# Patient Record
Sex: Female | Born: 1990 | Race: White | Hispanic: No | Marital: Married | Smoking: Never smoker
Health system: Southern US, Community
[De-identification: ages and names within clinical notes are randomized; demographics above are authoritative.]

## PROBLEM LIST (undated history)

## (undated) DIAGNOSIS — Z9889 Other specified postprocedural states: Secondary | ICD-10-CM

## (undated) DIAGNOSIS — K209 Esophagitis, unspecified without bleeding: Secondary | ICD-10-CM

## (undated) DIAGNOSIS — M1990 Unspecified osteoarthritis, unspecified site: Secondary | ICD-10-CM

## (undated) DIAGNOSIS — F32A Depression, unspecified: Secondary | ICD-10-CM

## (undated) DIAGNOSIS — T40605A Adverse effect of unspecified narcotics, initial encounter: Secondary | ICD-10-CM

## (undated) DIAGNOSIS — I499 Cardiac arrhythmia, unspecified: Secondary | ICD-10-CM

## (undated) DIAGNOSIS — E282 Polycystic ovarian syndrome: Secondary | ICD-10-CM

## (undated) DIAGNOSIS — J45909 Unspecified asthma, uncomplicated: Secondary | ICD-10-CM

## (undated) DIAGNOSIS — S60519A Abrasion of unspecified hand, initial encounter: Secondary | ICD-10-CM

## (undated) DIAGNOSIS — I7300 Raynaud's syndrome without gangrene: Secondary | ICD-10-CM

## (undated) DIAGNOSIS — Z889 Allergy status to unspecified drugs, medicaments and biological substances status: Secondary | ICD-10-CM

## (undated) DIAGNOSIS — T4145XA Adverse effect of unspecified anesthetic, initial encounter: Secondary | ICD-10-CM

## (undated) DIAGNOSIS — R740 Nonspecific elevation of levels of transaminase and lactic acid dehydrogenase [LDH]: Secondary | ICD-10-CM

## (undated) DIAGNOSIS — G5620 Lesion of ulnar nerve, unspecified upper limb: Secondary | ICD-10-CM

## (undated) DIAGNOSIS — F419 Anxiety disorder, unspecified: Secondary | ICD-10-CM

## (undated) DIAGNOSIS — E782 Mixed hyperlipidemia: Secondary | ICD-10-CM

## (undated) DIAGNOSIS — I471 Supraventricular tachycardia, unspecified: Secondary | ICD-10-CM

## (undated) DIAGNOSIS — G2581 Restless legs syndrome: Secondary | ICD-10-CM

## (undated) DIAGNOSIS — R112 Nausea with vomiting, unspecified: Secondary | ICD-10-CM

## (undated) DIAGNOSIS — Z8719 Personal history of other diseases of the digestive system: Secondary | ICD-10-CM

## (undated) DIAGNOSIS — E669 Obesity, unspecified: Secondary | ICD-10-CM

## (undated) DIAGNOSIS — E7800 Pure hypercholesterolemia, unspecified: Secondary | ICD-10-CM

## (undated) DIAGNOSIS — Z9109 Other allergy status, other than to drugs and biological substances: Secondary | ICD-10-CM

## (undated) DIAGNOSIS — F329 Major depressive disorder, single episode, unspecified: Secondary | ICD-10-CM

## (undated) DIAGNOSIS — K219 Gastro-esophageal reflux disease without esophagitis: Secondary | ICD-10-CM

## (undated) DIAGNOSIS — M25559 Pain in unspecified hip: Secondary | ICD-10-CM

## (undated) DIAGNOSIS — T8859XA Other complications of anesthesia, initial encounter: Secondary | ICD-10-CM

## (undated) DIAGNOSIS — O24419 Gestational diabetes mellitus in pregnancy, unspecified control: Secondary | ICD-10-CM

## (undated) HISTORY — DX: Gestational diabetes mellitus in pregnancy, unspecified control: O24.419

## (undated) HISTORY — DX: Mixed hyperlipidemia: E78.2

## (undated) HISTORY — DX: Esophagitis, unspecified without bleeding: K20.90

## (undated) HISTORY — PX: IUD REMOVAL: SHX5392

## (undated) HISTORY — PX: A-FLUTTER ABLATION: EP1230

## (undated) HISTORY — PX: ABLATION: SHX5711

## (undated) HISTORY — DX: Nonspecific elevation of levels of transaminase and lactic acid dehydrogenase (ldh): R74.0

## (undated) HISTORY — DX: Obesity, unspecified: E66.9

## (undated) HISTORY — DX: Polycystic ovarian syndrome: E28.2

## (undated) HISTORY — PX: INTRAUTERINE DEVICE (IUD) INSERTION: SHX5877

---

## 2011-07-07 HISTORY — PX: HIP SURGERY: SHX245

## 2013-07-26 ENCOUNTER — Other Ambulatory Visit: Payer: Self-pay | Admitting: Orthopedic Surgery

## 2013-08-31 DIAGNOSIS — M25559 Pain in unspecified hip: Secondary | ICD-10-CM

## 2013-08-31 DIAGNOSIS — G5620 Lesion of ulnar nerve, unspecified upper limb: Secondary | ICD-10-CM

## 2013-08-31 HISTORY — DX: Pain in unspecified hip: M25.559

## 2013-08-31 HISTORY — DX: Lesion of ulnar nerve, unspecified upper limb: G56.20

## 2013-09-09 ENCOUNTER — Encounter (HOSPITAL_BASED_OUTPATIENT_CLINIC_OR_DEPARTMENT_OTHER): Payer: Self-pay | Admitting: *Deleted

## 2013-09-09 DIAGNOSIS — S60519A Abrasion of unspecified hand, initial encounter: Secondary | ICD-10-CM

## 2013-09-09 HISTORY — DX: Abrasion of unspecified hand, initial encounter: S60.519A

## 2013-09-09 NOTE — Pre-Procedure Instructions (Addendum)
Newly diagnosed SVT discussed with Dr. Sampson GoonFitzgerald; pt. needs to finish with cardiac workup prior to having elbow surgery.  [facility] at [facility] notified.  Pt. notified of same.

## 2013-09-11 NOTE — Pre-Procedure Instructions (Signed)
Echo received and discussed with Dr. Sampson GoonFitzgerald; would like to know if pt. is still having episodes of SVT on the beta blocker; pt. contacted, and states she has not had any episodes since she was seen in the ER and started on Metoprolol 08/15/2013.  Pt. OK to come for surgery.

## 2013-09-12 ENCOUNTER — Encounter (HOSPITAL_BASED_OUTPATIENT_CLINIC_OR_DEPARTMENT_OTHER)
Admission: RE | Admit: 2013-09-12 | Discharge: 2013-09-12 | Disposition: A | Discharge status: Home / Self Care | Payer: 59 | Source: Ambulatory Visit | Attending: Orthopedic Surgery | Admitting: Orthopedic Surgery

## 2013-09-12 DIAGNOSIS — F3289 Other specified depressive episodes: Secondary | ICD-10-CM | POA: Diagnosis not present

## 2013-09-12 DIAGNOSIS — F411 Generalized anxiety disorder: Secondary | ICD-10-CM | POA: Diagnosis not present

## 2013-09-12 DIAGNOSIS — Z79899 Other long term (current) drug therapy: Secondary | ICD-10-CM | POA: Diagnosis not present

## 2013-09-12 DIAGNOSIS — K219 Gastro-esophageal reflux disease without esophagitis: Secondary | ICD-10-CM | POA: Diagnosis not present

## 2013-09-12 DIAGNOSIS — G5620 Lesion of ulnar nerve, unspecified upper limb: Secondary | ICD-10-CM | POA: Diagnosis present

## 2013-09-12 DIAGNOSIS — F329 Major depressive disorder, single episode, unspecified: Secondary | ICD-10-CM | POA: Diagnosis not present

## 2013-09-12 DIAGNOSIS — K449 Diaphragmatic hernia without obstruction or gangrene: Secondary | ICD-10-CM | POA: Diagnosis not present

## 2013-09-12 LAB — BASIC METABOLIC PANEL
ANION GAP: 11 (ref 5–15)
BUN: 13 mg/dL (ref 6–23)
CALCIUM: 9.5 mg/dL (ref 8.4–10.5)
CHLORIDE: 102 meq/L (ref 96–112)
CO2: 27 mEq/L (ref 19–32)
CREATININE: 0.71 mg/dL (ref 0.50–1.10)
GFR calc Af Amer: >90 mL/min (ref >90)
GFR calc non Af Amer: >90 mL/min (ref >90)
GLUCOSE: 108 mg/dL — AB (ref 70–99)
Potassium: 4.9 mEq/L (ref 3.7–5.3)
Sodium: 140 mEq/L (ref 137–147)

## 2013-09-12 NOTE — Progress Notes (Signed)
Dr. Krista BlueSinger talked with patient. Dr. Sampson GoonFitzgerald reviewed ER report from [facility] [place] for [place]

## 2013-09-13 ENCOUNTER — Ambulatory Visit (HOSPITAL_BASED_OUTPATIENT_CLINIC_OR_DEPARTMENT_OTHER)
Admission: RE | Admit: 2013-09-13 | Discharge: 2013-09-13 | Disposition: A | Discharge status: Home / Self Care | Payer: 59 | Source: Ambulatory Visit | Attending: Orthopedic Surgery | Admitting: Orthopedic Surgery

## 2013-09-13 ENCOUNTER — Encounter (HOSPITAL_BASED_OUTPATIENT_CLINIC_OR_DEPARTMENT_OTHER): Payer: Self-pay | Admitting: *Deleted

## 2013-09-13 ENCOUNTER — Encounter (HOSPITAL_BASED_OUTPATIENT_CLINIC_OR_DEPARTMENT_OTHER): Payer: 59 | Admitting: Anesthesiology

## 2013-09-13 ENCOUNTER — Encounter (HOSPITAL_BASED_OUTPATIENT_CLINIC_OR_DEPARTMENT_OTHER)
Admission: RE | Disposition: A | Discharge status: Home / Self Care | Payer: Self-pay | Source: Ambulatory Visit | Attending: Orthopedic Surgery

## 2013-09-13 ENCOUNTER — Ambulatory Visit (HOSPITAL_BASED_OUTPATIENT_CLINIC_OR_DEPARTMENT_OTHER): Payer: 59 | Admitting: Anesthesiology

## 2013-09-13 DIAGNOSIS — K449 Diaphragmatic hernia without obstruction or gangrene: Secondary | ICD-10-CM | POA: Insufficient documentation

## 2013-09-13 DIAGNOSIS — Z79899 Other long term (current) drug therapy: Secondary | ICD-10-CM | POA: Insufficient documentation

## 2013-09-13 DIAGNOSIS — F3289 Other specified depressive episodes: Secondary | ICD-10-CM | POA: Insufficient documentation

## 2013-09-13 DIAGNOSIS — F329 Major depressive disorder, single episode, unspecified: Secondary | ICD-10-CM | POA: Insufficient documentation

## 2013-09-13 DIAGNOSIS — F411 Generalized anxiety disorder: Secondary | ICD-10-CM | POA: Insufficient documentation

## 2013-09-13 DIAGNOSIS — K219 Gastro-esophageal reflux disease without esophagitis: Secondary | ICD-10-CM | POA: Insufficient documentation

## 2013-09-13 DIAGNOSIS — G5620 Lesion of ulnar nerve, unspecified upper limb: Secondary | ICD-10-CM | POA: Diagnosis not present

## 2013-09-13 HISTORY — PX: ULNAR NERVE TRANSPOSITION: SHX2595

## 2013-09-13 HISTORY — DX: Other complications of anesthesia, initial encounter: T88.59XA

## 2013-09-13 HISTORY — DX: Personal history of other diseases of the digestive system: Z87.19

## 2013-09-13 HISTORY — DX: Anxiety disorder, unspecified: F41.9

## 2013-09-13 HISTORY — DX: Pain in unspecified hip: M25.559

## 2013-09-13 HISTORY — DX: Lesion of ulnar nerve, unspecified upper limb: G56.20

## 2013-09-13 HISTORY — DX: Major depressive disorder, single episode, unspecified: F32.9

## 2013-09-13 HISTORY — DX: Other specified postprocedural states: Z98.890

## 2013-09-13 HISTORY — DX: Gastro-esophageal reflux disease without esophagitis: K21.9

## 2013-09-13 HISTORY — DX: Adverse effect of unspecified narcotics, initial encounter: T40.605A

## 2013-09-13 HISTORY — DX: Abrasion of unspecified hand, initial encounter: S60.519A

## 2013-09-13 HISTORY — DX: Supraventricular tachycardia, unspecified: I47.10

## 2013-09-13 HISTORY — DX: Nausea with vomiting, unspecified: R11.2

## 2013-09-13 HISTORY — DX: Supraventricular tachycardia: I47.1

## 2013-09-13 HISTORY — DX: Allergy status to unspecified drugs, medicaments and biological substances: Z88.9

## 2013-09-13 HISTORY — DX: Adverse effect of unspecified anesthetic, initial encounter: T41.45XA

## 2013-09-13 HISTORY — DX: Pure hypercholesterolemia, unspecified: E78.00

## 2013-09-13 HISTORY — DX: Other allergy status, other than to drugs and biological substances: Z91.09

## 2013-09-13 HISTORY — DX: Restless legs syndrome: G25.81

## 2013-09-13 HISTORY — DX: Cardiac arrhythmia, unspecified: I49.9

## 2013-09-13 HISTORY — DX: Depression, unspecified: F32.A

## 2013-09-13 LAB — POCT HEMOGLOBIN-HEMACUE: Hemoglobin: 14.1 g/dL (ref 12.0–15.0)

## 2013-09-13 SURGERY — ULNAR NERVE DECOMPRESSION/TRANSPOSITION
Anesthesia: Regional | Site: Elbow | Laterality: Right

## 2013-09-13 MED ORDER — LACTATED RINGERS IV SOLN
INTRAVENOUS | Status: DC
  Administered 2013-09-13 (×2): via INTRAVENOUS

## 2013-09-13 MED ORDER — HYDROMORPHONE HCL PF 1 MG/ML IJ SOLN
INTRAMUSCULAR | Status: AC
  Filled 2013-09-13: qty 1

## 2013-09-13 MED ORDER — LIDOCAINE HCL (CARDIAC) 20 MG/ML IV SOLN
INTRAVENOUS | Status: DC | PRN
  Administered 2013-09-13: 75 mg via INTRAVENOUS

## 2013-09-13 MED ORDER — FENTANYL CITRATE 0.05 MG/ML IJ SOLN
INTRAMUSCULAR | Status: DC | PRN
  Administered 2013-09-13 (×2): 25 ug via INTRAVENOUS
  Administered 2013-09-13 (×2): 50 ug via INTRAVENOUS

## 2013-09-13 MED ORDER — MIDAZOLAM HCL 2 MG/2ML IJ SOLN
1.0000 mg | INTRAMUSCULAR | Status: DC | PRN
  Administered 2013-09-13: 2 mg via INTRAVENOUS

## 2013-09-13 MED ORDER — MIDAZOLAM HCL 5 MG/5ML IJ SOLN
INTRAMUSCULAR | Status: DC | PRN
  Administered 2013-09-13: 2 mg via INTRAVENOUS

## 2013-09-13 MED ORDER — SODIUM CHLORIDE 0.45 % IV SOLN: INTRAVENOUS | Status: DC

## 2013-09-13 MED ORDER — TAPENTADOL HCL 50 MG PO TABS: 100.0000 mg | ORAL_TABLET | ORAL | Status: DC | PRN

## 2013-09-13 MED ORDER — METHOCARBAMOL 500 MG PO TABS
500.0000 mg | ORAL_TABLET | Freq: Once | ORAL | Status: AC
  Administered 2013-09-13: 500 mg via ORAL
  Filled 2013-09-13: qty 1

## 2013-09-13 MED ORDER — PROMETHAZINE HCL 25 MG/ML IJ SOLN
INTRAMUSCULAR | Status: AC
  Filled 2013-09-13: qty 1

## 2013-09-13 MED ORDER — METHOCARBAMOL 500 MG PO TABS: 500.0000 mg | ORAL_TABLET | Freq: Four times a day (QID) | ORAL | Status: DC

## 2013-09-13 MED ORDER — PROPOFOL 10 MG/ML IV BOLUS
INTRAVENOUS | Status: DC | PRN
  Administered 2013-09-13: 200 mg via INTRAVENOUS

## 2013-09-13 MED ORDER — HYDROMORPHONE HCL PF 1 MG/ML IJ SOLN
0.2500 mg | INTRAMUSCULAR | Status: DC | PRN
  Administered 2013-09-13 (×2): 0.25 mg via INTRAVENOUS

## 2013-09-13 MED ORDER — CHLORHEXIDINE GLUCONATE 4 % EX LIQD: 60.0000 mL | Freq: Once | CUTANEOUS | Status: DC

## 2013-09-13 MED ORDER — DEXAMETHASONE SODIUM PHOSPHATE 10 MG/ML IJ SOLN
INTRAMUSCULAR | Status: DC | PRN
  Administered 2013-09-13: 10 mg via INTRAVENOUS

## 2013-09-13 MED ORDER — MIDAZOLAM HCL 2 MG/2ML IJ SOLN
INTRAMUSCULAR | Status: AC
  Filled 2013-09-13: qty 2

## 2013-09-13 MED ORDER — BUPIVACAINE-EPINEPHRINE (PF) 0.5% -1:200000 IJ SOLN
INTRAMUSCULAR | Status: DC | PRN
  Administered 2013-09-13: 30 mL via PERINEURAL

## 2013-09-13 MED ORDER — FENTANYL CITRATE 0.05 MG/ML IJ SOLN
50.0000 ug | INTRAMUSCULAR | Status: DC | PRN
  Administered 2013-09-13: 100 ug via INTRAVENOUS

## 2013-09-13 MED ORDER — SCOPOLAMINE 1 MG/3DAYS TD PT72
MEDICATED_PATCH | TRANSDERMAL | Status: AC
  Filled 2013-09-13: qty 1

## 2013-09-13 MED ORDER — ONDANSETRON HCL 4 MG/2ML IJ SOLN: 4.0000 mg | Freq: Four times a day (QID) | INTRAMUSCULAR | Status: DC | PRN

## 2013-09-13 MED ORDER — ONDANSETRON HCL 4 MG/2ML IJ SOLN
INTRAMUSCULAR | Status: DC | PRN
  Administered 2013-09-13: 4 mg via INTRAVENOUS

## 2013-09-13 MED ORDER — PROMETHAZINE HCL 25 MG/ML IJ SOLN
6.2500 mg | Freq: Once | INTRAMUSCULAR | Status: AC
  Administered 2013-09-13: 6.25 mg via INTRAVENOUS

## 2013-09-13 MED ORDER — SCOPOLAMINE 1 MG/3DAYS TD PT72
1.0000 | MEDICATED_PATCH | Freq: Once | TRANSDERMAL | Status: DC
  Administered 2013-09-13: 1.5 mg via TRANSDERMAL

## 2013-09-13 MED ORDER — CEFAZOLIN SODIUM-DEXTROSE 2-3 GM-% IV SOLR: 2.0000 g | INTRAVENOUS | Status: DC

## 2013-09-13 MED ORDER — FENTANYL CITRATE 0.05 MG/ML IJ SOLN
INTRAMUSCULAR | Status: AC
  Filled 2013-09-13: qty 2

## 2013-09-13 MED ORDER — MIDAZOLAM HCL 2 MG/ML PO SYRP: 12.0000 mg | ORAL_SOLUTION | Freq: Once | ORAL | Status: DC | PRN

## 2013-09-13 MED ORDER — CEFAZOLIN SODIUM-DEXTROSE 2-3 GM-% IV SOLR
INTRAVENOUS | Status: AC
  Filled 2013-09-13: qty 50

## 2013-09-13 MED ORDER — FENTANYL CITRATE 0.05 MG/ML IJ SOLN
INTRAMUSCULAR | Status: AC
  Filled 2013-09-13: qty 4

## 2013-09-13 SURGICAL SUPPLY — 72 items
BANDAGE ELASTIC 3 VELCRO ST LF (GAUZE/BANDAGES/DRESSINGS) ×4 IMPLANT
BANDAGE ELASTIC 4 VELCRO ST LF (GAUZE/BANDAGES/DRESSINGS) IMPLANT
BANDAGE ELASTIC 6 VELCRO ST LF (GAUZE/BANDAGES/DRESSINGS) ×2 IMPLANT
BLADE CLIPPER SURG (BLADE) IMPLANT
BLADE SURG 15 STRL LF DISP TIS (BLADE) ×3 IMPLANT
BLADE SURG 15 STRL SS (BLADE) ×3
BNDG CONFORM 3 STRL LF (GAUZE/BANDAGES/DRESSINGS) ×2 IMPLANT
BNDG GAUZE ELAST 4 BULKY (GAUZE/BANDAGES/DRESSINGS) ×2 IMPLANT
BRUSH SCRUB EZ PLAIN DRY (MISCELLANEOUS) ×2 IMPLANT
CANISTER SUCT 1200ML W/VALVE (MISCELLANEOUS) IMPLANT
CORDS BIPOLAR (ELECTRODE) ×2 IMPLANT
COVER MAYO STAND STRL (DRAPES) ×2 IMPLANT
COVER TABLE BACK 60X90 (DRAPES) ×2 IMPLANT
CUFF TOURNIQUET SINGLE 18IN (TOURNIQUET CUFF) ×2 IMPLANT
DECANTER SPIKE VIAL GLASS SM (MISCELLANEOUS) IMPLANT
DRAPE EXTREMITY T 121X128X90 (DRAPE) ×2 IMPLANT
DRAPE INCISE IOBAN 66X45 STRL (DRAPES) IMPLANT
DRAPE SURG 17X23 STRL (DRAPES) ×2 IMPLANT
DRSG EMULSION OIL 3X3 NADH (GAUZE/BANDAGES/DRESSINGS) ×2 IMPLANT
EVACUATOR 1/8 PVC DRAIN (DRAIN) IMPLANT
EVACUATOR 3/16  PVC DRAIN (DRAIN)
EVACUATOR 3/16 PVC DRAIN (DRAIN) IMPLANT
EVACUATOR SILICONE 100CC (DRAIN) IMPLANT
GAUZE SPONGE 4X4 12PLY STRL (GAUZE/BANDAGES/DRESSINGS) ×2 IMPLANT
GAUZE SPONGE 4X4 16PLY XRAY LF (GAUZE/BANDAGES/DRESSINGS) IMPLANT
GAUZE XEROFORM 1X8 LF (GAUZE/BANDAGES/DRESSINGS) IMPLANT
GLOVE BIO SURGEON STRL SZ8 (GLOVE) ×2 IMPLANT
GLOVE BIOGEL M STRL SZ7.5 (GLOVE) IMPLANT
GLOVE BIOGEL PI IND STRL 7.0 (GLOVE) ×1 IMPLANT
GLOVE BIOGEL PI INDICATOR 7.0 (GLOVE) ×1
GLOVE ECLIPSE 6.5 STRL STRAW (GLOVE) ×2 IMPLANT
GLOVE SS BIOGEL STRL SZ 8 (GLOVE) ×2 IMPLANT
GLOVE SUPERSENSE BIOGEL SZ 8 (GLOVE) ×2
GOWN STRL REUS W/ TWL LRG LVL3 (GOWN DISPOSABLE) ×1 IMPLANT
GOWN STRL REUS W/ TWL XL LVL3 (GOWN DISPOSABLE) ×1 IMPLANT
GOWN STRL REUS W/TWL LRG LVL3 (GOWN DISPOSABLE) ×1
GOWN STRL REUS W/TWL XL LVL3 (GOWN DISPOSABLE) ×1
LOOP VESSEL MAXI BLUE (MISCELLANEOUS) ×2 IMPLANT
NEEDLE HYPO 22GX1.5 SAFETY (NEEDLE) IMPLANT
NEEDLE HYPO 25X1 1.5 SAFETY (NEEDLE) ×2 IMPLANT
NS IRRIG 1000ML POUR BTL (IV SOLUTION) ×2 IMPLANT
PACK BASIN DAY SURGERY FS (CUSTOM PROCEDURE TRAY) ×2 IMPLANT
PAD CAST 3X4 CTTN HI CHSV (CAST SUPPLIES) ×2 IMPLANT
PADDING CAST ABS 4INX4YD NS (CAST SUPPLIES) ×1
PADDING CAST ABS COTTON 4X4 ST (CAST SUPPLIES) ×1 IMPLANT
PADDING CAST COTTON 3X4 STRL (CAST SUPPLIES) ×2
SHEET MEDIUM DRAPE 40X70 STRL (DRAPES) IMPLANT
SPLINT FAST PLASTER 5X30 (CAST SUPPLIES)
SPLINT FIBERGLASS 4X30 (CAST SUPPLIES) ×2 IMPLANT
SPLINT PLASTER CAST FAST 5X30 (CAST SUPPLIES) IMPLANT
SPLINT PLASTER CAST XFAST 3X15 (CAST SUPPLIES) IMPLANT
SPLINT PLASTER XTRA FASTSET 3X (CAST SUPPLIES)
SPONGE LAP 4X18 X RAY DECT (DISPOSABLE) IMPLANT
STOCKINETTE 4X48 STRL (DRAPES) ×2 IMPLANT
STOCKINETTE SYNTHETIC 3 UNSTER (CAST SUPPLIES) ×2 IMPLANT
STRIP CLOSURE SKIN 1/2X4 (GAUZE/BANDAGES/DRESSINGS) ×2 IMPLANT
SUCTION FRAZIER TIP 10 FR DISP (SUCTIONS) IMPLANT
SUT BONE WAX W31G (SUTURE) IMPLANT
SUT FIBERWIRE 2-0 18 17.9 3/8 (SUTURE)
SUT PROLENE 4 0 PS 2 18 (SUTURE) ×4 IMPLANT
SUT VIC AB 3-0 FS2 27 (SUTURE) ×4 IMPLANT
SUT VIC AB 4-0 P-3 18XBRD (SUTURE) IMPLANT
SUT VIC AB 4-0 P3 18 (SUTURE)
SUTURE FIBERWR 2-0 18 17.9 3/8 (SUTURE) IMPLANT
SYR BULB 3OZ (MISCELLANEOUS) ×2 IMPLANT
SYR CONTROL 10ML LL (SYRINGE) ×2 IMPLANT
TAPE SURG TRANSPORE 1 IN (GAUZE/BANDAGES/DRESSINGS) ×1 IMPLANT
TAPE SURGICAL TRANSPORE 1 IN (GAUZE/BANDAGES/DRESSINGS) ×1
TOWEL OR 17X24 6PK STRL BLUE (TOWEL DISPOSABLE) ×4 IMPLANT
TOWEL OR NON WOVEN STRL DISP B (DISPOSABLE) ×2 IMPLANT
TUBE CONNECTING 20X1/4 (TUBING) IMPLANT
UNDERPAD 30X30 INCONTINENT (UNDERPADS AND DIAPERS) ×2 IMPLANT

## 2013-09-13 NOTE — Progress Notes (Signed)
Assisted Dr. Hodierne with right, ultrasound guided, interscalene  block. Side rails up, monitors on throughout procedure. See vital signs in flow sheet. Tolerated Procedure well. 

## 2013-09-13 NOTE — Anesthesia Procedure Notes (Addendum)
Anesthesia Regional Block:  Supraclavicular block  Pre-Anesthetic Checklist: ,, timeout performed, Correct Patient, Correct Site, Correct Laterality, Correct Procedure, Correct Position, site marked, Risks and benefits discussed,  Surgical consent,  Pre-op evaluation,  At surgeon's request and post-op pain management  Laterality: Right  Prep: chloraprep       Needles:  Injection technique: Single-shot  Needle Type: Echogenic Stimulator Needle     Needle Length: 5cm 5 cm Needle Gauge: 22 and 22 G    Additional Needles:  Procedures: ultrasound guided (picture in chart) and nerve stimulator Supraclavicular block  Nerve Stimulator or Paresthesia:  Response: biceps flexion, 0.45 mA,   Additional Responses:   Narrative:  Start time: 09/13/2013 8:51 AM End time: 09/13/2013 9:00 AM Injection made incrementally with aspirations every 5 mL.  Performed by: Personally  Anesthesiologist: Dr Chaney MallingHodierne  Additional Notes: Functioning IV was confirmed and monitors were applied.  A 50mm 22ga Arrow echogenic stimulator needle was used. Sterile prep and drape,hand hygiene and sterile gloves were used.  Negative aspiration and negative test dose prior to incremental administration of local anesthetic. The patient tolerated the procedure well.  Ultrasound guidance: relevent anatomy identified, needle position confirmed, local anesthetic spread visualized around nerve(s), vascular puncture avoided.  Image printed for medical record.    Procedure Name: LMA Insertion Date/Time: 09/13/2013 9:34 AM Performed by: Gar GibbonKEETON, Skye Plamondon S Pre-anesthesia Checklist: Patient identified, Emergency Drugs available, Suction available and Patient being monitored Patient Re-evaluated:Patient Re-evaluated prior to inductionOxygen Delivery Method: Circle System Utilized Preoxygenation: Pre-oxygenation with 100% oxygen Intubation Type: IV induction Ventilation: Mask ventilation without difficulty LMA: LMA  inserted LMA Size: 4.0 Number of attempts: 1 Airway Equipment and Method: bite block Placement Confirmation: positive ETCO2 Tube secured with: Tape Dental Injury: Teeth and Oropharynx as per pre-operative assessment

## 2013-09-13 NOTE — Discharge Instructions (Signed)
Please call for any problems Please keep your bandage clean and dry Take meds as  needed to for pain  Dr. Carlos LeveringGramig's office will call for your followup  Keep bandage clean and dry.  Call for any problems.  No smoking.  Criteria for driving a car: you should be off your pain medicine for 7-8 hours, able to drive one handed(confident), thinking clearly and feeling able in your judgement to drive. Continue elevation as it will decrease swelling.  If instructed by MD move your fingers within the confines of the bandage/splint.  Use ice if instructed by your MD. Call immediately for any sudden loss of feeling in your hand/arm or change in functional abilities of the extremity.  We recommend that you to take vitamin C 1000 mg a day to promote healing we also recommend that if you require her pain medicine that he take a stool softener to prevent constipation as most pain medicines will have constipation side effects. We recommend either Peri-Colace or Senokot and recommend that you also consider adding MiraLAX to prevent the constipation affects from pain medicine if you are required to use them. These medicines are over the counter and maybe purchased at a local pharmacy.   Post Anesthesia Home Care Instructions  Activity: Get plenty of rest for the remainder of the day. A responsible adult should stay with you for 24 hours following the procedure.  For the next 24 hours, DO NOT: -Drive a car -Advertising copywriterperate machinery -Drink alcoholic beverages -Take any medication unless instructed by your physician -Make any legal decisions or sign important papers.  Meals: Start with liquid foods such as gelatin or soup. Progress to regular foods as tolerated. Avoid greasy, spicy, heavy foods. If nausea and/or vomiting occur, drink only clear liquids until the nausea and/or vomiting subsides. Call your physician if vomiting continues.  Special Instructions/Symptoms: Your throat may feel dry or sore from the anesthesia  or the breathing tube placed in your throat during surgery. If this causes discomfort, gargle with warm salt water. The discomfort should disappear within 24 hours.   Regional Anesthesia Blocks  1. Numbness or the inability to move the "blocked" extremity may last from 3-48 hours after placement. The length of time depends on the medication injected and your individual response to the medication. If the numbness is not going away after 48 hours, call your surgeon.  2. The extremity that is blocked will need to be protected until the numbness is gone and the  Strength has returned. Because you cannot feel it, you will need to take extra care to avoid injury. Because it may be weak, you may have difficulty moving it or using it. You may not know what position it is in without looking at it while the block is in effect.  3. For blocks in the legs and feet, returning to weight bearing and walking needs to be done carefully. You will need to wait until the numbness is entirely gone and the strength has returned. You should be able to move your leg and foot normally before you try and bear weight or walk. You will need someone to be with you when you first try to ensure you do not fall and possibly risk injury.  4. Bruising and tenderness at the needle site are common side effects and will resolve in a few days.  5. Persistent numbness or new problems with movement should be communicated to the surgeon or the [facility]  [phone])/ [facility] [phone]).

## 2013-09-13 NOTE — H&P (Signed)
Christine Schneider is an 23 y.o. female.   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 I've counseled her extensively in regards to the surgical endeavors which will comprise of anterior transposition and release of the ulnar nerve with flexor pronator fascial release  All issues have been discussed. She denies neck back chest or abdominal pain.  Past Medical History  Diagnosis Date  . Anxiety   . Depression   . SVT (supraventricular tachycardia)   . H/O hiatal hernia   . Multiple allergies     states is allergic to everything except ragweed and beef  . Pollen allergy   . High cholesterol     no current med.  . Complication of anesthesia     hair loss after anesthesia  . Restless leg syndrome   . Abrasion of hand 09/09/2013    bilateral  . GERD (gastroesophageal reflux disease)     OTC as needed  . Narcotic-induced nausea and vomiting     states needs Phenergan if Percocet is prescribed  . 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 Hip joint pain 08/2013    right - states has a problem with labrum  . Dysrhythmia     svt  treated  . PONV (postoperative nausea and vomiting)     Past Surgical History  Procedure Laterality Date  . Hip surgery Right 07/07/2011    History reviewed. No pertinent family history. Social History:  reports that she has never smoked. She has never used smokeless tobacco. She reports that she drinks alcohol. She reports that she does not use illicit drugs.  Allergies:  Allergies  Allergen Reactions  . Percocet [Oxycodone-Acetaminophen] Nausea And Vomiting    Medications Prior to Admission  Medication Sig Dispense Refill  . levocetirizine (XYZAL) 5 MG tablet Take 5 mg by mouth every evening.      Marland Kitchen LORazepam (ATIVAN) 1 MG tablet Take 1 mg by mouth every 8 (eight) hours.      . metoprolol succinate  (TOPROL-XL) 25 MG 24 hr tablet Take 25 mg by mouth daily.      . Multiple Vitamin (MULTIVITAMIN) tablet Take 1 tablet by mouth daily.      . pramipexole (MIRAPEX) 0.5 MG tablet Take 0.5 mg by mouth daily.      Marland Kitchen venlafaxine (EFFEXOR) 100 MG tablet Take 100 mg by mouth 2 (two) times daily.      . vitamin B-12 (CYANOCOBALAMIN) 100 MCG tablet Take 100 mcg by mouth daily.        Results for orders placed during the hospital encounter of 09/13/13 (from the past 48 hour(s))  BASIC METABOLIC PANEL     Status: Abnormal   Collection Time    09/12/13  9:00 AM      Result Value Ref Range   Sodium 140  137 - 147 mEq/L   Potassium 4.9  3.7 - 5.3 mEq/L   Chloride 102  96 - 112 mEq/L   CO2 27  19 - 32 mEq/L   Glucose, Bld 108 (*) 70 - 99 mg/dL   BUN 13  6 - 23 mg/dL   Creatinine, Ser 0.71  0.50 - 1.10 mg/dL   Calcium 9.5  8.4 - 10.5 mg/dL   GFR calc non Af Amer >90  >90 mL/min   GFR calc Af Amer >90  >90 mL/min   Comment: (NOTE)  The eGFR has been calculated using the CKD EPI equation.     This calculation has not been validated in all clinical situations.     eGFR's persistently <90 mL/min signify possible Chronic Kidney     Disease.   Anion gap 11  5 - 15  POCT HEMOGLOBIN-HEMACUE     Status: None   Collection Time    09/13/13  8:48 AM      Result Value Ref Range   Hemoglobin 14.1  12.0 - 15.0 g/dL   No results found.  Review of Systems  Cardiovascular: Negative.   Gastrointestinal: Negative.   Genitourinary: Negative.   Endo/Heme/Allergies: Negative.     Blood pressure 133/75, pulse 97, temperature 98.5 F (36.9 C), temperature source Oral, resp. rate 20, height 5' 4"  (1.626 m), weight 94.348 kg (208 lb), last menstrual period 08/21/2013, SpO2 99.00%. Physical Exam right ulnar nerve subluxation at the elbow with notable pain and ulnar nerve neuropathy  She has intact pulses she has intact refill she has no signs of compartment syndrome dystrophy or infection.  I reviewed all  issues with her at length.  The patient is alert and oriented in no acute distress the patient complains of pain in the affected upper extremity.  The patient is noted to have a normal HEENT exam.  Lung fields show equal chest expansion and no shortness of breath  abdomen exam is nontender without distention.  Lower extremity examination does not show any fracture dislocation or blood clot symptoms.  Pelvis is stable neck and back are stable and nontender  Assessment/Plan We will plan for right ulnar nerve decompression anterior transposition and fascial release is necessary.  We are planning surgery for your upper extremity. The risk and benefits of surgery include risk of bleeding infection anesthesia damage to normal structures and failure of the surgery to accomplish its intended goals of relieving symptoms and restoring function with this in mind we'll going to proceed. I have specifically discussed with the patient the pre-and postoperative regime and the does and don'ts and risk and benefits in great detail. Risk and benefits of surgery also include risk of dystrophy chronic nerve pain failure of the healing process to go onto completion and other inherent risks of surgery The relavent the pathophysiology of the disease/injury process, as well as the alternatives for treatment and postoperative course of action has been discussed in great detail with the patient who desires to proceed.  We will do everything in our power to help you (the patient) restore function to the upper extremity. Is a pleasure to see this patient today.   Paulene Floor 09/13/2013, 8:56 AM

## 2013-09-13 NOTE — Op Note (Signed)
See [id number] Christine PeaGramig MD

## 2013-09-13 NOTE — Transfer of Care (Signed)
Immediate Anesthesia Transfer of Care Note  Patient: Christine BuccoGabrielle A Schneider  Procedure(s) Performed: Procedure(s): RIGHT ELBOW ULNAR NERVE RELEASE ANTERIOR TRANSPOSITION WITH REPAIR AND RECONSTRUCTION  (Right)  Patient Location: PACU  Anesthesia Type:GA combined with regional for post-op pain  Level of Consciousness: sedated, patient cooperative and lethargic  Airway & Oxygen Therapy: Patient Spontanous Breathing and Patient connected to face mask oxygen  Post-op Assessment: Report given to PACU RN and Post -op Vital signs reviewed and stable  Post vital signs: Reviewed and stable  Complications: No apparent anesthesia complications

## 2013-09-13 NOTE — Anesthesia Preprocedure Evaluation (Signed)
Anesthesia Evaluation  Patient identified by MRN, date of birth, ID band Patient awake    Reviewed: Allergy & Precautions, H&P , NPO status , Patient's Chart, lab work & pertinent test results  Airway Mallampati: II  Neck ROM: full    Dental   Pulmonary          Cardiovascular negative cardio ROS      Neuro/Psych Anxiety Depression  Neuromuscular disease    GI/Hepatic hiatal hernia, GERD-  ,  Endo/Other  obese  Renal/GU      Musculoskeletal   Abdominal   Peds  Hematology   Anesthesia Other Findings   Reproductive/Obstetrics                           Anesthesia Physical Anesthesia Plan  ASA: II  Anesthesia Plan: General and Regional   Post-op Pain Management: MAC Combined w/ Regional for Post-op pain   Induction: Intravenous  Airway Management Planned: LMA  Additional Equipment:   Intra-op Plan:   Post-operative Plan:   Informed Consent: I have reviewed the patients History and Physical, chart, labs and discussed the procedure including the risks, benefits and alternatives for the proposed anesthesia with the patient or authorized representative who has indicated his/her understanding and acceptance.     Plan Discussed with: CRNA, Anesthesiologist and Surgeon  Anesthesia Plan Comments:         Anesthesia Quick Evaluation

## 2013-09-16 ENCOUNTER — Encounter (HOSPITAL_BASED_OUTPATIENT_CLINIC_OR_DEPARTMENT_OTHER): Payer: Self-pay | Admitting: Orthopedic Surgery

## 2013-09-20 NOTE — Anesthesia Postprocedure Evaluation (Signed)
Anesthesia Post Note  Patient: Christine BuccoGabrielle A Schneider  Procedure(s) Performed: Procedure(s) (LRB): RIGHT ELBOW ULNAR NERVE RELEASE ANTERIOR TRANSPOSITION WITH REPAIR AND RECONSTRUCTION  (Right)  Anesthesia type: General  Patient location: PACU  Post pain: Pain level controlled and Adequate analgesia  Post assessment: Post-op Vital signs reviewed, Patient's Cardiovascular Status Stable, Respiratory Function Stable, Patent Airway and Pain level controlled  Last Vitals:  Filed Vitals:   09/13/13 1245  BP: 121/74  Pulse: 81  Temp: 36.6 C  Resp: 18    Post vital signs: Reviewed and stable  Level of consciousness: awake, alert  and oriented  Complications: No apparent anesthesia complications

## 2013-09-23 NOTE — Op Note (Signed)
NAMERivka Schneider NO.:  [id number]  MEDICAL RECORD NO.:  [id number]  LOCATION:                                 FACILITY:  PHYSICIAN:  Christine Schneider, M.D.DATE OF BIRTH:  01/08/1991  DATE OF PROCEDURE:  09/13/2013 DATE OF DISCHARGE:  09/13/2013                              OPERATIVE REPORT   PREOPERATIVE DIAGNOSIS:  Right ulnar nerve neuropathy with subluxation, pain, and chronic neuropathy.  POSTOPERATIVE DIAGNOSIS:  Right ulnar nerve neuropathy with subluxation, pain, and chronic neuropathy.  PROCEDURE: 1. Ulnar nerve decompression and anterior transposition, right elbow     with neurolysis. 2. Flexor pronator release/lengthening with fascial sling creation,     right elbow.  SURGEON:  Christine Ano. Christine Schneider, M.D.  ASSISTANT:  None.  COMPLICATIONS:  None.  ANESTHESIA:  General.  DRAINS:  None.  INDICATIONS:  [name] who presents with the above-mentioned diagnosis.  I have discussed the risks and benefits of surgery including risk of infection, bleeding, anesthesia, damage to normal structures, and failure of surgery to accomplish its intended goals, relieving symptoms, and restoring function.  With this in mind, she desires to proceed.  All questions have been encouraged and answered preoperatively.  OPERATIVE PROCEDURE:  The patient was seen by myself and Anesthesia, taken to the operative suite.  Underwent smooth induction of general anesthesia, laid supine, fully padded, prepped and draped in a sterile fashion.  Betadine scrub and paint.  Time-out was called.  Sterile tourniquet applied and insufflated and curvilinear posteromedial incision was then made.  Following this, dissection was carried down.  Medial antebrachial cutaneous nerve was identified and protected at all times.  The ulnar nerve was released about the arcade of Struthers, medial intermuscular septum, cubital tunnel, Osborne ligament, and the 2 heads of the  FCU, both superficial and deep.  Following this, the nerve was immobilized on the epineurial plexus of vessels.  This was done with the vessel loop very carefully with facial nerve dissector.  Following this, medial and muscular septum was excised and a portion detached about the medial epicondyle release and then used to close the cubital tunnel and prevent a tendency towards posterior subluxation of the nerve.  Following this, I then performed a fascial lengthening.  This was a fascial lengthening of the flexor pronator fascia without difficulty.  The patient tolerated this well.  Once this was done, the fascial strip was created for an [employer] sling.  Following this, the patient then underwent a very careful and cautious approach to transposition.  The nerve was evaluated.  The tourniquet deflated.  Hemostasis secured.  Cubital tunnel sutured with 3- 0 Vicryl and the fascia sling was sutured to the subcutaneous fat with the nerve in an anterior transposed state, tension free, and gliding nicely.  I placed the patient through a full range of motion, all looked well.  I was pleased with the findings.  There were no complications. We irrigated copiously, closed the subcu with Vicryl, and the skin edge with subcuticular Prolene.  We will see her back in the office in 12 days, sutures out, new Steri- Strips apply, down to therapy, gentle interval range of motion will be instituted.  These notes had been discussed.  She tolerated the procedure well.  She will be discharged on Nucynta and Robaxin. Elevate, move, massage fingers.  Notify if same problems occur.  We look forward to participate in postop recovery.  This was an uneventful surgical endeavor, and the patient had a very significant area of inflammation around the nerve as well as subluxation tendencies.     Christine Ano. Christine Schneider, M.D.     [facility]  D:  09/13/2013  T:  09/13/2013  Job:  [id number]

## 2014-12-18 DIAGNOSIS — I483 Typical atrial flutter: Secondary | ICD-10-CM | POA: Insufficient documentation

## 2015-08-20 DIAGNOSIS — Z8679 Personal history of other diseases of the circulatory system: Secondary | ICD-10-CM | POA: Insufficient documentation

## 2015-09-30 DIAGNOSIS — F902 Attention-deficit hyperactivity disorder, combined type: Secondary | ICD-10-CM | POA: Insufficient documentation

## 2015-09-30 DIAGNOSIS — F332 Major depressive disorder, recurrent severe without psychotic features: Secondary | ICD-10-CM | POA: Insufficient documentation

## 2015-11-04 ENCOUNTER — Ambulatory Visit (INDEPENDENT_AMBULATORY_CARE_PROVIDER_SITE_OTHER): Payer: 59 | Admitting: Orthopaedic Surgery

## 2015-11-04 ENCOUNTER — Ambulatory Visit (INDEPENDENT_AMBULATORY_CARE_PROVIDER_SITE_OTHER): Payer: Self-pay | Admitting: Orthopaedic Surgery

## 2015-11-04 DIAGNOSIS — M25551 Pain in right hip: Secondary | ICD-10-CM

## 2015-11-05 ENCOUNTER — Other Ambulatory Visit (INDEPENDENT_AMBULATORY_CARE_PROVIDER_SITE_OTHER): Payer: Self-pay | Admitting: Orthopaedic Surgery

## 2015-11-05 DIAGNOSIS — M25551 Pain in right hip: Secondary | ICD-10-CM

## 2015-11-14 ENCOUNTER — Ambulatory Visit
Admission: RE | Admit: 2015-11-14 | Discharge: 2015-11-14 | Disposition: A | Discharge status: Home / Self Care | Payer: Self-pay | Source: Ambulatory Visit | Attending: Orthopaedic Surgery | Admitting: Orthopaedic Surgery

## 2015-11-14 DIAGNOSIS — M25551 Pain in right hip: Secondary | ICD-10-CM

## 2015-11-18 ENCOUNTER — Ambulatory Visit (INDEPENDENT_AMBULATORY_CARE_PROVIDER_SITE_OTHER): Payer: 59 | Admitting: Orthopaedic Surgery

## 2015-11-18 DIAGNOSIS — M25551 Pain in right hip: Secondary | ICD-10-CM | POA: Diagnosis not present

## 2015-11-18 DIAGNOSIS — M25552 Pain in left hip: Secondary | ICD-10-CM | POA: Diagnosis not present

## 2015-12-30 DIAGNOSIS — F3131 Bipolar disorder, current episode depressed, mild: Secondary | ICD-10-CM | POA: Insufficient documentation

## 2015-12-30 DIAGNOSIS — F451 Undifferentiated somatoform disorder: Secondary | ICD-10-CM | POA: Insufficient documentation

## 2016-05-27 ENCOUNTER — Ambulatory Visit (INDEPENDENT_AMBULATORY_CARE_PROVIDER_SITE_OTHER): Payer: 59 | Admitting: Family Medicine

## 2016-05-27 ENCOUNTER — Encounter: Payer: Self-pay | Admitting: Family Medicine

## 2016-05-27 VITALS — BP 122/72 | HR 92 | Temp 98.7°F | Resp 16 | Wt 206.0 lb

## 2016-05-27 DIAGNOSIS — F32A Depression, unspecified: Secondary | ICD-10-CM | POA: Insufficient documentation

## 2016-05-27 DIAGNOSIS — Z8679 Personal history of other diseases of the circulatory system: Secondary | ICD-10-CM | POA: Insufficient documentation

## 2016-05-27 DIAGNOSIS — Z7689 Persons encountering health services in other specified circumstances: Secondary | ICD-10-CM

## 2016-05-27 DIAGNOSIS — F988 Other specified behavioral and emotional disorders with onset usually occurring in childhood and adolescence: Secondary | ICD-10-CM | POA: Insufficient documentation

## 2016-05-27 DIAGNOSIS — J0100 Acute maxillary sinusitis, unspecified: Secondary | ICD-10-CM | POA: Diagnosis not present

## 2016-05-27 DIAGNOSIS — F419 Anxiety disorder, unspecified: Secondary | ICD-10-CM

## 2016-05-27 DIAGNOSIS — F329 Major depressive disorder, single episode, unspecified: Secondary | ICD-10-CM | POA: Insufficient documentation

## 2016-05-27 DIAGNOSIS — F319 Bipolar disorder, unspecified: Secondary | ICD-10-CM | POA: Insufficient documentation

## 2016-05-27 MED ORDER — AMOXICILLIN 875 MG PO TABS: 875.0000 mg | ORAL_TABLET | Freq: Two times a day (BID) | ORAL | 0 refills | Status: DC

## 2016-05-27 NOTE — Progress Notes (Signed)
Subjective: Chief Complaint  Patient presents with  . new pt    new pt, sinus infection- 1 week, wakes up every morning- congestion     Christine Schneider is a 26 y.o. female who presents for possible sinus infection and is 25.[redacted] weeks pregnant, due in August.  Symptoms include a 1 week history of thick purulent nasal discharge, nasal congestion, scratchy throat, and dry cough.  Denies fever, chills, ear pain, chest pain, palpitations, shortness of breath, abdominal pain, nausea, vomiting, diarrhea.   Past history is significant for allergies, sinus infection and bronchitis twice annually . Patient is a non-smoker.  Using Xyzal and benadryl for symptoms.  Denies sick contacts.  No other aggravating or relieving factors.  No other c/o.  She reports a PMH that is complex. States she has a history of atrial flutter and has had an ablation. Is on satolol and metoprolol and states she has been cleared to take all of her medications during pregnancy.  Dr. Karie Chimera is her cardiologist.  Also reports history of anxiety, depression, ADD and Bipolar. Takes Cymbalta and buspar. Was taking abilify but had to stop this for pregnancy.  Has a psychologist and is trying to find a psychiatrist that deals with pregnancy.  History of PCOS.  This is her first pregnancy.   History of hip surgery.   Other providers- OB/GYN, cardiologist, psychologist, ortho surgeon, chiropractor.   ROS as in subjective   Objective: Vitals:   05/27/16 1159  BP: 122/72  Pulse: 92  Resp: 16  Temp: 98.7 F (37.1 C)    General appearance: Alert, WD/WN, no distress                             Skin: warm, no rash                           Head: + maxillary sinus tenderness,                            Eyes: conjunctiva normal, corneas clear, PERRLA                            Ears: pearly TMs, external ear canals normal                          Nose: septum midline, turbinates swollen, with erythema and thick yellowish  discharge             Mouth/throat: MMM, tongue normal, mild pharyngeal erythema without exudate                           Neck: supple, no adenopathy, no thyromegaly, nontender                          Heart: RRR, normal S1, S2, no murmurs                         Lungs: CTA bilaterally, no wheezes, rales, or rhonchi       Assessment and Plan: Acute maxillary sinusitis, recurrence not specified - Plan: amoxicillin (AMOXIL) 875 MG tablet  Encounter to establish care  Prescription sent for amoxicillin, she denies allergies to this medication.  Can use  OTC Mucinex for congestion.  Tylenol OTC for fever and malaise.  Discussed symptomatic relief, nasal saline flush, and call or return if not back to baseline when finished with antibiotic.

## 2016-05-27 NOTE — Patient Instructions (Signed)
Take the amoxicillin as prescribed and let me know if you are not back to baseline when you finish.  You may want to use a neti pot and saline nasal spray.  Tylenol is safe during pregnancy for pain. But avoid all NSAIDS such as ibuprofen, advil, aleve etc.   Sinusitis, Adult Sinusitis is soreness and inflammation of your sinuses. Sinuses are hollow spaces in the bones around your face. Your sinuses are located:  Around your eyes.  In the middle of your forehead.  Behind your nose.  In your cheekbones. Your sinuses and nasal passages are lined with a stringy fluid (mucus). Mucus normally drains out of your sinuses. When your nasal tissues become inflamed or swollen, the mucus can become trapped or blocked so air cannot flow through your sinuses. This allows bacteria, viruses, and funguses to grow, which leads to infection. Sinusitis can develop quickly and last for 7?10 days (acute) or for more than 12 weeks (chronic). Sinusitis often develops after a cold. What are the causes? This condition is caused by anything that creates swelling in the sinuses or stops mucus from draining, including:  Allergies.  Asthma.  Bacterial or viral infection.  Abnormally shaped bones between the nasal passages.  Nasal growths that contain mucus (nasal polyps).  Narrow sinus openings.  Pollutants, such as chemicals or irritants in the air.  A foreign object stuck in the nose.  A fungal infection. This is rare. What increases the risk? The following factors may make you more likely to develop this condition:  Having allergies or asthma.  Having had a recent cold or respiratory tract infection.  Having structural deformities or blockages in your nose or sinuses.  Having a weak immune system.  Doing a lot of swimming or diving.  Overusing nasal sprays.  Smoking. What are the signs or symptoms? The main symptoms of this condition are pain and a feeling of pressure around the affected  sinuses. Other symptoms include:  Upper toothache.  Earache.  Headache.  Bad breath.  Decreased sense of smell and taste.  A cough that may get worse at night.  Fatigue.  Fever.  Thick drainage from your nose. The drainage is often green and it may contain pus (purulent).  Stuffy nose or congestion.  Postnasal drip. This is when extra mucus collects in the throat or back of the nose.  Swelling and warmth over the affected sinuses.  Sore throat.  Sensitivity to light. How is this diagnosed? This condition is diagnosed based on symptoms, a medical history, and a physical exam. To find out if your condition is acute or chronic, your health care provider may:  Look in your nose for signs of nasal polyps.  Tap over the affected sinus to check for signs of infection.  View the inside of your sinuses using an imaging device that has a light attached (endoscope). If your health care provider suspects that you have chronic sinusitis, you may also:  Be tested for allergies.  Have a sample of mucus taken from your nose (nasal culture) and checked for bacteria.  Have a mucus sample examined to see if your sinusitis is related to an allergy. If your sinusitis does not respond to treatment and it lasts longer than 8 weeks, you may have an MRI or CT scan to check your sinuses. These scans also help to determine how severe your infection is. In rare cases, a bone biopsy may be done to rule out more serious types of fungal sinus disease.  How is this treated? Treatment for sinusitis depends on the cause and whether your condition is chronic or acute. If a virus is causing your sinusitis, your symptoms will go away on their own within 10 days. You may be given medicines to relieve your symptoms, including:  Topical nasal decongestants. They shrink swollen nasal passages and let mucus drain from your sinuses.  Antihistamines. These drugs block inflammation that is triggered by  allergies. This can help to ease swelling in your nose and sinuses.  Topical nasal corticosteroids. These are nasal sprays that ease inflammation and swelling in your nose and sinuses.  Nasal saline washes. These rinses can help to get rid of thick mucus in your nose. If your condition is caused by bacteria, you will be given an antibiotic medicine. If your condition is caused by a fungus, you will be given an antifungal medicine. Surgery may be needed to correct underlying conditions, such as narrow nasal passages. Surgery may also be needed to remove polyps. Follow these instructions at home: Medicines   Take, use, or apply over-the-counter and prescription medicines only as told by your health care provider. These may include nasal sprays.  If you were prescribed an antibiotic medicine, take it as told by your health care provider. Do not stop taking the antibiotic even if you start to feel better. Hydrate and Humidify   Drink enough water to keep your urine clear or pale yellow. Staying hydrated will help to thin your mucus.  Use a cool mist humidifier to keep the humidity level in your home above 50%.  Inhale steam for 10-15 minutes, 3-4 times a day or as told by your health care provider. You can do this in the bathroom while a hot shower is running.  Limit your exposure to cool or dry air. Rest   Rest as much as possible.  Sleep with your head raised (elevated).  Make sure to get enough sleep each night. General instructions   Apply a warm, moist washcloth to your face 3-4 times a day or as told by your health care provider. This will help with discomfort.  Wash your hands often with soap and water to reduce your exposure to viruses and other germs. If soap and water are not available, use hand sanitizer.  Do not smoke. Avoid being around people who are smoking (secondhand smoke).  Keep all follow-up visits as told by your health care provider. This is important. Contact  a health care provider if:  You have a fever.  Your symptoms get worse.  Your symptoms do not improve within 10 days. Get help right away if:  You have a severe headache.  You have persistent vomiting.  You have pain or swelling around your face or eyes.  You have vision problems.  You develop confusion.  Your neck is stiff.  You have trouble breathing. This information is not intended to replace advice given to you by your health care provider. Make sure you discuss any questions you have with your health care provider. Document Released: 01/17/2005 Document Revised: 09/13/2015 Document Reviewed: 11/12/2014 Elsevier Interactive Patient Education  2017 [employer].

## 2016-05-30 ENCOUNTER — Telehealth: Payer: Self-pay

## 2016-05-30 NOTE — Telephone Encounter (Signed)
Records placed in your folder for review from [facility]. Trixie Rude

## 2016-06-02 ENCOUNTER — Encounter: Payer: Self-pay | Admitting: Family Medicine

## 2016-06-12 ENCOUNTER — Emergency Department (HOSPITAL_COMMUNITY)
Admission: EM | Admit: 2016-06-12 | Discharge: 2016-06-12 | Disposition: A | Discharge status: Home / Self Care | Payer: 59 | Attending: Emergency Medicine | Admitting: Emergency Medicine

## 2016-06-12 ENCOUNTER — Encounter (HOSPITAL_COMMUNITY): Payer: Self-pay

## 2016-06-12 DIAGNOSIS — Z79899 Other long term (current) drug therapy: Secondary | ICD-10-CM | POA: Insufficient documentation

## 2016-06-12 DIAGNOSIS — A084 Viral intestinal infection, unspecified: Secondary | ICD-10-CM | POA: Insufficient documentation

## 2016-06-12 DIAGNOSIS — R1010 Upper abdominal pain, unspecified: Secondary | ICD-10-CM | POA: Diagnosis present

## 2016-06-12 LAB — CBC
HCT: 39.8 % (ref 36.0–46.0)
HEMOGLOBIN: 13.6 g/dL (ref 12.0–15.0)
MCH: 31.7 pg (ref 26.0–34.0)
MCHC: 34.2 g/dL (ref 30.0–36.0)
MCV: 92.8 fL (ref 78.0–100.0)
Platelets: 247 10*3/uL (ref 150–400)
RBC: 4.29 MIL/uL (ref 3.87–5.11)
RDW: 13.3 % (ref 11.5–15.5)
WBC: 9.6 10*3/uL (ref 4.0–10.5)

## 2016-06-12 LAB — COMPREHENSIVE METABOLIC PANEL
ALBUMIN: 3.1 g/dL — AB (ref 3.5–5.0)
ALK PHOS: 145 U/L — AB (ref 38–126)
ALT: 31 U/L (ref 14–54)
ANION GAP: 12 (ref 5–15)
AST: 22 U/L (ref 15–41)
CALCIUM: 8.9 mg/dL (ref 8.9–10.3)
CO2: 18 mmol/L — AB (ref 22–32)
CREATININE: 0.41 mg/dL — AB (ref 0.44–1.00)
Chloride: 107 mmol/L (ref 101–111)
GFR calc Af Amer: >60 mL/min (ref >60)
GFR calc non Af Amer: >60 mL/min (ref >60)
GLUCOSE: 127 mg/dL — AB (ref 65–99)
Potassium: 3.5 mmol/L (ref 3.5–5.1)
SODIUM: 137 mmol/L (ref 135–145)
Total Bilirubin: 0.6 mg/dL (ref 0.3–1.2)
Total Protein: 6.3 g/dL — ABNORMAL LOW (ref 6.5–8.1)

## 2016-06-12 LAB — LIPASE, BLOOD: Lipase: 19 U/L (ref 11–51)

## 2016-06-12 MED ORDER — METOPROLOL TARTRATE 5 MG/5ML IV SOLN: 5.0000 mg | Freq: Once | INTRAVENOUS | Status: DC

## 2016-06-12 MED ORDER — METOPROLOL TARTRATE 25 MG PO TABS
25.0000 mg | ORAL_TABLET | Freq: Once | ORAL | Status: AC
  Administered 2016-06-12: 25 mg via ORAL
  Filled 2016-06-12: qty 1

## 2016-06-12 MED ORDER — ONDANSETRON 4 MG PO TBDP: 4.0000 mg | ORAL_TABLET | Freq: Three times a day (TID) | ORAL | 0 refills | Status: DC | PRN

## 2016-06-12 MED ORDER — METOCLOPRAMIDE HCL 5 MG/ML IJ SOLN
10.0000 mg | Freq: Once | INTRAMUSCULAR | Status: AC
  Administered 2016-06-12: 10 mg via INTRAVENOUS
  Filled 2016-06-12: qty 2

## 2016-06-12 MED ORDER — ONDANSETRON 4 MG PO TBDP
4.0000 mg | ORAL_TABLET | Freq: Once | ORAL | Status: AC
  Administered 2016-06-12: 4 mg via ORAL
  Filled 2016-06-12: qty 1

## 2016-06-12 MED ORDER — SODIUM CHLORIDE 0.9 % IV BOLUS (SEPSIS)
1000.0000 mL | Freq: Once | INTRAVENOUS | Status: AC
  Administered 2016-06-12: 1000 mL via INTRAVENOUS

## 2016-06-12 NOTE — Discharge Instructions (Signed)
Please read and follow all provided instructions.  Your diagnoses today include:  1. Viral gastroenteritis     Tests performed today include: Blood counts and electrolytes Blood tests to check liver and kidney function Blood tests to check pancreas function Urine test to look for infection and pregnancy (in women) Vital signs. See below for your results today.   Medications prescribed:   Take any prescribed medications only as directed.  Home care instructions:  Follow any educational materials contained in this packet.  Follow-up instructions: Please follow-up with your primary care provider in the next 2 days for further evaluation of your symptoms.    Return instructions:  SEEK IMMEDIATE MEDICAL ATTENTION IF: The pain does not go away or becomes severe  A temperature above 101F develops  Repeated vomiting occurs (multiple episodes)  The pain becomes localized to portions of the abdomen. The right side could possibly be appendicitis. In an adult, the left lower portion of the abdomen could be colitis or diverticulitis.  Blood is being passed in stools or vomit (bright red or black tarry stools)  You develop chest pain, difficulty breathing, dizziness or fainting, or become confused, poorly responsive, or inconsolable (young children) If you have any other emergent concerns regarding your health  Additional Information: Abdominal (belly) pain can be caused by many things. Your caregiver performed an examination and possibly ordered blood/urine tests and imaging (CT scan, x-rays, ultrasound). Many cases can be observed and treated at home after initial evaluation in the emergency department. Even though you are being discharged home, abdominal pain can be unpredictable. Therefore, you need a repeated exam if your pain does not resolve, returns, or worsens. Most patients with abdominal pain don't have to be admitted to the hospital or have surgery, but serious problems like  appendicitis and gallbladder attacks can start out as nonspecific pain. Many abdominal conditions cannot be diagnosed in one visit, so follow-up evaluations are very important.  Your vital signs today were: BP 138/78    Pulse (!) 137    Temp 97.8 F (36.6 C) (Oral)    Resp (!) 23    SpO2 100%  If your blood pressure (bp) was elevated above 135/85 this visit, please have this repeated by your doctor within one month. --------------

## 2016-06-12 NOTE — ED Notes (Signed)
OB Rapid nurse present at bedside

## 2016-06-12 NOTE — Progress Notes (Signed)
Spoke with Dr. Debroah LoopArnold. Pt is a G1P0 at 727 4/[redacted] weeks gestation here with c/o N&V, diarrhea since last night. [facility]. No vaginal bleeding or leaking of fluid. She is being [facility] for [facility]. She takes Lopressor 25mg  po x2 daily, sotalol 80mg  in the morning and 40mg  at night. She also has bipolar disorder and takes meds for that. She has a hx of an ablation 3 years ago for A-flutter and for SVT. Her HR is running in the 120's now,  Cardiac monitor showing sinus tach. EKG showed sinus tach. FHR is 160-170 BPM baseline, moderate variability, accels, and occasional mild variables. No uc's. She has received 1 bag of NS and is getting ready to receive another. She has had 10mg  of IV reglan. Pt is OB cleared after she has been on the Fetal monitor for 1 hour.

## 2016-06-12 NOTE — ED Triage Notes (Signed)
Patient complains of nausea, vomiting, diarrhea that started last night. States she has issues with rapid HR but this am unable to keep lopressor down. States she can feel her heart racing, no CP. Alert and oriented. [redacted] weeks pregnant. G1, PO

## 2016-06-12 NOTE — Progress Notes (Signed)
Pt is a G1P0 at 4527 4/[redacted] weeks gestation here with c/o N&V, diarrhea since last night.   [facility]. Her OB there is Evaristo BuryBarbara Eisenberg. She denies problems with the pregnancy,but is being followed by a [facility], Sandy SalaamAli Akbary with [facility]. Pt says that she had an ablation 3 years ago for A- flutter. Also 3 years ago, she had an ablation for SVT. Pt says she takes Sotalol 80mg  po in the morning and 40mg  po in the evening. She takes Lopressor 25mg  po x2 daily, she also takes other meds for bipolar disorder. No vaginal bleeding or leaking of fluid.

## 2016-06-12 NOTE — ED Provider Notes (Signed)
[facility] DEPT Provider Note   CSN: [id number] Arrival date & time: 06/12/16  0907     History   Chief Complaint No chief complaint on file.   HPI Christine Schneider is a 26 y.o. female.  HPI  26 y.o. female G1P0 at 5328 weeks (EDD 09/07/2016). Her pregnancy is complicated by history of atrial flutter status post radiofrequency ablation and bipolar disorder. Is followed by [facility] Rudolpho [facility]kbary [facility]. Had ablation done August 2017 and previously was on flecainide. Flecainide stopped earlier this pregnancy and now on metoprolol and sotalol. presents to the Emergency Department today complaining of N/V/D since yesterday. This occurred 6 hours after ingesting chicken salad at [employer]. No sick contacts. Notes abdominal pain and rates 3/10 in upper abdomen. No fevers. No CP/SOB. Notes inability to take heart medications such as metoprolol and sotalol. No other symptoms noted.     Past Medical History:  Diagnosis Date  . Abrasion of hand 09/09/2013   bilateral  . Anxiety   . Complication of anesthesia    hair loss after anesthesia  . Depression   . Dysrhythmia    svt  treated  . GERD (gastroesophageal reflux disease)    OTC as needed  . H/O hiatal hernia   . High cholesterol    no current med.  . Hip joint pain 08/2013   right - states has a problem with labrum  . Multiple allergies    states is allergic to everything except ragweed and beef  . Narcotic-induced nausea and vomiting    states needs Phenergan if Percocet is prescribed  . Pollen allergy   . PONV (postoperative nausea and vomiting)   . Restless leg syndrome   . SVT (supraventricular tachycardia) (HCC)   . 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 ADD (attention deficit disorder) 05/27/2016  . Anxiety and depression 05/27/2016  . Bipolar depression (HCC) 05/27/2016  . History of atrial flutter 05/27/2016    Past Surgical History:    Procedure Laterality Date  . HIP SURGERY Right 07/07/2011  . ULNAR NERVE TRANSPOSITION Right 09/13/2013   Procedure: RIGHT ELBOW ULNAR NERVE RELEASE ANTERIOR TRANSPOSITION WITH REPAIR AND RECONSTRUCTION ;  Surgeon: Dominica SeverinWilliam Gramig, MD;  Location: [facility];  Service: Orthopedics;  Laterality: Right;    OB History    Gravida Para Term Preterm AB Living   1             SAB TAB Ectopic Multiple Live Births                   Home Medications    Prior to Admission medications   Medication Sig Start Date End Date Taking? Authorizing Provider  amoxicillin (AMOXIL) 875 MG tablet Take 1 tablet (875 mg total) by mouth 2 (two) times daily. 05/27/16   Henson, Vickie L, NP-C  busPIRone (BUSPAR) 7.5 MG tablet Take 7.5 mg by mouth 2 (two) times daily after a meal. 09/01/15   [provider]  DULoxetine (CYMBALTA) 60 MG capsule TAKE 1 CAPSULE BY MOUTH DAILY 06/30/15   [provider]  levocetirizine (XYZAL) 5 MG tablet Take 5 mg by mouth every evening.    [provider]  metoprolol succinate (TOPROL-XL) 25 MG 24 hr tablet Take 25 mg by mouth daily.    [provider]  Prenatal Vit-Fe Fumarate-FA (CLASSIC PRENATAL PO) Take by mouth.    [provider]  sotalol (BETAPACE) 80 MG tablet 80 mg. 1 tablet in am and .5 tablet in evening    [provider]    Family History No family history on file.  Social History Social History  Substance Use Topics  . Smoking status: Never Smoker  . Smokeless tobacco: Never Used  . Alcohol use No     Comment: pregnant      Allergies   Percocet [oxycodone-acetaminophen] and Hydromorphone   Review of Systems Review of Systems ROS reviewed and all are negative for acute change except as noted in the HPI.  Physical Exam Updated Vital Signs BP (!) 143/86 (BP Location: Right Arm)   Pulse (!) 140   Temp 97.8 F (36.6 C) (Oral)   Resp 18   SpO2 98%   Physical Exam  Constitutional: She is  oriented to person, place, and time. Vital signs are normal. She appears well-developed and well-nourished.  HENT:  Head: Normocephalic and atraumatic.  Right Ear: Hearing normal.  Left Ear: Hearing normal.  Eyes: Conjunctivae and EOM are normal. Pupils are equal, round, and reactive to light.  Neck: Normal range of motion.  Cardiovascular: Regular rhythm, normal heart sounds and intact distal pulses.  Tachycardia present.   Pulmonary/Chest: Effort normal.  Abdominal: Soft. Normal appearance and bowel sounds are normal. There is no tenderness. There is no rigidity, no rebound, no guarding, no CVA tenderness, no tenderness at McBurney's point and negative Murphy's sign.  Neurological: She is alert and oriented to person, place, and time.  Skin: Skin is warm and dry.  Psychiatric: She has a normal mood and affect. Her speech is normal and behavior is normal. Thought content normal.  Nursing note and vitals reviewed.  ED Treatments / Results  Labs (all labs ordered are listed, but only abnormal results are displayed) Labs Reviewed  COMPREHENSIVE METABOLIC PANEL - Abnormal; Notable for the following:       Result Value   CO2 18 (*)    Glucose, Bld 127 (*)    BUN <5 (*)    Creatinine, Ser 0.41 (*)    Total Protein 6.3 (*)    Albumin 3.1 (*)    Alkaline Phosphatase 145 (*)    All other components within normal limits  CBC  LIPASE, BLOOD  URINALYSIS, ROUTINE W REFLEX MICROSCOPIC    EKG  EKG Interpretation  Date/Time:  Sunday Jun 12 2016 10:00:07 EDT Ventricular Rate:  124 PR Interval:    QRS Duration: 81 QT Interval:  318 QTC Calculation: 457 R Axis:   50 Text Interpretation:  Sinus tachycardia Borderline T abnormalities, anterior leads no prior EKG  Confirmed by LIU MD, DANA ([id number]) on 06/12/2016 10:04:16 AM      Radiology No results found.  Procedures Procedures (including critical care time)  Medications Ordered in ED Medications  metoprolol tartrate (LOPRESSOR)  tablet 25 mg (not administered)  sodium chloride 0.9 % bolus 1,000 mL (0 mLs Intravenous Stopped 06/12/16 1030)  metoCLOPramide (REGLAN) injection 10 mg (10 mg Intravenous Given 06/12/16 0954)  sodium chloride 0.9 % bolus 1,000 mL (0 mLs Intravenous Stopped 06/12/16 1151)     Initial Impression / Assessment and Plan / ED Course  I have reviewed the triage vital signs and the nursing notes.  Pertinent labs & imaging results that were available during my care of the patient were reviewed by me and considered in my medical decision making (see chart for details).  Final Clinical Impressions(s) / ED Diagnoses  {I have reviewed  and evaluated the relevant laboratory values.   {I have reviewed the relevant previous healthcare records.  {I obtained HPI from historian. {Patient discussed with supervising physician.  ED Course:  Assessment: Pt is a 26 y.o. female G1P0 at 21 weeks (EDD 09/07/2016). Her pregnancy is complicated by history of atrial flutter status post radiofrequency ablation and bipolar disorder. Is followed by Dr. Rudolpho Sevin with [facility] cardiology. Had ablation done August 2017 and previously was on flecainide. Flecainide stopped earlier this pregnancy and now on metoprolol and sotalol. who presents with N/V/D x 24 hours s/p ingestion of chicken salad. On exam, pt in NAD. Nontoxic/nonseptic appearing. VS with tachycardia. Afebrile. Lungs CTA. Heart RRR. Abdomen nontender soft. neg Murphys. Labs reassuring. Given Reglan and Fluids in ED with improvement. HR was initially 140s, but appears to be from dehydration from emesis and diarrhea. Fluids have corrected and EKG shows Sinus Tachycardia. Given home dose Metoprolol with improvement. Rapid OB has seen and cleared patient. Plan is to DC home with Zofran ODT and follow up to GYN. At time of discharge, Patient is in no acute distress. Vital Signs are stable. Patient is able to ambulate. Patient able to tolerate PO.   Disposition/Plan:  DC  Home Additional Verbal discharge instructions given and discussed with patient.  Pt Instructed to f/u with GYN in the next week for evaluation and treatment of symptoms. Return precautions given Pt acknowledges and agrees with plan  Supervising Physician Lavera Guise, MD  Final diagnoses:  Viral gastroenteritis    New Prescriptions New Prescriptions   No medications on file     Audry Pili, Cordelia Poche 06/12/16 1415    Lavera Guise, MD 06/12/16 1736

## 2016-06-14 ENCOUNTER — Ambulatory Visit (INDEPENDENT_AMBULATORY_CARE_PROVIDER_SITE_OTHER): Payer: 59 | Admitting: Family Medicine

## 2016-06-14 ENCOUNTER — Encounter: Payer: Self-pay | Admitting: Family Medicine

## 2016-06-14 VITALS — BP 110/74 | HR 94 | Temp 98.2°F | Resp 16 | Wt 207.6 lb

## 2016-06-14 DIAGNOSIS — R110 Nausea: Secondary | ICD-10-CM | POA: Diagnosis not present

## 2016-06-14 DIAGNOSIS — R5383 Other fatigue: Secondary | ICD-10-CM

## 2016-06-14 DIAGNOSIS — A084 Viral intestinal infection, unspecified: Secondary | ICD-10-CM | POA: Diagnosis not present

## 2016-06-14 LAB — COMPREHENSIVE METABOLIC PANEL
ALBUMIN: 3.4 g/dL — AB (ref 3.6–5.1)
ALK PHOS: 131 U/L — AB (ref 33–115)
ALT: 26 U/L (ref 6–29)
AST: 21 U/L (ref 10–30)
BILIRUBIN TOTAL: 0.4 mg/dL (ref 0.2–1.2)
BUN: 3 mg/dL — ABNORMAL LOW (ref 7–25)
CALCIUM: 8.8 mg/dL (ref 8.6–10.2)
CHLORIDE: 105 mmol/L (ref 98–110)
CO2: 25 mmol/L (ref 20–31)
CREATININE: 0.4 mg/dL — AB (ref 0.50–1.10)
GLUCOSE: 82 mg/dL (ref 65–99)
Potassium: 4.1 mmol/L (ref 3.5–5.3)
Sodium: 138 mmol/L (ref 135–146)
Total Protein: 5.9 g/dL — ABNORMAL LOW (ref 6.1–8.1)

## 2016-06-14 LAB — CBC WITH DIFFERENTIAL/PLATELET
Basophils Absolute: 0 cells/uL (ref 0–200)
Basophils Relative: 0 %
EOS PCT: 2 %
Eosinophils Absolute: 132 cells/uL (ref 15–500)
HEMATOCRIT: 36 % (ref 35.0–45.0)
Hemoglobin: 12.2 g/dL (ref 11.7–15.5)
LYMPHS PCT: 17 %
Lymphs Abs: 1122 cells/uL (ref 850–3900)
MCH: 31.9 pg (ref 27.0–33.0)
MCHC: 33.9 g/dL (ref 32.0–36.0)
MCV: 94 fL (ref 80.0–100.0)
MPV: 9.1 fL (ref 7.5–12.5)
Monocytes Absolute: 726 cells/uL (ref 200–950)
Monocytes Relative: 11 %
NEUTROS PCT: 70 %
Neutro Abs: 4620 cells/uL (ref 1500–7800)
Platelets: 251 10*3/uL (ref 140–400)
RBC: 3.83 MIL/uL (ref 3.80–5.10)
RDW: 13.5 % (ref 11.0–15.0)
WBC: 6.6 10*3/uL (ref 4.0–10.5)

## 2016-06-14 LAB — POCT URINALYSIS DIPSTICK
Bilirubin, UA: NEGATIVE
Glucose, UA: NEGATIVE
Leukocytes, UA: NEGATIVE
NITRITE UA: NEGATIVE
PH UA: 8 (ref 5.0–8.0)
PROTEIN UA: NEGATIVE
RBC UA: NEGATIVE
SPEC GRAV UA: 1.015 (ref 1.010–1.025)
UROBILINOGEN UA: NEGATIVE U/dL — AB

## 2016-06-14 NOTE — Progress Notes (Signed)
   Subjective:    Patient ID: Christine Schneider, female    DOB: 04-30-1990, 26 y.o.   MRN: [id number]  HPI Chief Complaint  Patient presents with  . er follow up    er follow up for possible food posion , still nausea    She is here with complaints of upset stomach. States 4 days ago she had abdominal cramping and then vomiting and diarrhea for several hours until the following morning and then she went to the ED. She was evaluated and treated with IV fluids and IV zofran. Diagnosed with viral gastroenteritis vs food poisoning.    She is [redacted] weeks pregnant. States her baby and pregnancy feels fine.  States she was cleared by OB. States she has not vomited or had diarrhea for the past 3 days. She has been taking Zofran and Immodium as needed.  States she feels at least 50% better but still feels very tired and has intermittent nausea.  She has appt with OB/GYN next week.   Denies fever, chills, dizziness, chest pain, palpitations, shortness of breath, abdominal pain, LE edema. She is urinating and denies any urinating symptoms.    Review of Systems Pertinent positives and negatives in the history of present illness.     Objective:   Physical Exam  Constitutional: She is oriented to person, place, and time. She appears well-developed and well-nourished. She does not have a sickly appearance. No distress.  HENT:  Nose: Nose normal.  Mouth/Throat: Uvula is midline, oropharynx is clear and moist and mucous membranes are normal.  Eyes: Conjunctivae and lids are normal. Pupils are equal, round, and reactive to light.  Neck: Full passive range of motion without pain. Neck supple. No thyromegaly present.  Cardiovascular: Normal rate, regular rhythm, normal heart sounds and normal pulses.   Pulmonary/Chest: Effort normal and breath sounds normal.  Lymphadenopathy:    She has no cervical adenopathy.  Neurological: She is alert and oriented to person, place, and time. She has normal strength.    Skin: Skin is warm and dry. No rash noted. She is not diaphoretic. No pallor.  Psychiatric: She has a normal mood and affect. Her speech is normal and behavior is normal. Thought content normal.   BP 110/74   Pulse 94   Temp 98.2 F (36.8 C)   Resp 16   Wt 207 lb 9.6 oz (94.2 kg)   SpO2 98%   BMI 35.63 kg/m       Assessment & Plan:  Nausea - Plan: CBC with Differential/Platelet, Comprehensive metabolic panel, POCT urinalysis dipstick  Viral gastroenteritis - Plan: CBC with Differential/Platelet, Comprehensive metabolic panel, POCT urinalysis dipstick  Fatigue, unspecified type - Plan: CBC with Differential/Platelet, Comprehensive metabolic panel, POCT urinalysis dipstick  UA dipstick: spec grav 1.015, Ket +   Discussed that appears to have turned the corner and is able to keep down fluids and eat again. Does not appear infectious or in any danger. Hemodynamically stable. Counseled on continuing supportive care and if she gets worse she will go back to the ED for IV fluids and evaluation.  Work note given.  Will follow up with labs.  She will see her OB/GYN next week as scheduled.

## 2016-06-14 NOTE — Patient Instructions (Addendum)
If you start vomiting or having uncontrolled diarrhea again then you will need to go back to the emergency department. I do not expect this to happen. You appear to be improving.   Stay well hydrated. Continue eating a bland diet and gradually add foods in as tolerated.  We will call you with lab results.

## 2016-06-17 ENCOUNTER — Telehealth: Payer: Self-pay | Admitting: Internal Medicine

## 2016-06-17 NOTE — Telephone Encounter (Signed)
Pt called in and states that she has vomited a couple times, having hot flashes and sweating and having dizziness and abdominal cramping. Per [employer] Schneider notes from the other day, pt was not having hot flashes or dizziness. Pt is 28 weeks preg and I advised her to call her OB and see what they advise as the blood work we did didn't show an sign of infection. Pt is not better but not any worse.

## 2016-07-26 ENCOUNTER — Telehealth: Payer: Self-pay | Admitting: Family Medicine

## 2016-07-26 NOTE — Telephone Encounter (Signed)
Ok to refill for one month  

## 2016-07-26 NOTE — Telephone Encounter (Signed)
Pt  Called and is requesting a refill on her cymbalta pt has a psy appt in July she wants a month supply pt uses [facility] [phone] - [place], [place] - [address] AT [facility] [address] pt can reached at [phone]

## 2016-07-27 MED ORDER — DULOXETINE HCL 60 MG PO CPEP: 60.0000 mg | ORAL_CAPSULE | Freq: Every day | ORAL | 0 refills | Status: DC

## 2016-07-27 NOTE — Telephone Encounter (Signed)
done

## 2016-10-26 ENCOUNTER — Ambulatory Visit (INDEPENDENT_AMBULATORY_CARE_PROVIDER_SITE_OTHER): Payer: 59 | Admitting: Family Medicine

## 2016-10-26 ENCOUNTER — Encounter: Payer: Self-pay | Admitting: Family Medicine

## 2016-10-26 ENCOUNTER — Ambulatory Visit: Payer: 59 | Admitting: Family Medicine

## 2016-10-26 VITALS — BP 112/70 | HR 58 | Wt 183.6 lb

## 2016-10-26 DIAGNOSIS — M79645 Pain in left finger(s): Secondary | ICD-10-CM | POA: Diagnosis not present

## 2016-10-26 DIAGNOSIS — M659 Synovitis and tenosynovitis, unspecified: Secondary | ICD-10-CM | POA: Diagnosis not present

## 2016-10-26 DIAGNOSIS — G8929 Other chronic pain: Secondary | ICD-10-CM

## 2016-10-26 NOTE — Progress Notes (Signed)
   Subjective:    Patient ID: Christine Schneider, female    DOB: 1990/10/06, 26 y.o.   MRN: [id number]  HPI Chief Complaint  Patient presents with  . wrist pain    left wrist pain. been going on 4 months or so. tried wearing splint but didn't help. has radiating pain some too   Complains of a 4 month history of left thumb and wrist pain. Denies injury. States pain started while pregnant and has worsened over the past month since delivery her child. Pain is worse with movement of her thumb. Pain is non radiating. States her thumb feels "inflamed" but has not had any redness, warmth or edema.  No fever, chills, numbness, tingling or weakness.  States she had a similar issue in her other hand 2 years ago and she saw Dr. Amanda Pea. States it ended up being related to her ulnar nerve and she had surgery.   She is breastfeeding.   Reviewed allergies, medications, past medical, surgical,  and social history.    Review of Systems Pertinent positives and negatives in the history of present illness.     Objective:   Physical Exam BP 112/70   Pulse (!) 58   Wt 183 lb 9.6 oz (83.3 kg)   Breastfeeding? Yes   BMI 31.51 kg/m  Alert and oriented and in on acute distress. Tenderness over first MCP joint of left hand. No erythema, edema. ROM normal, pain with flexion. Normal sensation and motor function. Positive Finkelsteins test. Left elbow exam is normal.        Assessment & Plan:  Chronic pain of left thumb  Tenosynovitis of thumb  Plan to continue with conservative treatment and refer her back to Dr. Amanda Pea.

## 2016-10-26 NOTE — Patient Instructions (Addendum)
Call and schedule to see Dr. Arcelia Jew can take Tylenol, use moist heat and wear the brace you have at home if this is helping.   De Quervain Tenosynovitis Tendons attach muscles to bones. They also help with joint movements. When tendons become irritated or swollen, it is called tendinitis. The extensor pollicis brevis (EPB) tendon connects the EPB muscle to a bone that is near the base of the thumb. The EPB muscle helps to straighten and extend the thumb. De Quervain tenosynovitis is a condition in which the EPB tendon lining (sheath) becomes irritated, thickened, and swollen. This condition is sometimes called stenosing tenosynovitis. This condition causes pain on the thumb side of the back of the wrist. What are the causes? Causes of this condition include:  Activities that repeatedly cause your thumb and wrist to extend.  A sudden increase in activity or change in activity that affects your wrist.  What increases the risk? This condition is more likely to develop in:  Females.  People who have diabetes.  Women who have recently given birth.  People who are over 71 years of age.  People who do activities that involve repeated hand and wrist motions, such as tennis, racquetball, volleyball, gardening, and taking care of children.  People who do heavy labor.  People who have poor wrist strength and flexibility.  People who do not warm up properly before activities.  What are the signs or symptoms? Symptoms of this condition include:  Pain or tenderness over the thumb side of the back of the wrist when your thumb and wrist are not moving.  Pain that gets worse when you straighten your thumb or extend your thumb or wrist.  Pain when the injured area is touched.  Locking or catching of the thumb joint while you bend and straighten your thumb.  Decreased thumb motion due to pain.  Swelling over the affected area.  How is this diagnosed? This condition is diagnosed  with a medical history and physical exam. Your health care provider will ask for details about your injury and ask about your symptoms. How is this treated? Treatment may include the use of icing and medicines to reduce pain and swelling. You may also be advised to wear a splint or brace to limit your thumb and wrist motion. In less severe cases, treatment may also include working with a physical therapist to strengthen your wrist and calm the irritation around your EPB tendon sheath. In severe cases, surgery may be needed. Follow these instructions at home: If you have a splint or brace:  Wear it as told by your health care provider. Remove it only as told by your health care provider.  Loosen the splint or brace if your fingers become numb and tingle, or if they turn cold and blue.  Keep the splint or brace clean and dry. Managing pain, stiffness, and swelling  If directed, apply ice to the injured area. ? Put ice in a plastic bag. ? Place a towel between your skin and the bag. ? Leave the ice on for 20 minutes, 2-3 times per day.  Move your fingers often to avoid stiffness and to lessen swelling.  Raise (elevate) the injured area above the level of your heart while you are sitting or lying down. General instructions  Return to your normal activities as told by your health care provider. Ask your health care provider what activities are safe for you.  Take over-the-counter and prescription medicines only as told  by your health care provider.  Keep all follow-up visits as told by your health care provider. This is important.  Do not drive or operate heavy machinery while taking prescription pain medicine. Contact a health care provider if:  Your pain, tenderness, or swelling gets worse, even if you have had treatment.  You have numbness or tingling in your wrist, hand, or fingers on the injured side. This information is not intended to replace advice given to you by your health  care provider. Make sure you discuss any questions you have with your health care provider. Document Released: 01/17/2005 Document Revised: 06/25/2015 Document Reviewed: 03/25/2014 Elsevier Interactive Patient Education  [employer].

## 2016-12-20 ENCOUNTER — Ambulatory Visit (INDEPENDENT_AMBULATORY_CARE_PROVIDER_SITE_OTHER): Payer: Self-pay | Admitting: Family Medicine

## 2016-12-20 ENCOUNTER — Encounter: Payer: Self-pay | Admitting: Family Medicine

## 2016-12-20 VITALS — BP 116/68 | HR 100 | Temp 99.0°F | Wt 189.2 lb

## 2016-12-20 DIAGNOSIS — R059 Cough, unspecified: Secondary | ICD-10-CM

## 2016-12-20 DIAGNOSIS — R05 Cough: Secondary | ICD-10-CM

## 2016-12-20 DIAGNOSIS — R6889 Other general symptoms and signs: Secondary | ICD-10-CM

## 2016-12-20 LAB — POC INFLUENZA A&B (BINAX/QUICKVUE)
INFLUENZA B, POC: NEGATIVE
Influenza A, POC: NEGATIVE

## 2016-12-20 NOTE — Progress Notes (Signed)
Chief Complaint  Patient presents with  . cold    cold, coughing congestion, runny nose, bodyaches    Subjective:  Christine Schneider is a [age] y.o. female who presents for a 5 day history of frontal headache, nasal congestion, sinus pressure, ears feeling clogged, body aches, sore throat, coughing.   Denies chest pain, palpitations, shortness of breath, wheezing, abdominal pain, N/V/D.   She is breastfeeding and has her 553 month old son with her.  She did not get a flu shot.   Treatment to date: Tylenol, throat spray.  Denies sick contacts.  No other aggravating or relieving factors.  No other c/o.  ROS as in subjective.   Objective: Vitals:   12/20/16 1415  BP: 116/68  Pulse: 100  Temp: 99 F (37.2 C)  SpO2: 97%    General appearance: Alert, WD/WN, no distress, mildly ill appearing                             Skin: warm, no rash                           Head: no sinus tenderness                            Eyes: conjunctiva normal, corneas clear, PERRLA                            Ears: pearly TMs, external ear canals normal                          Nose: septum midline, turbinates swollen, with erythema and clear discharge             Mouth/throat: MMM, tongue normal, mild pharyngeal erythema                           Neck: supple, no adenopathy, no thyromegaly, nontender                          Heart: RRR, normal S1, S2, no murmurs                         Lungs: CTA bilaterally, no wheezes, rales, or rhonchi      Assessment: Flu-like symptoms - Plan: POC Influenza A&B(BINAX/QUICKVUE)  Cough    Plan: Discussed diagnosis and treatment of URI. Rapid flu swab is negative.  Suggested symptomatic OTC remedies. She is aware that any decongestant or antihistamine may have effects on milk production and no good data on safety with lactation.  Nasal saline spray for congestion.  Tylenol or OTC for fever and malaise.  Call/return in 2-3 days if symptoms aren't resolving.

## 2016-12-20 NOTE — Patient Instructions (Addendum)
Your flu test is negative. You appear to have a viral illness.   Stay well hydrated and rest. Over the counter medication for colds and cough may be used short term if necessary but none say they are 100% safe with breast feeding.   If your symptoms are not improving in 2-3 days or if you significantly worse, let me know.

## 2016-12-27 ENCOUNTER — Telehealth: Payer: Self-pay | Admitting: Family Medicine

## 2016-12-27 ENCOUNTER — Other Ambulatory Visit: Payer: Self-pay | Admitting: Family Medicine

## 2016-12-27 MED ORDER — AMOXICILLIN 875 MG PO TABS: 875.0000 mg | ORAL_TABLET | Freq: Two times a day (BID) | ORAL | 0 refills | Status: DC

## 2016-12-27 NOTE — Telephone Encounter (Signed)
Sounds like she has a sinus infection and we can treat her with Amoxicillin. Please confirm no allergy to this. Amoxicillin 875 mg bid x 10 days. Have her follow up if not back to baseline after finishing the antibiotic.

## 2016-12-27 NOTE — Telephone Encounter (Signed)
[facility] at [address].  Pt is not allergic to Amox.  Also informed her of her infant may have diarrhea from the medication she said she had pro-biotics to give the baby.  Sent in meds to [facility].

## 2016-12-27 NOTE — Telephone Encounter (Signed)
Pt called and states she continues to have issues since her visit last week. She states she is having bad sinus pressure. She has had a very bad headache for 3 days. She also has jaw pain, ear pressure and her teeth hurt. She has taken tylenol and aleve and its not helping. She also wanted me to remind you she is breastfeeding. Pt uses [facility] at [address] and can be reached at [phone].

## 2017-03-08 ENCOUNTER — Ambulatory Visit (INDEPENDENT_AMBULATORY_CARE_PROVIDER_SITE_OTHER): Payer: Self-pay | Admitting: Medical

## 2017-03-08 ENCOUNTER — Encounter: Payer: Self-pay | Admitting: Medical

## 2017-03-08 VITALS — BP 120/80 | HR 91 | Wt 199.6 lb

## 2017-03-08 DIAGNOSIS — R232 Flushing: Secondary | ICD-10-CM

## 2017-03-08 DIAGNOSIS — R631 Polydipsia: Secondary | ICD-10-CM

## 2017-03-08 DIAGNOSIS — R5383 Other fatigue: Secondary | ICD-10-CM

## 2017-03-08 DIAGNOSIS — R635 Abnormal weight gain: Secondary | ICD-10-CM

## 2017-03-08 DIAGNOSIS — Z975 Presence of (intrauterine) contraceptive device: Secondary | ICD-10-CM

## 2017-03-08 LAB — POCT URINALYSIS DIP (PROADVANTAGE DEVICE)
Bilirubin, UA: NEGATIVE
Glucose, UA: NEGATIVE mg/dL
Ketones, POC UA: NEGATIVE mg/dL
Leukocytes, UA: NEGATIVE
NITRITE UA: NEGATIVE
PROTEIN UA: NEGATIVE mg/dL
RBC UA: NEGATIVE
SPECIFIC GRAVITY, URINE: 1.025
UUROB: NEGATIVE
pH, UA: 6 (ref 5.0–8.0)

## 2017-03-08 LAB — POCT URINE PREGNANCY: Preg Test, Ur: NEGATIVE

## 2017-03-08 LAB — POCT HEMOGLOBIN: Hemoglobin: 13.5 g/dL (ref 12.2–16.2)

## 2017-03-08 NOTE — Assessment & Plan Note (Signed)
If labs turn out to be normal, then Christine Schneider can call her gynecologist back to discuss her hot flashes and weight gain as it may relate to her IUD.

## 2017-03-08 NOTE — Assessment & Plan Note (Signed)
We discussed the possible differential which is wide.  She has multiple medications that can cause fatigue, known diagnosis of depression and bipolar, has a new baby, busy mom, but we will  evaluate for some specific causes labs today.  We did discuss the need to exercise which she has not been doing, discussed healthy diet, water intake.

## 2017-03-08 NOTE — Progress Notes (Signed)
Subjective: Chief Complaint  Patient presents with  . having night sweat, no engery    having night sweat, no engery , neck feeling swollen    Devona KonigSees Vickie NP here.    She has a history of SVT, anxiety and depression, bipolar disorder.  Had her son 6 months ago.  No issues post partum.  Had Mirena IUD inserted few weeks later.  She has a history of borderline anemia.  She had gestational diabetes when pregnant.   Has felt "off" for about a week.  Having hot flashes, night sweats, not sleeping well.   Been extremely tired, feels lethargic, heavy.  Hot flashes keeping her up.  No URI symptoms.  Feels neck lymph nodes are tender.  Has had some increased thirst.   She denies polyuria.  Denies any urinary symptoms.  She wonders if her symptoms are related to the IUD or thyroid or diabetes  Past Medical History:  Diagnosis Date  . Abrasion of hand 09/09/2013   bilateral  . Anxiety   . Complication of anesthesia    hair loss after anesthesia  . Depression   . Dysrhythmia    svt  treated  . GERD (gastroesophageal reflux disease)    OTC as needed  . H/O hiatal hernia   . High cholesterol    no current med.  . Hip joint pain 08/2013   right - states has a problem with labrum  . Multiple allergies    states is allergic to everything except ragweed and beef  . Narcotic-induced nausea and vomiting    states needs Phenergan if Percocet is prescribed  . Pollen allergy   . PONV (postoperative nausea and vomiting)   . Restless leg syndrome   . SVT (supraventricular tachycardia) (HCC)   . 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 DULoxetine (CYMBALTA) 30 MG capsule Take 30 mg by mouth daily.    . DULoxetine (CYMBALTA) 60 MG capsule Take 1 capsule (60 mg total) by mouth daily. 60 capsule 0  . levocetirizine (XYZAL) 5 MG tablet Take 5 mg by mouth every evening.    . metoprolol succinate (TOPROL-XL) 25 MG  24 hr tablet Take 25 mg by mouth daily.    . QUEtiapine (SEROQUEL) 50 MG tablet Take 50 mg by mouth.    . sotalol (BETAPACE) 80 MG tablet Take 40-80 mg by mouth See admin instructions. Take 80 mg by mouth in the morning and take 40 mg by mouth in the evening    . Prenatal Vit-Fe Fumarate-FA (CLASSIC PRENATAL PO) Take 1 tablet by mouth daily.      No current facility-administered medications on file prior to visit.    ROS as in subjective    Objective: BP 120/80   Pulse 91   Wt 199 lb 9.6 oz (90.5 kg)   SpO2 99%   BMI 34.26 kg/m   Wt Readings from Last 3 Encounters:  03/08/17 199 lb 9.6 oz (90.5 kg)  12/20/16 189 lb 3.2 oz (85.8 kg)  10/26/16 183 lb 9.6 oz (83.3 kg)   General appearance: alert, no distress, WD/WN HEENT: normocephalic, sclerae anicteric, conjunctiva pink and moist, TMs pearly, nares patent, no discharge or erythema, pharynx normal Oral cavity: MMM, no lesions Neck: supple, no lymphadenopathy, no thyromegaly, no masses Heart: RRR, normal S1, S2, no murmurs Lungs: CTA bilaterally, no wheezes, rhonchi, or rales Pulses: 2+ radial pulses, 2+ pedal pulses, normal  cap refill Ext: no edema Neuro: nonfocal exam   Assessment: Encounter Diagnoses  Name Primary?  . Fatigue, unspecified type Yes  . Hot flashes   . Weight gain   . Polydipsia   . IUD (intrauterine device) in place     Plan: Fatigue We discussed the possible differential which is wide.  She has multiple medications that can cause fatigue, known diagnosis of depression and bipolar, has a new baby, busy mom, but we will  evaluate for some specific causes labs today.  We did discuss the need to exercise which she has not been doing, discussed healthy diet, water intake.  Hot flashes If labs turn out to be normal, then she can call her gynecologist back to discuss her hot flashes and weight gain as it may relate to her IUD.   Verbena was seen today for having night sweat, no engery.  Diagnoses and  all orders for this visit:  Fatigue, unspecified type -     Hemoglobin -     POCT urine pregnancy -     TSH -     POCT Urinalysis DIP (Proadvantage Device)  Hot flashes Comments: IUD in place Orders: -     POCT urine pregnancy -     TSH  Weight gain -     POCT urine pregnancy -     TSH  Polydipsia  IUD (intrauterine device) in place   Follow up: pending labs

## 2017-03-09 ENCOUNTER — Ambulatory Visit: Payer: Self-pay | Admitting: Family Medicine

## 2017-03-09 LAB — TSH: TSH: 1.06 u[IU]/mL (ref 0.450–4.500)

## 2017-03-22 ENCOUNTER — Telehealth: Payer: Self-pay | Admitting: Family Medicine

## 2017-03-22 NOTE — Telephone Encounter (Signed)
Christine Schneider, I have spoken to you about this already, but I want to put it in writing so there is a chart note record  After looking back at the notes, I disagree with her perception of the visit.  I thought I was nice, polite, courteous, I listen to her concerns, and given her vague complaints of fatigue hot flashes and no energy, I explained that many things can contribute to this.  It is often not 1 clear-cut answer.  In particular she is on antidepressants, a mood stabilizer, and beta-blocker blood pressure pill, all of which can possibly contribute to fatigue.  She also has a relatively new baby, has the stress of raising a young child as well as working.  Her main concern when she was here was to rule out a thyroid issue or anemia or some objective finding.  This is what we did.  During that visit Christine Schneider informed me that her insurance was not in force.  So her comment about "what you want me to do"stems from my question to her about doing labs versus not doing labs since it may ultimately be out-of-pocket.  In other words I was giving her an option to do limited labs to save her some money or to do a full panel of labs or other evaluation as she was going to have to go back and talk to her insurance about whether she has coverage or not  since I am not her primary care provider, I advised that we will call with lab results, but depending on those results she can follow-up with Christine Schneider about next steps if things are normal.  Also advised she should follow-up with her gynecologist about the hot flashes and IUD concerns.   Thus  I believe I listen to her, answered her questions, did not feel that the visit was rash, and I believe her perception of how things went is not correct.  I apologize if she felt that way, but that certainly was not my intent  In medicine sometimes we do not have a clear-cut answer, but we can certainly narrow down the differential diagnosis and give advice which is what I did

## 2017-03-22 NOTE — Telephone Encounter (Signed)
Received an e-mail from Cindie LarocheShawvanugn Walker of patient relations advising patient was concerned about her appointment with Kristian CoveyShane Tysinger, she felt he showed no empathy and only told her to exercise and eat a balanced diet.  She feels she should not be responsible for the visit.  I have reviewed the chart and called the patient, no answer and voice mail is full.  I emailed the patient and requested that she call me.  The chart shows that several concerns were addressed and labs were drawn.  Vincenza HewsShane remembers an insurance coverage issues with her and he was concerned not to cause a lot of out of pocket expense for her since the system was showing no coverage.  Lab results came back and patient was advised.  Will try again to call patient later.

## 2017-04-11 ENCOUNTER — Telehealth: Payer: Self-pay | Admitting: Medical

## 2017-04-11 NOTE — Telephone Encounter (Signed)
Pt returned diana's call, advised she is out of the office for the afternoon but would have you return her call tomorrow.

## 2017-04-12 ENCOUNTER — Telehealth: Payer: Self-pay | Admitting: Family Medicine

## 2017-04-12 NOTE — Telephone Encounter (Signed)
Lafonda MossesDiana So noted. I provided appropriate care.  I am sorry she can't understand my decisions that day.  I do not feel comfortable ever seeing her again for medical care.   Vincenza HewsShane

## 2017-04-12 NOTE — Telephone Encounter (Signed)
Finally talked with patient today.  She felt like the appointment was a waste of time and that she wasn't listened to or heard.  She states about 2 days later her fever broke and she is feeling better.  She still doesn't understand why we did not take her temperature. I explained the CMA should have taken her temp.  She states she doesn't feel she should have to pay for this service as nothing came of it.  I explained that there were several items reviewed and a lot of different things were going on.  You were waiting on the labs to rule out and labs came back normal.  I also advised her you were trying to make sure not to over charge her with the labs and chose what you thought could really reveal what you needed to know since you were aware no insurance.  She stated when you first heard that she had no insurance you shut down and didn't want to care for her.  I explained we always concern ourselves with understanding the financial effects on our patients and do the best we can and still provide appropriate care.  She still was unhappy and wanted the charge removed.  I told her I doubted we would do that and would get back to her.  She was no insurance with a 55% discount.

## 2017-04-12 NOTE — Telephone Encounter (Signed)
Called patient. No answer, her mail box is full.

## 2017-04-13 ENCOUNTER — Encounter: Payer: Self-pay | Admitting: Family Medicine

## 2017-04-13 NOTE — Telephone Encounter (Signed)
Letter going out to patient regarding appointment charges stay the same and patient experience rep agreed.

## 2017-04-17 ENCOUNTER — Telehealth: Payer: Self-pay | Admitting: Family Medicine

## 2017-04-17 NOTE — Telephone Encounter (Signed)
Pt called and states that she had a panic attac

## 2017-04-18 ENCOUNTER — Encounter: Payer: Self-pay | Admitting: Family Medicine

## 2017-04-18 ENCOUNTER — Ambulatory Visit (INDEPENDENT_AMBULATORY_CARE_PROVIDER_SITE_OTHER): Payer: Self-pay | Admitting: Family Medicine

## 2017-04-18 VITALS — BP 120/80 | HR 72 | Temp 98.0°F | Wt 200.2 lb

## 2017-04-18 DIAGNOSIS — F410 Panic disorder [episodic paroxysmal anxiety] without agoraphobia: Secondary | ICD-10-CM

## 2017-04-18 DIAGNOSIS — F32A Depression, unspecified: Secondary | ICD-10-CM

## 2017-04-18 DIAGNOSIS — Z23 Encounter for immunization: Secondary | ICD-10-CM

## 2017-04-18 DIAGNOSIS — F329 Major depressive disorder, single episode, unspecified: Secondary | ICD-10-CM

## 2017-04-18 DIAGNOSIS — F419 Anxiety disorder, unspecified: Secondary | ICD-10-CM

## 2017-04-18 MED ORDER — ALPRAZOLAM 0.25 MG PO TABS: 0.2500 mg | ORAL_TABLET | Freq: Two times a day (BID) | ORAL | 0 refills | Status: DC | PRN

## 2017-04-18 NOTE — Progress Notes (Signed)
   Subjective:    Patient ID: Christine Schneider, female    DOB: [birth year], 27 y.o.   MRN: [id number]  HPI Chief Complaint  Patient presents with  . panic attacks    panic attacks, saw therapist and was asked to come here and get rx for xanax for when she has this.       She is here with complaints of having a panic attack over the weekend.  States she has not had a panic attack in many years.  She has seen her therapist since then and has an appointment later today as well. Denies any other panic attacks since then. Reports knowing what triggered this but does not care to expound on the topic.  States anxiety has worsened since starting back to work.  She is requesting a prescription for Xanax in case of future panic attacks.  She is breast-feeding and is aware of risks of medication interference.   Dr. Memory ArgueAtalia is her psychiatrist and she has an appointment in April.   Denies thoughts of suicide or homicide.   Denies fever, chills, dizziness, chest pain, shortness of breath, abdominal pain, N/V/D, urinary symptoms, LE edema.   Reviewed allergies, medications, past medical, surgical, family, and social history.    Review of Systems Pertinent positives and negatives in the history of present illness.     Objective:   Physical Exam BP 120/80   Pulse 72   Temp 98 F (36.7 C) (Oral)   Wt 200 lb 3.2 oz (90.8 kg)   Breastfeeding? Yes   BMI 34.36 kg/m   Alert and oriented and in no acute distress. Not otherwise examined.       Assessment & Plan:  Panic attacks - Plan: ALPRAZolam (XANAX) 0.25 MG tablet  Anxiety and depression - Plan: ALPRAZolam (XANAX) 0.25 MG tablet  Needs flu shot - Plan: Flu Vaccine QUAD 36+ mos IM   She does not appear to be in any danger or harmful to herself or others. She does have a psychiatrist and will be seeing her in April. She is seeing her therapists every 2 weeks. Has an appointment later today with her.  Will prescribe her a few low dose  Xanax and we did review potential risks involved with breast feeding. Discussed sedating nature of Xanax and to avoid alcohol or any other sedating medication as well as driving.  Requests flu shot. Flu shot given and counseling done.  Follow up in 2 weeks or sooner if needed.

## 2017-05-03 ENCOUNTER — Ambulatory Visit: Payer: Self-pay | Admitting: Family Medicine

## 2017-05-10 ENCOUNTER — Ambulatory Visit: Payer: Self-pay | Admitting: Family Medicine

## 2017-08-28 ENCOUNTER — Ambulatory Visit (INDEPENDENT_AMBULATORY_CARE_PROVIDER_SITE_OTHER): Payer: Self-pay | Admitting: Family Medicine

## 2017-08-28 ENCOUNTER — Encounter: Payer: Self-pay | Admitting: Family Medicine

## 2017-08-28 VITALS — BP 102/66 | HR 84 | Temp 99.3°F | Resp 18 | Ht 64.0 in | Wt 218.0 lb

## 2017-08-28 DIAGNOSIS — L659 Nonscarring hair loss, unspecified: Secondary | ICD-10-CM | POA: Insufficient documentation

## 2017-08-28 DIAGNOSIS — R21 Rash and other nonspecific skin eruption: Secondary | ICD-10-CM

## 2017-08-28 DIAGNOSIS — L988 Other specified disorders of the skin and subcutaneous tissue: Secondary | ICD-10-CM

## 2017-08-28 DIAGNOSIS — R635 Abnormal weight gain: Secondary | ICD-10-CM

## 2017-08-28 DIAGNOSIS — N926 Irregular menstruation, unspecified: Secondary | ICD-10-CM

## 2017-08-28 DIAGNOSIS — R232 Flushing: Secondary | ICD-10-CM

## 2017-08-28 DIAGNOSIS — Z8632 Personal history of gestational diabetes: Secondary | ICD-10-CM

## 2017-08-28 DIAGNOSIS — L639 Alopecia areata, unspecified: Secondary | ICD-10-CM | POA: Insufficient documentation

## 2017-08-28 LAB — POCT URINALYSIS DIP (PROADVANTAGE DEVICE)
Bilirubin, UA: NEGATIVE
Blood, UA: NEGATIVE
Glucose, UA: NEGATIVE mg/dL
Ketones, POC UA: NEGATIVE mg/dL
LEUKOCYTES UA: NEGATIVE
Nitrite, UA: NEGATIVE
PROTEIN UA: NEGATIVE mg/dL
Specific Gravity, Urine: 1.025
UUROB: 3.5
pH, UA: 6 (ref 5.0–8.0)

## 2017-08-28 LAB — POCT URINE PREGNANCY: PREG TEST UR: NEGATIVE

## 2017-08-28 MED ORDER — TRIAMCINOLONE ACETONIDE 0.1 % EX CREA: 1.0000 "application " | TOPICAL_CREAM | Freq: Two times a day (BID) | CUTANEOUS | 0 refills | Status: DC

## 2017-08-28 NOTE — Progress Notes (Signed)
Subjective:    Patient ID: Christine Schneider, female    DOB: 1990-09-12, 27 y.o.   MRN: [id number]  HPI Chief Complaint  Patient presents with  . Hairloss    weightgain, two distinct bald spots on back of her head. Various plaque type spots on her skin that she is concerned about. Derm told her to see PCP to discuss auto immune disorders.    She is here with complaints of weight gain and hair thinning. She has gained 18 lbs over the past 3 months and states she does not think she is eating enough to justify that type of weight gain. Does not know how many calories she is eating.  Reports bald spots on her scalp since early July and has seen a dermatologist. States she was diagnosed with alopecia areata and declined steroid injections to treat this. States the dermatologist was at [place]. They advised her to to see her PCP to rule out autoimmune issues.   She has a history of gestational diabetes and reports family history of diabetes type 1 and 2.   States she has developed several plaque-like spots that have appeared since seeing the dermatologist on her neck, left shoulder and arm. Reports this has only been for the past week.   Has an IUD. Reports periods are irregular.   Reports having hot flashes for the past 3-4 weeks.  States she recently saw her OB/GYN due to an issue with burning in her vagina after sex. States she was told she may have an allergy to her partner's semen. States she was prescribed a steroid cream for this. This has resolved.   Denies any morning joint stiffness. Reports soreness, cramping sensation and pain in her hands and feet throughout the day after being active. Denies any red, swollen joints.   She is followed by cardiology for history of atrial flutter. Reports pulse has been in normal range.   Denies fever, chills, dizziness, chest pain, palpitations, shortness of breath, abdominal pain, N/V/D, constipation, urinary symptoms, LE edema.   Works as a Merchandiser, retail.      Review of Systems Pertinent positives and negatives in the history of present illness.     Objective:   Physical Exam BP 102/66   Pulse 84   Temp 99.3 F (37.4 C) (Tympanic)   Resp 18   Ht 5\' 4"  (1.626 m)   Wt 218 lb (98.9 kg)   Breastfeeding? No   BMI 37.42 kg/m   Alert and in no distress.  Pharyngeal area is normal. Neck is supple without adenopathy or thyromegaly. Cardiac exam shows a regular rhythm without murmurs or gallops. Lungs are clear to auscultation. Extremities without edema, pulses intact. Skin is warm and dry. She does have 3 or 4 areas with dry, plaque-like lesions on her left cheek, left upper shoulder, and left arm. PERRLA, conjunctiva normal. CN intact, DTRs are mostly normal and symmetric except for a slightly decreased left patella reflex. Normal gait.       Assessment & Plan:  Abnormal weight gain - Plan: CBC with Differential/Platelet, Comprehensive metabolic panel, POCT Urinalysis DIP (Proadvantage Device), POCT urine pregnancy, TSH, T4, free, Hemoglobin A1c, ANA, FSH/LH  Hot flashes - Plan: CBC with Differential/Platelet, Comprehensive metabolic panel, POCT Urinalysis DIP (Proadvantage Device), ANA, FSH/LH, RPR, HIV antibody  Skin eruption - Plan: Sedimentation rate, ANA, RPR, HIV antibody, triamcinolone cream (KENALOG) 0.1 %  Hair thinning - Plan: TSH, T4, free, Sedimentation rate, ANA, FSH/LH  History of gestational  diabetes - Plan: POCT Urinalysis DIP (Proadvantage Device), Hemoglobin A1c  Alopecia areata  Abnormal menses - Plan: POCT Urinalysis DIP (Proadvantage Device), POCT urine pregnancy, TSH, T4, free, FSH/LH  Skin plaque  UPT negative. Urinalysis dipstick negative.  Discussed options for lab work and she would like to have "everything checked including tests for lupus". She does not currently have health insurance and I did discuss that this type of work up may be fairly expensive. She verbalized understanding and states she  wants to be tested.  No obvious systemic infection.  She declined treatment for alopecia areata recommended by her dermatologist. Apparently the skin eruptions were not present when she saw her dermatologist. Will try a topical steroid and if not improving, she will follow with dermatology.  IUD. Has an OB/GYN.  Hot flashes- will check labs. Consider relationship to hormone from IUD.  Gestational diabetes- check Hgb A1c.  Weight gain- plan to have her try using a free app such as My Fitness Pal to track calories. Check labs.  Follow up pending labs or in 4 weeks for weight gain.

## 2017-08-29 LAB — COMPREHENSIVE METABOLIC PANEL
A/G RATIO: 1.8 (ref 1.2–2.2)
ALK PHOS: 100 IU/L (ref 39–117)
ALT: 49 IU/L — AB (ref 0–32)
AST: 28 IU/L (ref 0–40)
Albumin: 4.8 g/dL (ref 3.5–5.5)
BUN/Creatinine Ratio: 14 (ref 9–23)
BUN: 11 mg/dL (ref 6–20)
Bilirubin Total: <0.2 mg/dL (ref 0.0–1.2)
CHLORIDE: 104 mmol/L (ref 96–106)
CO2: 24 mmol/L (ref 20–29)
Calcium: 9.7 mg/dL (ref 8.7–10.2)
Creatinine, Ser: 0.78 mg/dL (ref 0.57–1.00)
GFR calc non Af Amer: 105 mL/min/{1.73_m2} (ref >59)
GFR, EST AFRICAN AMERICAN: 120 mL/min/{1.73_m2} (ref >59)
GLOBULIN, TOTAL: 2.6 g/dL (ref 1.5–4.5)
Glucose: 105 mg/dL — ABNORMAL HIGH (ref 65–99)
POTASSIUM: 4.5 mmol/L (ref 3.5–5.2)
SODIUM: 142 mmol/L (ref 134–144)
Total Protein: 7.4 g/dL (ref 6.0–8.5)

## 2017-08-29 LAB — CBC WITH DIFFERENTIAL/PLATELET
BASOS: 1 %
Basophils Absolute: 0 10*3/uL (ref 0.0–0.2)
EOS (ABSOLUTE): 0.1 10*3/uL (ref 0.0–0.4)
EOS: 1 %
HEMATOCRIT: 44.2 % (ref 34.0–46.6)
Hemoglobin: 14.4 g/dL (ref 11.1–15.9)
Immature Grans (Abs): 0 10*3/uL (ref 0.0–0.1)
Immature Granulocytes: 0 %
LYMPHS ABS: 2.7 10*3/uL (ref 0.7–3.1)
Lymphs: 34 %
MCH: 30.6 pg (ref 26.6–33.0)
MCHC: 32.6 g/dL (ref 31.5–35.7)
MCV: 94 fL (ref 79–97)
MONOS ABS: 0 10*3/uL — AB (ref 0.1–0.9)
Monocytes: 0 %
NEUTROS PCT: 64 %
Neutrophils Absolute: 4.9 10*3/uL (ref 1.4–7.0)
PLATELETS: 335 10*3/uL (ref 150–450)
RBC: 4.71 x10E6/uL (ref 3.77–5.28)
RDW: 13.7 % (ref 12.3–15.4)
WBC: 7.8 10*3/uL (ref 3.4–10.8)

## 2017-08-29 LAB — ANA: Anti Nuclear Antibody(ANA): NEGATIVE

## 2017-08-29 LAB — T4, FREE: Free T4: 0.95 ng/dL (ref 0.82–1.77)

## 2017-08-29 LAB — HIV ANTIBODY (ROUTINE TESTING W REFLEX): HIV SCREEN 4TH GENERATION: NONREACTIVE

## 2017-08-29 LAB — FSH/LH
FSH: 3.2 m[IU]/mL
LH: 4 m[IU]/mL

## 2017-08-29 LAB — HEMOGLOBIN A1C
Est. average glucose Bld gHb Est-mCnc: 111 mg/dL
Hgb A1c MFr Bld: 5.5 % (ref 4.8–5.6)

## 2017-08-29 LAB — SEDIMENTATION RATE: Sed Rate: 20 mm/hr (ref 0–32)

## 2017-08-29 LAB — TSH: TSH: 2.66 u[IU]/mL (ref 0.450–4.500)

## 2017-08-29 LAB — RPR: RPR: NONREACTIVE

## 2017-08-30 ENCOUNTER — Other Ambulatory Visit: Payer: Self-pay | Admitting: Family Medicine

## 2017-08-30 DIAGNOSIS — R748 Abnormal levels of other serum enzymes: Secondary | ICD-10-CM

## 2017-10-09 ENCOUNTER — Other Ambulatory Visit: Payer: Self-pay

## 2017-10-09 ENCOUNTER — Telehealth: Payer: Self-pay | Admitting: Family Medicine

## 2017-10-09 DIAGNOSIS — R748 Abnormal levels of other serum enzymes: Secondary | ICD-10-CM

## 2017-10-09 NOTE — Telephone Encounter (Signed)
Pt come in and states that her psychiatrist is wanting her to go on 20 mg of prozac  States she needs you to prescribe it to her [id number] - [place], [place] - [address] AT [facility] OF [address] and pt can be reached at [phone]

## 2017-10-09 NOTE — Telephone Encounter (Signed)
Please call and ask her to see if her psychiatrist can send this in for her since her psychiatrist recommends it and has the ability to prescribe or find out why they are not prescribing it for her.

## 2017-10-10 LAB — HEPATIC FUNCTION PANEL
ALBUMIN: 4.7 g/dL (ref 3.5–5.5)
ALT: 27 IU/L (ref 0–32)
AST: 19 IU/L (ref 0–40)
Alkaline Phosphatase: 84 IU/L (ref 39–117)
BILIRUBIN TOTAL: 0.2 mg/dL (ref 0.0–1.2)
Bilirubin, Direct: 0.05 mg/dL (ref 0.00–0.40)
Total Protein: 6.8 g/dL (ref 6.0–8.5)

## 2017-10-10 MED ORDER — FLUOXETINE HCL 20 MG PO TABS: 20.0000 mg | ORAL_TABLET | Freq: Every day | ORAL | 0 refills | Status: DC

## 2017-10-10 NOTE — Addendum Note (Signed)
Addended by: Herminio Commons A on: 10/10/2017 12:55 PM   Modules accepted: Orders

## 2017-10-10 NOTE — Telephone Encounter (Signed)
I have filled this and pt has scheduled

## 2017-10-10 NOTE — Telephone Encounter (Signed)
Pt states that its her psychologist that recommended this and not her psychiatrist. Pt doesn't have an appt until 3 months out with psychiatrist.

## 2017-10-10 NOTE — Telephone Encounter (Signed)
Ok to send this in for her for 1 month and then have her come in 2 weeks after being on it. I will refill at that time for 2-3 months if she is doing well.

## 2017-10-10 NOTE — Telephone Encounter (Signed)
Tried to call pt but vm is full 

## 2017-11-06 ENCOUNTER — Encounter: Payer: Self-pay | Admitting: Family Medicine

## 2017-11-06 ENCOUNTER — Ambulatory Visit (INDEPENDENT_AMBULATORY_CARE_PROVIDER_SITE_OTHER): Payer: Self-pay | Admitting: Family Medicine

## 2017-11-06 VITALS — BP 130/80 | HR 100 | Wt 216.6 lb

## 2017-11-06 DIAGNOSIS — F419 Anxiety disorder, unspecified: Secondary | ICD-10-CM

## 2017-11-06 DIAGNOSIS — F900 Attention-deficit hyperactivity disorder, predominantly inattentive type: Secondary | ICD-10-CM

## 2017-11-06 DIAGNOSIS — F329 Major depressive disorder, single episode, unspecified: Secondary | ICD-10-CM

## 2017-11-06 DIAGNOSIS — F32A Depression, unspecified: Secondary | ICD-10-CM

## 2017-11-06 DIAGNOSIS — L639 Alopecia areata, unspecified: Secondary | ICD-10-CM

## 2017-11-06 MED ORDER — BETAMETHASONE DIPROPIONATE 0.05 % EX CREA: TOPICAL_CREAM | Freq: Two times a day (BID) | CUTANEOUS | 0 refills | Status: DC

## 2017-11-06 NOTE — Progress Notes (Signed)
   Subjective:    Patient ID: Christine Schneider, female    DOB: 17-Nov-1990, 27 y.o.   MRN: [id number]  HPI Chief Complaint  Patient presents with  . follow-up    follow-up on prozac. doing well on med.  would like vyvanse with prozac for ADHD   She is here to follow up on anxiety and depression. She would also like to discuss alopecia areata diagnosed by her dermatologist. She declined scalp injections and would like to try topical treatment.   She is taking Prozac and doing well on this. She is also seeing her counseling. States her counselor, Christain Sacramento in [facility], thinks she should start on ADHD medication.  She has noticed improvement in symptoms and states her co-workers and friends have also noticed.  Her patience has improved.   Psychiatrist in [place]. Dr. Zenaida Deed but cannot get an appointment for 3 months.  Denies SI or HI.   Denies fever, chills, dizziness, chest pain, palpitations, shortness of breath, abdominal pain, N/V/D, urinary symptoms, LE edema.   IUD in place.  She is not breastfeeding.   Reviewed allergies, medications, past medical, surgical, family, and social history.    Review of Systems Pertinent positives and negatives in the history of present illness.     Objective:   Physical Exam BP 130/80   Pulse 100   Wt 216 lb 9.6 oz (98.2 kg)   BMI 37.18 kg/m   Alert and oriented and in no acute distress. Scalp with 2 large patches of discrete, smooth, circular areas of hair loss.       Assessment & Plan:  Anxiety and depression  Attention deficit hyperactivity disorder (ADHD), predominantly inattentive type  Alopecia areata - Plan: betamethasone dipropionate (DIPROLENE) 0.05 % cream  She will continue on Prozac. Plan to have her call other psychiatrist offices to see if they can see her sooner. She has a complex mental health history and I will defer to psych to treat her ADHD and bipolar disorder.  Topical steroid prescribed to use on scalp. Follow  up in 4 weeks.

## 2017-11-06 NOTE — Patient Instructions (Signed)
You can call to schedule your appointment with the psychiatrist/counselor. A few offices are listed below for you to call.    [facility]  Ask for a psychiatrist  [address]  (across from [facility])  [phone]      [facility]  [address], [place], [place] [address]  Phone: [phone]   [facility] [address] [place], [place] [address]  Phone: [phone]

## 2017-11-19 ENCOUNTER — Other Ambulatory Visit: Payer: Self-pay | Admitting: Family Medicine

## 2017-11-20 NOTE — Telephone Encounter (Signed)
Is this okay to refill? 

## 2017-12-04 ENCOUNTER — Ambulatory Visit: Payer: Self-pay | Admitting: Family Medicine

## 2017-12-12 ENCOUNTER — Encounter: Payer: Self-pay | Admitting: Family Medicine

## 2017-12-18 ENCOUNTER — Other Ambulatory Visit: Payer: Self-pay | Admitting: Family Medicine

## 2017-12-18 NOTE — Telephone Encounter (Signed)
Is this okay to refill? 

## 2017-12-19 ENCOUNTER — Telehealth: Payer: Self-pay | Admitting: Family Medicine

## 2017-12-19 NOTE — Telephone Encounter (Signed)
Let's have her therapist write a letter with her concerns and send it to her psychiatrist and CC me on this please. Even though she does not have an appointment yet, they really should be in the drivers seat with her treatment.

## 2017-12-19 NOTE — Telephone Encounter (Signed)
Pt called and stated that her therapist has recommended that her fluoxetine be increased to 40 mg. She states she can not get in with a psychiatrist until after the new year. Pt can be reached at [phone] and uses [facility] at [address].

## 2017-12-19 NOTE — Telephone Encounter (Signed)
Pt was notified.  

## 2018-01-17 ENCOUNTER — Other Ambulatory Visit: Payer: Self-pay | Admitting: Family Medicine

## 2018-02-01 ENCOUNTER — Encounter: Payer: Self-pay | Admitting: Family Medicine

## 2018-02-01 ENCOUNTER — Ambulatory Visit: Payer: BLUE CROSS/BLUE SHIELD | Admitting: Family Medicine

## 2018-02-01 ENCOUNTER — Ambulatory Visit
Admission: RE | Admit: 2018-02-01 | Discharge: 2018-02-01 | Disposition: A | Discharge status: Home / Self Care | Payer: BLUE CROSS/BLUE SHIELD | Source: Ambulatory Visit | Attending: Family Medicine | Admitting: Family Medicine

## 2018-02-01 VITALS — BP 120/80 | HR 71 | Temp 98.2°F | Resp 16 | Wt 222.2 lb

## 2018-02-01 DIAGNOSIS — R0609 Other forms of dyspnea: Secondary | ICD-10-CM | POA: Diagnosis not present

## 2018-02-01 DIAGNOSIS — R0600 Dyspnea, unspecified: Secondary | ICD-10-CM

## 2018-02-01 DIAGNOSIS — I471 Supraventricular tachycardia: Secondary | ICD-10-CM | POA: Diagnosis not present

## 2018-02-01 DIAGNOSIS — J069 Acute upper respiratory infection, unspecified: Secondary | ICD-10-CM | POA: Diagnosis not present

## 2018-02-01 DIAGNOSIS — R071 Chest pain on breathing: Secondary | ICD-10-CM

## 2018-02-01 NOTE — Progress Notes (Signed)
Chief Complaint  Patient presents with  . sick    congestion, headache, chest tightness- with some SOB, some cough, x 3 days    Subjective:  Christine Schneider is a 28 y.o. female who presents for a 4 day history of rhinorrhea, nasal congestion, sore throat, dry cough. States cough is mild.  States she has chest pain that she describes as "tight" and "pressure and changes". This started 2 days ago in her upper back. She noted some radiation  to her right neck. Pain is worse with deep inspiration and certain movements.  No palpitations. She does report having dyspnea with exertion and nausea.  No orthopnea.   Denies fever, chills, dizziness, orthopnea, LE edema.   History of atrial flutter and had an ablation. Converted her to SVT. She is taking metoprolol and flecainide.   Dr. Rudolpho Sevin is her cardiologist.   Denies history of DVT or PE.  No family history of blood clots. No recent surgery, immobilization or long travel Denies leg or calf pain.  Treatment to date: Dayquil.  Denies sick contacts.  No other aggravating or relieving factors.    ROS as in subjective.   Objective: Vitals:   02/01/18 1124  BP: 120/80  Pulse: 71  Resp: 16  Temp: 98.2 F (36.8 C)  SpO2: 98%    General appearance: Alert, WD/WN, no distress, mildly ill appearing                             Skin: warm, no rash                           Head: no sinus tenderness                            Eyes: conjunctiva normal, corneas clear, PERRLA                            Ears: pearly TMs, external ear canals normal                          Nose: septum midline, turbinates swollen, with erythema and clear discharge             Mouth/throat: MMM, tongue normal, mild pharyngeal erythema                           Neck: supple, no adenopathy, no thyromegaly, nontender                          Heart: RRR, normal S1, S2, no murmurs. Chest wall TTP to sternal borders at 2 and 3rd intercostal spaces.         Lungs: CTA bilaterally, no wheezes, rales, or rhonchi Extremities without edema, pulses intact. Bilateral calves are soft, symmetrical and non tender. Negative Homan's sign.       Assessment: Chest pain made worse by breathing - Plan: CBC with Differential/Platelet, Comprehensive metabolic panel, DG Chest 2 View, EKG 12-Lead  Dyspnea on exertion - Plan: Comprehensive metabolic panel, DG Chest 2 View, EKG 12-Lead  Acute URI  SVT (supraventricular tachycardia) (HCC) - Plan: EKG 12-Lead    Plan: Discussed possible etiologies for chest pain including PE which is highly unlikely.  Ambulated patient and her oxygen saturation remained at 95% or higher.  ECG unremarkable.  Read by myself and Dr. Susann GivensLalonde.  This does not appear to be ACS.  Most likely her pain is related to musculoskeletal etiology.  Discussed taking anti-inflammatories. Chest x-ray and labs ordered.  Follow-up pending results.  Strict precautions for her to go to the ED or call 911 if symptoms worsen. She will also contact her cardiologist if symptoms are not improving.

## 2018-02-02 LAB — COMPREHENSIVE METABOLIC PANEL
ALK PHOS: 96 IU/L (ref 39–117)
ALT: 71 IU/L — ABNORMAL HIGH (ref 0–32)
AST: 29 IU/L (ref 0–40)
Albumin/Globulin Ratio: 2.1 (ref 1.2–2.2)
Albumin: 4.8 g/dL (ref 3.5–5.5)
BUN/Creatinine Ratio: 17 (ref 9–23)
BUN: 12 mg/dL (ref 6–20)
Bilirubin Total: 0.2 mg/dL (ref 0.0–1.2)
CO2: 22 mmol/L (ref 20–29)
Calcium: 9.6 mg/dL (ref 8.7–10.2)
Chloride: 101 mmol/L (ref 96–106)
Creatinine, Ser: 0.72 mg/dL (ref 0.57–1.00)
GFR calc Af Amer: 133 mL/min/{1.73_m2} (ref >59)
GFR calc non Af Amer: 115 mL/min/{1.73_m2} (ref >59)
Globulin, Total: 2.3 g/dL (ref 1.5–4.5)
Glucose: 87 mg/dL (ref 65–99)
Potassium: 4.4 mmol/L (ref 3.5–5.2)
Sodium: 140 mmol/L (ref 134–144)
Total Protein: 7.1 g/dL (ref 6.0–8.5)

## 2018-02-02 LAB — CBC WITH DIFFERENTIAL/PLATELET
Basophils Absolute: 0 10*3/uL (ref 0.0–0.2)
Basos: 1 %
EOS (ABSOLUTE): 0.1 10*3/uL (ref 0.0–0.4)
Eos: 2 %
Hematocrit: 42.2 % (ref 34.0–46.6)
Hemoglobin: 14.7 g/dL (ref 11.1–15.9)
Immature Grans (Abs): 0 10*3/uL (ref 0.0–0.1)
Immature Granulocytes: 0 %
Lymphocytes Absolute: 1.9 10*3/uL (ref 0.7–3.1)
Lymphs: 31 %
MCH: 31.7 pg (ref 26.6–33.0)
MCHC: 34.8 g/dL (ref 31.5–35.7)
MCV: 91 fL (ref 79–97)
Monocytes Absolute: 0.4 10*3/uL (ref 0.1–0.9)
Monocytes: 6 %
Neutrophils Absolute: 3.7 10*3/uL (ref 1.4–7.0)
Neutrophils: 60 %
Platelets: 327 10*3/uL (ref 150–450)
RBC: 4.64 x10E6/uL (ref 3.77–5.28)
RDW: 12.6 % (ref 12.3–15.4)
WBC: 6.1 10*3/uL (ref 3.4–10.8)

## 2018-02-03 LAB — SPECIMEN STATUS REPORT

## 2018-02-05 ENCOUNTER — Encounter: Payer: Self-pay | Admitting: Family Medicine

## 2018-02-05 DIAGNOSIS — R7401 Elevation of levels of liver transaminase levels: Secondary | ICD-10-CM

## 2018-02-05 DIAGNOSIS — R740 Nonspecific elevation of levels of transaminase and lactic acid dehydrogenase [LDH]: Secondary | ICD-10-CM

## 2018-02-05 HISTORY — DX: Elevation of levels of liver transaminase levels: R74.01

## 2018-02-05 LAB — HEPATITIS PANEL, ACUTE
HEP A IGM: NEGATIVE
HEP B C IGM: NEGATIVE
Hep C Virus Ab: <0.1 s/co ratio (ref 0.0–0.9)
Hepatitis B Surface Ag: NEGATIVE

## 2018-02-06 ENCOUNTER — Encounter: Payer: Self-pay | Admitting: Family Medicine

## 2018-02-07 ENCOUNTER — Other Ambulatory Visit: Payer: Self-pay | Admitting: Internal Medicine

## 2018-02-07 ENCOUNTER — Encounter: Payer: Self-pay | Admitting: Internal Medicine

## 2018-02-07 DIAGNOSIS — R748 Abnormal levels of other serum enzymes: Secondary | ICD-10-CM

## 2018-02-12 ENCOUNTER — Ambulatory Visit (INDEPENDENT_AMBULATORY_CARE_PROVIDER_SITE_OTHER): Payer: BLUE CROSS/BLUE SHIELD | Admitting: Family Medicine

## 2018-02-12 ENCOUNTER — Encounter: Payer: Self-pay | Admitting: Family Medicine

## 2018-02-12 VITALS — BP 116/78 | HR 95 | Temp 98.8°F | Ht 64.0 in | Wt 220.0 lb

## 2018-02-12 DIAGNOSIS — R350 Frequency of micturition: Secondary | ICD-10-CM | POA: Diagnosis not present

## 2018-02-12 LAB — POCT URINALYSIS DIP (PROADVANTAGE DEVICE)
Bilirubin, UA: NEGATIVE
Blood, UA: NEGATIVE
Glucose, UA: NEGATIVE mg/dL
Leukocytes, UA: NEGATIVE
Nitrite, UA: NEGATIVE
PROTEIN UA: NEGATIVE mg/dL
Specific Gravity, Urine: 1.02
pH, UA: 6 (ref 5.0–8.0)

## 2018-02-12 NOTE — Progress Notes (Signed)
Chief Complaint  Patient presents with  . Urinary Frequency    Subjective:  Christine Schneider is a 28 y.o. female who complains of possible urinary tract infection.  She has had symptoms for 3 days.  Symptoms include urinary frequency . Patient denies fever, chills, abdominal pain, back pain, N/V/D, vaginal discharge.  Last UTI was years ago.   Using nothing for current symptoms.     Patient does not have a history of recurrent UTI. Patient does not have a history of pyelonephritis.  No other aggravating or relieving factors.  No other c/o.  Past Medical History:  Diagnosis Date  . Abrasion of hand 09/09/2013   bilateral  . Anxiety   . Complication of anesthesia    hair loss after anesthesia  . Depression   . Dysrhythmia    svt  treated  . Elevated ALT measurement 02/05/2018  . GERD (gastroesophageal reflux disease)    OTC as needed  . H/O hiatal hernia   . High cholesterol    no current med.  . Hip joint pain 08/2013   right - states has a problem with labrum  . Multiple allergies    states is allergic to everything except ragweed and beef  . Narcotic-induced nausea and vomiting    states needs Phenergan if Percocet is prescribed  . Pollen allergy   . PONV (postoperative nausea and vomiting)   . Restless leg syndrome   . SVT (supraventricular tachycardia) (HCC)   . 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    Objective: Vitals:   02/12/18 1456  BP: 116/78  Pulse: 95  Temp: 98.8 F (37.1 C)  SpO2: 98%    General appearance: alert, no distress, WD/WN, female Abdomen: soft, non tender, non distended Back: no CVA tenderness GU: declines      Laboratory:  Urine dipstick: trace for ketones.       Assessment: Frequent urination - Plan: POCT Urinalysis DIP (Proadvantage Device), Urine Culture    Plan: Discussed that she does not appear to have a UTI based on  UA dipstick. Will send her urine for culture.    Advised increased water intake, can use OTC Tylenol for pain.    Advised to increase water intake.    Call or return if worse or not improving.

## 2018-02-13 ENCOUNTER — Ambulatory Visit
Admission: RE | Admit: 2018-02-13 | Discharge: 2018-02-13 | Disposition: A | Discharge status: Home / Self Care | Payer: BLUE CROSS/BLUE SHIELD | Source: Ambulatory Visit | Attending: Family Medicine | Admitting: Family Medicine

## 2018-02-13 DIAGNOSIS — R748 Abnormal levels of other serum enzymes: Secondary | ICD-10-CM

## 2018-02-14 ENCOUNTER — Other Ambulatory Visit: Payer: Self-pay | Admitting: Family Medicine

## 2018-02-14 ENCOUNTER — Encounter: Payer: Self-pay | Admitting: Family Medicine

## 2018-02-14 DIAGNOSIS — N3000 Acute cystitis without hematuria: Secondary | ICD-10-CM

## 2018-02-14 LAB — URINE CULTURE

## 2018-02-14 MED ORDER — SULFAMETHOXAZOLE-TRIMETHOPRIM 800-160 MG PO TABS: 1.0000 | ORAL_TABLET | Freq: Two times a day (BID) | ORAL | 0 refills | Status: DC

## 2018-02-26 ENCOUNTER — Encounter: Payer: Self-pay | Admitting: Family Medicine

## 2018-03-01 ENCOUNTER — Ambulatory Visit: Payer: BLUE CROSS/BLUE SHIELD | Admitting: Family Medicine

## 2018-03-01 ENCOUNTER — Encounter: Payer: Self-pay | Admitting: Family Medicine

## 2018-03-01 ENCOUNTER — Ambulatory Visit: Payer: BLUE CROSS/BLUE SHIELD

## 2018-03-01 VITALS — BP 120/72 | HR 86 | Wt 216.0 lb

## 2018-03-01 DIAGNOSIS — G479 Sleep disorder, unspecified: Secondary | ICD-10-CM | POA: Diagnosis not present

## 2018-03-01 DIAGNOSIS — F32A Depression, unspecified: Secondary | ICD-10-CM

## 2018-03-01 DIAGNOSIS — F410 Panic disorder [episodic paroxysmal anxiety] without agoraphobia: Secondary | ICD-10-CM | POA: Diagnosis not present

## 2018-03-01 DIAGNOSIS — F329 Major depressive disorder, single episode, unspecified: Secondary | ICD-10-CM | POA: Diagnosis not present

## 2018-03-01 DIAGNOSIS — F419 Anxiety disorder, unspecified: Secondary | ICD-10-CM

## 2018-03-01 MED ORDER — ALPRAZOLAM 0.25 MG PO TABS: 0.2500 mg | ORAL_TABLET | Freq: Two times a day (BID) | ORAL | 0 refills | Status: DC | PRN

## 2018-03-01 NOTE — Progress Notes (Signed)
   Subjective:    Patient ID: Christine Schneider, female    DOB: 1990-09-21, 28 y.o.   MRN: [id number]  HPI Chief Complaint  Patient presents with  . insomnia    go to sleep fine and then wake up and can't go back to sleep, been going over a week   She is here with complaints of a 2 week history of difficulty staying asleep.  States she only sleeps for a couple of hours and then is wide awake for the rest of the night.  States she just stays in bed for the rest of the night and watches TV or reads.  She was taking 2-hour daytime naps up until 3 days ago when she called for advice on this issue.  States this does not seem to be helping her sleep through the night. States she is sleeping in a dark cool room as I recommended, is not drinking caffeine after noon or eating before bed. States she has tried melatonin but is unsure of the dose and she is also tried ZzzQuil.  Neither of these have helped. History of bipolar disorder and states she is afraid she might go into a manic phase if she does not get some sleep.  She has seen her therapist for this issue.  She has not yet scheduled with a new psychiatrist as recommended.  States she will call and do this today.  Denies fever, chills, night sweats, palpitations, chest pain, shortness of breath, abdominal pain, nausea, vomiting, diarrhea, urinary symptoms.  She takes Xanax for panic attacks and states she only has 2 pills left which makes her very uncomfortable.  States she has not tried this for sleep.  Reviewed allergies, medications, past medical, surgical, family, and social history.   Review of Systems Pertinent positives and negatives in the history of present illness.     Objective:   Physical Exam BP 120/72   Pulse 86   Wt 216 lb (98 kg)   BMI 37.08 kg/m   Alert and oriented and in no acute distress.  Not otherwise examined.      Assessment & Plan:  Sleep disturbance  Anxiety and depression - Plan: ALPRAZolam (XANAX)  0.25 MG tablet  Panic attacks - Plan: ALPRAZolam (XANAX) 0.25 MG tablet  Counseling done on good sleep hygiene and she does appear to be doing the things I recommended 3 days ago.  Encouraged her that if she is in bed lying awake more than 30 minutes to get up and be active and productive to see if she will then get tired enough to go back to bed and be able to sleep. Refilled Xanax and she may try to use this temporarily until she gets an appointment with her psychiatrist but she is aware that I will not continue to refill this for her.

## 2018-03-08 ENCOUNTER — Other Ambulatory Visit: Payer: Self-pay | Admitting: Family Medicine

## 2018-03-08 NOTE — Telephone Encounter (Signed)
Is this okay to refill? 

## 2018-04-05 ENCOUNTER — Other Ambulatory Visit: Payer: Self-pay | Admitting: Family Medicine

## 2018-04-05 DIAGNOSIS — F419 Anxiety disorder, unspecified: Principal | ICD-10-CM

## 2018-04-05 DIAGNOSIS — F32A Depression, unspecified: Secondary | ICD-10-CM

## 2018-04-05 DIAGNOSIS — F410 Panic disorder [episodic paroxysmal anxiety] without agoraphobia: Secondary | ICD-10-CM

## 2018-04-05 DIAGNOSIS — F329 Major depressive disorder, single episode, unspecified: Secondary | ICD-10-CM

## 2018-04-05 LAB — HM PAP SMEAR

## 2018-04-05 NOTE — Telephone Encounter (Signed)
Please check and see if she has seen a psychiatrist. Ok to refill Prozac if she needs this but she is aware that I will not continue refilling Xanax. Please find out if she is out and how often she is taking it.

## 2018-04-05 NOTE — Telephone Encounter (Signed)
Is this okay to refill? 

## 2018-04-06 NOTE — Telephone Encounter (Signed)
Pt has an appt with psych next Friday march 13th. Please refill meds until the appt

## 2018-04-06 NOTE — Telephone Encounter (Signed)
Tried to call pt but VM is full 

## 2018-04-13 ENCOUNTER — Encounter: Payer: Self-pay | Admitting: Family Medicine

## 2018-04-15 DIAGNOSIS — M25551 Pain in right hip: Secondary | ICD-10-CM | POA: Insufficient documentation

## 2018-04-15 DIAGNOSIS — E282 Polycystic ovarian syndrome: Secondary | ICD-10-CM | POA: Insufficient documentation

## 2018-04-15 DIAGNOSIS — G2581 Restless legs syndrome: Secondary | ICD-10-CM | POA: Insufficient documentation

## 2018-04-15 DIAGNOSIS — I7300 Raynaud's syndrome without gangrene: Secondary | ICD-10-CM | POA: Insufficient documentation

## 2018-04-16 ENCOUNTER — Encounter: Payer: Self-pay | Admitting: Family Medicine

## 2018-05-03 ENCOUNTER — Other Ambulatory Visit: Payer: Self-pay | Admitting: Family Medicine

## 2018-05-03 NOTE — Telephone Encounter (Signed)
Pt was seeing psych for this med.

## 2018-05-13 ENCOUNTER — Encounter: Payer: Self-pay | Admitting: Family Medicine

## 2018-05-18 ENCOUNTER — Encounter: Payer: Self-pay | Admitting: Family Medicine

## 2018-05-18 ENCOUNTER — Other Ambulatory Visit: Payer: Self-pay

## 2018-05-18 ENCOUNTER — Ambulatory Visit (INDEPENDENT_AMBULATORY_CARE_PROVIDER_SITE_OTHER): Payer: BLUE CROSS/BLUE SHIELD | Admitting: Family Medicine

## 2018-05-18 VITALS — HR 94 | Temp 98.9°F | Wt 210.0 lb

## 2018-05-18 DIAGNOSIS — R202 Paresthesia of skin: Secondary | ICD-10-CM

## 2018-05-18 DIAGNOSIS — R200 Anesthesia of skin: Secondary | ICD-10-CM | POA: Diagnosis not present

## 2018-05-18 NOTE — Progress Notes (Signed)
   Subjective:    Patient ID: Christine Schneider, female    DOB: 1990/11/04, 28 y.o.   MRN: [id number]  Documentation for virtual audio and video telecommunications through Zoom encounter:  The patient was located at home. 2 patient identifiers used.  The provider was located in the office. The patient did consent to this visit and is aware of possible charges through their insurance for this visit.  The other persons participating in this telemedicine service were none. Her young son was present in the room.    HPI Chief Complaint  Patient presents with  . discuss Carpal Tunnel    carpal tunnel- 3 weeks. both wrist. hands are going to sleep., numbness and tingling.    Complains of a 3 week history of numbness and tingling of bilateral hands. Difficult to discern which fingers are involved. States this occurs several times during the day and at night.   No weakness. Denies redness, warmth, swelling of her elbows, wrists or hands.   Denies fever, chills, dizziness, neck pain,   Ulnar nerve surgery on her right arm in the past 5 years. Has seen Dr. Amanda Pea at [facility].     Review of Systems Pertinent positives and negatives in the history of present illness.     Objective:   Physical Exam Pulse 94   Temp 98.9 F (37.2 C)   Wt 210 lb (95.3 kg)   BMI 36.05 kg/m   Alert and oriented and in no acute distress.  Phalen's test results in numbness and tingling in fourth and fifth digits per patient.  Elevated hands test is positive.  Decreased sensation over thenar prominences per patient exam. No erythema, edema of bilateral elbows, wrists, hands and fingers.  Normal range of motion on video.      Assessment & Plan:  Numbness and tingling in both hands  Discussed this this sounds more related to ulnar compression but cannot rule out CTS.  Appears to be neurovascularly intact.  No weakness.  She will try Tylenol arthritis due to recently elevated liver function tests and  avoiding NSAIDs.  She will also try wrist splints to see if this helps as well as avoiding pressing on her elbows or bending her elbows more than 90 degrees.  She will follow-up in 1 to 2 weeks and let me know if symptoms are improving.  If not then we will refer her back to Dr. Amanda Pea for further evaluation and treatment since she has had ulnar surgery on right in the past.  This virtual service is not related to other E/M service within previous 7 days.

## 2018-05-24 ENCOUNTER — Encounter: Payer: Self-pay | Admitting: Family Medicine

## 2018-06-06 ENCOUNTER — Telehealth: Payer: Self-pay | Admitting: Family Medicine

## 2018-06-06 NOTE — Telephone Encounter (Signed)
Ok to give her a work note for tomorrow until we talk. Thanks.

## 2018-06-06 NOTE — Telephone Encounter (Signed)
Pt was notified that letter has been sent

## 2018-06-06 NOTE — Telephone Encounter (Signed)
Pt called to verify her cpe tomorrow.  She states she has been nauseous for 1 week, tired, loose stools x 1, severe headache, sob, allergies, joint pain, hands, arms, back, abd pain kidney area, no fever.  She has taken a pregnancy test which was negative.  Per Vickie changed tomorrow CPE to VIRTUAL accute visit.  Advised pt, she wants to know should she go to work tomorrow? Pt ph [phone]

## 2018-06-07 ENCOUNTER — Other Ambulatory Visit: Payer: Self-pay

## 2018-06-07 ENCOUNTER — Encounter: Payer: Self-pay | Admitting: Family Medicine

## 2018-06-07 ENCOUNTER — Ambulatory Visit (INDEPENDENT_AMBULATORY_CARE_PROVIDER_SITE_OTHER): Payer: BLUE CROSS/BLUE SHIELD | Admitting: Family Medicine

## 2018-06-07 VITALS — HR 103 | Resp 16 | Wt 206.0 lb

## 2018-06-07 DIAGNOSIS — R200 Anesthesia of skin: Secondary | ICD-10-CM

## 2018-06-07 DIAGNOSIS — Z8632 Personal history of gestational diabetes: Secondary | ICD-10-CM

## 2018-06-07 DIAGNOSIS — R1010 Upper abdominal pain, unspecified: Secondary | ICD-10-CM | POA: Diagnosis not present

## 2018-06-07 DIAGNOSIS — R5383 Other fatigue: Secondary | ICD-10-CM

## 2018-06-07 DIAGNOSIS — R110 Nausea: Secondary | ICD-10-CM | POA: Diagnosis not present

## 2018-06-07 DIAGNOSIS — R350 Frequency of micturition: Secondary | ICD-10-CM | POA: Diagnosis not present

## 2018-06-07 DIAGNOSIS — R109 Unspecified abdominal pain: Secondary | ICD-10-CM

## 2018-06-07 DIAGNOSIS — Z9889 Other specified postprocedural states: Secondary | ICD-10-CM

## 2018-06-07 NOTE — Progress Notes (Signed)
   Subjective:   Documentation for virtual audio and video telecommunications through Doximity encounter:  The patient was located at home. 2 patient identifiers.  The provider was located in the office. The patient did consent to this visit and is aware of possible charges through their insurance for this visit.  The other persons participating in this telemedicine service were none.    Patient ID: Christine Schneider, female    DOB: 15-Jul-1990, 28 y.o.   MRN: [id number]  HPI  Chief Complaint  Patient presents with  . sick    tired, nausea,tired, kidney area hurts feels brusied, abdominal pain, some SOB   She has multiple complaints including a one week history of nausea, upper abdominal pain, flank pain, decreased appetite, urinary frequency and increased thirst. Some questionable shortness of breath.   History of gestational diabetes.  Grandparents had diabetes.   LMP: 2 weeks ago  States she has an IUD and took a pregnancy test at home that was negative.   Denies fever, chills, dizziness,headache, URI symptoms, cough, chest pain, palpitations, vomiting, diarrhea, vaginal discharge, LE edema.   Reviewed allergies, medications, past medical, surgical, family, and social history.  Persistent numbness and tingling of bilateral hands and cannot discern which fingers are involved. She has had ulnar surgery in the past by Dr. Amanda Pea. Needs a new referral. Has failed conservative treatment.   No known Covid-19 exposure.   Review of Systems Pertinent positives and negatives in the history of present illness.     Objective:   Physical Exam Pulse (!) 103   Resp 16   Wt 206 lb (93.4 kg)   SpO2 96%   BMI 35.36 kg/m   Alert and oriented and in no acute distress. Respirations unlabored, speaking in complete sentences without difficulty. Speech is clear, normal mood and thought process.       Assessment & Plan:  Urinary frequency - Plan: Comprehensive metabolic panel, POCT  Urinalysis DIP (Proadvantage Device), POCT glucose (manual entry), POCT glycosylated hemoglobin (Hb A1C)  Flank pain - Plan: POCT Urinalysis DIP (Proadvantage Device)  Nausea  Pain of upper abdomen - Plan: CBC with Differential/Platelet, Comprehensive metabolic panel, Lipase  History of gestational diabetes - Plan: POCT glucose (manual entry), POCT glycosylated hemoglobin (Hb A1C)  Fatigue, unspecified type - Plan: CBC with Differential/Platelet, Comprehensive metabolic panel, TSH, T4, free  Bilateral hand numbness - Plan: Ambulatory referral to Hand Surgery  History of decompression of ulnar nerve - Plan: Ambulatory referral to Hand Surgery  No red flag symptoms but I am suspicious she may be hyperglycemic. She will come in for a urinalysis dipstick, PCOT glucose, Hgb A1c and other labs tomorrow morning.  Follow up pending labs. Upper abdominal pain may be related to reflux and we will need to consider other DDx.  Referral made to Dr. Amanda Pea per patient request.   Time spent on call was 24 minutes and in review of previous records 3 minutes total.  This virtual service is not related to other E/M service within previous 7 days.

## 2018-06-08 ENCOUNTER — Encounter: Payer: Self-pay | Admitting: Family Medicine

## 2018-06-08 ENCOUNTER — Other Ambulatory Visit (INDEPENDENT_AMBULATORY_CARE_PROVIDER_SITE_OTHER): Payer: BLUE CROSS/BLUE SHIELD

## 2018-06-08 DIAGNOSIS — Z8632 Personal history of gestational diabetes: Secondary | ICD-10-CM | POA: Diagnosis not present

## 2018-06-08 DIAGNOSIS — R350 Frequency of micturition: Secondary | ICD-10-CM | POA: Diagnosis not present

## 2018-06-08 LAB — POCT GLYCOSYLATED HEMOGLOBIN (HGB A1C): Hemoglobin A1C: 5.8 % — AB (ref 4.0–5.6)

## 2018-06-08 LAB — GLUCOSE, POCT (MANUAL RESULT ENTRY): POC Glucose: 88 mg/dl (ref 70–99)

## 2018-06-09 ENCOUNTER — Encounter: Payer: Self-pay | Admitting: Family Medicine

## 2018-06-14 ENCOUNTER — Telehealth: Payer: Self-pay | Admitting: Family Medicine

## 2018-06-14 NOTE — Telephone Encounter (Signed)
Pt is scheduled for tomorrow 

## 2018-06-14 NOTE — Telephone Encounter (Signed)
Ok to schedule her for tomorrow or next week.

## 2018-06-14 NOTE — Telephone Encounter (Signed)
Pt called and states covid test came back negative. She would like to have a in person visit. Please advise pt at [phone].

## 2018-06-15 ENCOUNTER — Encounter: Payer: Self-pay | Admitting: Family Medicine

## 2018-06-15 ENCOUNTER — Ambulatory Visit
Admission: RE | Admit: 2018-06-15 | Discharge: 2018-06-15 | Disposition: A | Discharge status: Home / Self Care | Payer: BLUE CROSS/BLUE SHIELD | Source: Ambulatory Visit | Attending: Family Medicine | Admitting: Family Medicine

## 2018-06-15 ENCOUNTER — Other Ambulatory Visit: Payer: Self-pay

## 2018-06-15 ENCOUNTER — Ambulatory Visit (INDEPENDENT_AMBULATORY_CARE_PROVIDER_SITE_OTHER): Payer: BLUE CROSS/BLUE SHIELD | Admitting: Family Medicine

## 2018-06-15 VITALS — BP 122/72 | HR 88 | Temp 98.6°F | Resp 16 | Wt 208.4 lb

## 2018-06-15 DIAGNOSIS — R300 Dysuria: Secondary | ICD-10-CM | POA: Diagnosis not present

## 2018-06-15 DIAGNOSIS — M545 Low back pain, unspecified: Secondary | ICD-10-CM

## 2018-06-15 DIAGNOSIS — R740 Nonspecific elevation of levels of transaminase and lactic acid dehydrogenase [LDH]: Secondary | ICD-10-CM | POA: Diagnosis not present

## 2018-06-15 DIAGNOSIS — G8929 Other chronic pain: Secondary | ICD-10-CM | POA: Insufficient documentation

## 2018-06-15 DIAGNOSIS — M25551 Pain in right hip: Secondary | ICD-10-CM

## 2018-06-15 DIAGNOSIS — R1030 Lower abdominal pain, unspecified: Secondary | ICD-10-CM

## 2018-06-15 DIAGNOSIS — R7401 Elevation of levels of liver transaminase levels: Secondary | ICD-10-CM

## 2018-06-15 LAB — POCT URINALYSIS DIP (PROADVANTAGE DEVICE)
Bilirubin, UA: NEGATIVE
Blood, UA: NEGATIVE
Glucose, UA: NEGATIVE mg/dL
Ketones, POC UA: NEGATIVE mg/dL
Leukocytes, UA: NEGATIVE
Nitrite, UA: NEGATIVE
Protein Ur, POC: NEGATIVE mg/dL
Specific Gravity, Urine: 1.015
Urobilinogen, Ur: NEGATIVE
pH, UA: 5.5 (ref 5.0–8.0)

## 2018-06-15 NOTE — Patient Instructions (Signed)
Go to [facility] to get your X ray.   We will forward your results.   Continue using a heating pad. You may take 2 Aleve twice daily with food OR 800 mg of Ibuprofen three times daily with food for the next week if needed.   Follow up with your orthopedist for further evaluation and treatment.

## 2018-06-15 NOTE — Progress Notes (Signed)
Subjective:    Patient ID: Christine Schneider, female    DOB: 03-14-1990, 28 y.o.   MRN: [id number]  HPI Chief Complaint  Patient presents with  . low back pain    low back pain, goes up lower spine, lower abdominal pain, shaky hands, pressure in chest and o2 drops down to 93   She is here today with multiple complaints. Complains of bilateral flank for the past 2 weeks. Pain is constant but worse with certain movements.  Pain is nonradiating. Pain is improved when sitting on a heating pad.  She has not tried any medication, states she does not like to take medicine. Denies history of kidney stones.  No known injury. Denies history of lumbar pain but she does report a history significant for right hip pain and states she was told by a orthopedic surgeon in [place] that she will need a specific type of surgery for this issue.  Denies numbness, tingling, weakness in her lower extremities.  No urinary retention.  No loss of control of bowels or bladder. Denies any unexplained weight loss.  States she has lost weight but this is intentional.  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  Fatigue x [redacted] weeks along with nausea.  She also reports she was having fever last week. [facility].  States she was having shortness of breath last week but this has improved.  States she checks her pulse ox at home and it drops when she is active.  Her oxygen saturation today is good.  She also complains of a nonspecific lower abdominal pain for the past 2 weeks. She does not feel that this is related to her back pain.   She was having urinary frequency but this has improved.  Hgb A1c and PCOT glucose showed that she does not have diabetes.   IUD.  States she also did a home pregnancy test that was negative.  Reviewed allergies, medications, past medical, surgical, family, and social history.    Review of Systems Pertinent positives  and negatives in the history of present illness.     Objective:   Physical Exam BP 122/72   Pulse 88   Temp 98.6 F (37 C) (Oral)   Resp 16   Wt 208 lb 6.4 oz (94.5 kg)   SpO2 99%   BMI 35.77 kg/m   Alert and oriented and in no distress.  Neck is supple without adenopathy or thyromegaly. Cardiac exam shows a regular sinus rhythm without murmurs or gallops. Lungs are clear to auscultation. Abdomen is soft, non distended, normal BS, mild lower abdominal without rebound or guarding. Back with full ROM, midline lumbar TTP, no paraspinal muscle tenderness. Extremities without edema, pulses intact. Bilateral LE are neurovascularly intact. Equal leg strength. Negative straight leg raise. Skin is warm and dry. Normal gait.       Assessment & Plan:  Acute midline low back pain without sciatica - Plan: Comprehensive metabolic panel, DG Lumbar Spine Complete  Dysuria - Plan: POCT Urinalysis DIP (Proadvantage Device)  Elevated ALT measurement - Plan: Comprehensive metabolic panel  Chronic pain of right hip  Lower abdominal pain  She has multiple complaints today.  She is not in any acute distress. No red flag symptoms.  Her main concern is bilateral flank pain x2 weeks however on exam she has midline lumbar tenderness.  Normal sensation and motion of her back. Plan to  send her for a lumbar x-ray.  Chronic right hip pain and has been advised in the past that she would need surgery to correct this.  Apparently this was in [place] and she has not been seen by an orthopedist locally for this issue. Discussed the possibility of physical therapy and she declines.  She prefers to see an orthopedist. Discussed trying an anti-inflammatory such as meloxicam or diclofenac and she declines.  Urinalysis dipstick is negative  History of elevated ALT.  We will repeat this today.  She does have a history of fatty liver disease and is aware of this.  Encouraged continued weight loss.  We did ambulate  to see if her oxygen level dropped and her sats remained between 95 and 98%  Follow-up pending CMP.

## 2018-06-16 ENCOUNTER — Encounter: Payer: Self-pay | Admitting: Family Medicine

## 2018-06-16 LAB — COMPREHENSIVE METABOLIC PANEL
ALT: 26 IU/L (ref 0–32)
AST: 21 IU/L (ref 0–40)
Albumin/Globulin Ratio: 2.1 (ref 1.2–2.2)
Albumin: 4.9 g/dL (ref 3.9–5.0)
Alkaline Phosphatase: 72 IU/L (ref 39–117)
BUN/Creatinine Ratio: 16 (ref 9–23)
BUN: 13 mg/dL (ref 6–20)
Bilirubin Total: 0.2 mg/dL (ref 0.0–1.2)
CO2: 22 mmol/L (ref 20–29)
Calcium: 9.7 mg/dL (ref 8.7–10.2)
Chloride: 101 mmol/L (ref 96–106)
Creatinine, Ser: 0.8 mg/dL (ref 0.57–1.00)
GFR calc Af Amer: 116 mL/min/{1.73_m2} (ref >59)
GFR calc non Af Amer: 101 mL/min/{1.73_m2} (ref >59)
Globulin, Total: 2.3 g/dL (ref 1.5–4.5)
Glucose: 90 mg/dL (ref 65–99)
Potassium: 4.3 mmol/L (ref 3.5–5.2)
Sodium: 140 mmol/L (ref 134–144)
Total Protein: 7.2 g/dL (ref 6.0–8.5)

## 2018-09-14 DIAGNOSIS — Z9889 Other specified postprocedural states: Secondary | ICD-10-CM | POA: Insufficient documentation

## 2018-11-29 NOTE — Patient Instructions (Addendum)
Please consult with Dr. Minna Merritts as to whether the episodes of oxygen desaturation may be heart vs pulmonary related. I am happy to refer you to pulmonology if he thinks this is needed.   Continue on your current medications. Limit NSAID use as tolerated.   Limit carbohydrates (potatoes, pasta, bread, rice) and increase your physical activity as you are able and allowed per your cardiologist and orthopedist.   We will contact you with your lab results.    Preventive Care 64-35 Years Old, Female Preventive care refers to visits with your health care provider and lifestyle choices that can promote health and wellness. This includes:  A yearly physical exam. This may also be called an annual well check.  Regular dental visits and eye exams.  Immunizations.  Screening for certain conditions.  Healthy lifestyle choices, such as eating a healthy diet, getting regular exercise, not using drugs or products that contain nicotine and tobacco, and limiting alcohol use. What can I expect for my preventive care visit? Physical exam Your health care provider will check your:  Height and weight. This may be used to calculate body mass index (BMI), which tells if you are at a healthy weight.  Heart rate and blood pressure.  Skin for abnormal spots. Counseling Your health care provider may ask you questions about your:  Alcohol, tobacco, and drug use.  Emotional well-being.  Home and relationship well-being.  Sexual activity.  Eating habits.  Work and work Statistician.  Method of birth control.  Menstrual cycle.  Pregnancy history. What immunizations do I need?  Influenza (flu) vaccine  This is recommended every year. Tetanus, diphtheria, and pertussis (Tdap) vaccine  You may need a Td booster every 10 years. Varicella (chickenpox) vaccine  You may need this if you have not been vaccinated. Human papillomavirus (HPV) vaccine  If recommended by your health care provider,  you may need three doses over 6 months. Measles, mumps, and rubella (MMR) vaccine  You may need at least one dose of MMR. You may also need a second dose. Meningococcal conjugate (MenACWY) vaccine  One dose is recommended if you are age 59-21 years and a first-year college student living in a residence hall, or if you have one of several medical conditions. You may also need additional booster doses. Pneumococcal conjugate (PCV13) vaccine  You may need this if you have certain conditions and were not previously vaccinated. Pneumococcal polysaccharide (PPSV23) vaccine  You may need one or two doses if you smoke cigarettes or if you have certain conditions. Hepatitis A vaccine  You may need this if you have certain conditions or if you travel or work in places where you may be exposed to hepatitis A. Hepatitis B vaccine  You may need this if you have certain conditions or if you travel or work in places where you may be exposed to hepatitis B. Haemophilus influenzae type b (Hib) vaccine  You may need this if you have certain conditions. You may receive vaccines as individual doses or as more than one vaccine together in one shot (combination vaccines). Talk with your health care provider about the risks and benefits of combination vaccines. What tests do I need?  Blood tests  Lipid and cholesterol levels. These may be checked every 5 years starting at age 9.  Hepatitis C test.  Hepatitis B test. Screening  Diabetes screening. This is done by checking your blood sugar (glucose) after you have not eaten for a while (fasting).  Sexually transmitted disease (STD)  testing.  BRCA-related cancer screening. This may be done if you have a family history of breast, ovarian, tubal, or peritoneal cancers.  Pelvic exam and Pap test. This may be done every 3 years starting at age 72. Starting at age 75, this may be done every 5 years if you have a Pap test in combination with an HPV  test. Talk with your health care provider about your test results, treatment options, and if necessary, the need for more tests. Follow these instructions at home: Eating and drinking   Eat a diet that includes fresh fruits and vegetables, whole grains, lean protein, and low-fat dairy.  Take vitamin and mineral supplements as recommended by your health care provider.  Do not drink alcohol if: ? Your health care provider tells you not to drink. ? You are pregnant, may be pregnant, or are planning to become pregnant.  If you drink alcohol: ? Limit how much you have to 0-1 drink a day. ? Be aware of how much alcohol is in your drink. In the [place] one drink equals one 12 oz bottle of beer (355 mL), one 5 oz glass of wine (148 mL), or one 1 oz glass of hard liquor (44 mL). Lifestyle  Take daily care of your teeth and gums.  Stay active. Exercise for at least 30 minutes on 5 or more days each week.  Do not use any products that contain nicotine or tobacco, such as cigarettes, e-cigarettes, and chewing tobacco. If you need help quitting, ask your health care provider.  If you are sexually active, practice safe sex. Use a condom or other form of birth control (contraception) in order to prevent pregnancy and STIs (sexually transmitted infections). If you plan to become pregnant, see your health care provider for a preconception visit. What's next?  Visit your health care provider once a year for a well check visit.  Ask your health care provider how often you should have your eyes and teeth checked.  Stay up to date on all vaccines. This information is not intended to replace advice given to you by your health care provider. Make sure you discuss any questions you have with your health care provider. Document Released: 03/15/2001 Document Revised: 09/28/2017 Document Reviewed: 09/28/2017 Elsevier Patient Education  2020 [employer].

## 2018-11-29 NOTE — Progress Notes (Signed)
Subjective:    Patient ID: Christine Schneider, female    DOB: 1990/06/29, 28 y.o.   MRN: [id number]  HPI Chief Complaint  Patient presents with  . cpe    cpe- sees obgyn   She is here for a complete physical exam and with concerns regarding to chronic health conditions. Last CPE: years ago   Other providers: OB/GYN- Dr. Senaida Ores at [facility]- Dr. Minna Merritts  Orthopedist- Dr. Collene Schlichter specialist- Dr. Amedeo Plenty for [facility] Psychiatrist - Dr. Daron Offer on [address]   Complains of chest pressure that does not appear to be cardiac related. She discussed this with Dr. Minna Merritts. States her oxygen saturation drops in the upper 70% range at times without episodes of tachycardia. States hypoxia lasts for 7-11 minutes. Occurs during the day with regular activity.  States her fingers tingle and she gets lightheaded.  Closely followed by Dr. Minna Merritts and states she has an upcoming study to look for a " shunt".  She questions whether I think she needs a referral to pulmonology.  Denies DOE, orthopnea, cough.  States she has had hearing issues for years.  Feels like she has hearing loss in both ears.  States she has not had her hearing checked.   History of elevated ALT.  Denies regular alcohol use.  She has been taking more NSAIDs recently due to hip pain and surgery.  Reports history of elevated lipids.  We will check these today.  History of prediabetes and gestational diabetes.  Has been eating more carbs lately.  Anxiety and depression-reports feeling stable on current medications.  She sees her psychiatrist.   Social history: Lives with husband and 13 year old, works as a Camera operator  Denies smoking, drinking alcohol, drug use  Diet: fairly healthy but increased carbohydrates. Intermittent fasting.  Excerise: none recently but has been going to PT  Immunizations: flu shot today.  States Tdap is up-to-date  Health maintenance:  Mammogram: N/A Colonoscopy: N/A Last  Gynecological Exam: January 2020  Last Menstrual cycle: last week Last Dental Exam: over a year  Last Eye Exam: this year   Wears seatbelt always, uses sunscreen, smoke detectors in home and functioning, does not text while driving and feels safe in home environment.   Reviewed allergies, medications, past medical, surgical, family, and social history.    Review of Systems Review of Systems Constitutional: -fever, -chills, -sweats, -unexpected weight change,-fatigue ENT: -runny nose, -ear pain, -sore throat Cardiology:  -chest pain, -palpitations, -edema Respiratory: -cough, -shortness of breath, -wheezing Gastroenterology: -abdominal pain, -nausea, -vomiting, -diarrhea, -constipation  Hematology: -bleeding or bruising problems Musculoskeletal: -arthralgias, -myalgias, -joint swelling, -back pain Ophthalmology: -vision changes Urology: -dysuria, -difficulty urinating, -hematuria, -urinary frequency, -urgency Neurology: -headache, -weakness, -tingling, -numbness       Objective:   Physical Exam BP 120/70   Pulse 91   Temp 98 F (36.7 C)   Resp 16   Ht 5\' 4"  (1.626 m)   Wt 206 lb (93.4 kg)   BMI 35.36 kg/m   General Appearance:    Alert, cooperative, no distress, appears stated age  Head:    Normocephalic, without obvious abnormality, atraumatic  Eyes:    PERRL, conjunctiva/corneas clear, EOM's intact, fundi    benign  Ears:    Normal TM's and external ear canals  Nose:  Mask in place  Throat:  Mask in place  Neck:   Supple, no lymphadenopathy;  thyroid:  no   enlargement/tenderness/nodules  Back:    Spine  nontender, no curvature, ROM normal, no CVA     tenderness  Lungs:     Clear to auscultation bilaterally without wheezes, rales or     ronchi; respirations unlabored  Chest Wall:    No tenderness or deformity   Heart:    Regular rate and rhythm, S1 and S2 normal, no murmur, rub   or gallop  Breast Exam:   OB/GYN  Abdomen:     Soft, non-tender, nondistended,  normoactive bowel sounds,    no masses, no hepatosplenomegaly  Genitalia:   OB/GYN  Rectal:    Not performed due to age<40 and no related complaints  Extremities:   No clubbing, cyanosis or edema  Pulses:   2+ and symmetric all extremities  Skin:   Skin color, texture, turgor normal, no rashes or lesions  Lymph nodes:   Cervical, supraclavicular, and axillary nodes normal  Neurologic:   CNII-XII intact, normal strength, sensation and gait; reflexes 2+ and symmetric throughout          Psych:   Normal mood, affect, hygiene and grooming.         Assessment & Plan:  Routine general medical examination at a health care facility - Plan: CBC with Differential/Platelet, Comprehensive metabolic panel, TSH, T4, free, Lipid panel -Here today for a fasting CPE.  Previous CPE years ago.  She does see her OB/GYN regularly.  Up-to-date on Pap smear.  Counseling on healthy lifestyle including diet and exercise.  Immunizations reviewed.  Discussed safety and health promotion.  Hypoxic episode -Suspect this may be more cardiac related and she will check with Dr. Scot Jun.  They have discussed this issue.  Oxygen level is normal today.  No acute pulmonary symptoms.  Elevated ALT measurement - Plan: Comprehensive metabolic panel -Most likely related to mild fatty liver disease as shown on ultrasound in January 2020.  Negative acute hepatitis panel . discussed cutting back on NSAIDs.  Will check hepatic function and follow-up.  Anxiety and depression -Stable on current medications.  Followed by psychiatry. IUD (intrauterine device) in place  History of hyperlipidemia - Plan: Lipid panel -Patient reports history of elevated lipids.  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 Encouraged using ear protection when possible around loud noises.   Prediabetes - Plan: Hemoglobin A1c -Discussed increased risk of developing diabetes and counseling on  healthy diet and exercise.  Needs flu shot - Plan: Flu Vaccine QUAD 36+ mos IM

## 2018-11-30 ENCOUNTER — Ambulatory Visit: Payer: BC Managed Care – PPO | Admitting: Family Medicine

## 2018-11-30 ENCOUNTER — Encounter: Payer: Self-pay | Admitting: Family Medicine

## 2018-11-30 ENCOUNTER — Other Ambulatory Visit: Payer: Self-pay

## 2018-11-30 VITALS — BP 120/70 | HR 91 | Temp 98.0°F | Resp 16 | Ht 64.0 in | Wt 206.0 lb

## 2018-11-30 DIAGNOSIS — Z23 Encounter for immunization: Secondary | ICD-10-CM

## 2018-11-30 DIAGNOSIS — Z8639 Personal history of other endocrine, nutritional and metabolic disease: Secondary | ICD-10-CM

## 2018-11-30 DIAGNOSIS — F419 Anxiety disorder, unspecified: Secondary | ICD-10-CM

## 2018-11-30 DIAGNOSIS — Z0000 Encounter for general adult medical examination without abnormal findings: Secondary | ICD-10-CM | POA: Diagnosis not present

## 2018-11-30 DIAGNOSIS — Z975 Presence of (intrauterine) contraceptive device: Secondary | ICD-10-CM | POA: Diagnosis not present

## 2018-11-30 DIAGNOSIS — R7401 Elevation of levels of liver transaminase levels: Secondary | ICD-10-CM

## 2018-11-30 DIAGNOSIS — F32A Depression, unspecified: Secondary | ICD-10-CM

## 2018-11-30 DIAGNOSIS — R7303 Prediabetes: Secondary | ICD-10-CM

## 2018-11-30 DIAGNOSIS — H9193 Unspecified hearing loss, bilateral: Secondary | ICD-10-CM | POA: Insufficient documentation

## 2018-11-30 DIAGNOSIS — R0902 Hypoxemia: Secondary | ICD-10-CM | POA: Diagnosis not present

## 2018-11-30 DIAGNOSIS — F329 Major depressive disorder, single episode, unspecified: Secondary | ICD-10-CM

## 2018-12-01 LAB — LIPID PANEL
Chol/HDL Ratio: 8 ratio — ABNORMAL HIGH (ref 0.0–4.4)
Cholesterol, Total: 231 mg/dL — ABNORMAL HIGH (ref 100–199)
HDL: 29 mg/dL — ABNORMAL LOW (ref >39)
LDL Chol Calc (NIH): 160 mg/dL — ABNORMAL HIGH (ref 0–99)
Triglycerides: 223 mg/dL — ABNORMAL HIGH (ref 0–149)
VLDL Cholesterol Cal: 42 mg/dL — ABNORMAL HIGH (ref 5–40)

## 2018-12-01 LAB — COMPREHENSIVE METABOLIC PANEL
ALT: 32 IU/L (ref 0–32)
AST: 18 IU/L (ref 0–40)
Albumin/Globulin Ratio: 2 (ref 1.2–2.2)
Albumin: 4.7 g/dL (ref 3.9–5.0)
Alkaline Phosphatase: 116 IU/L (ref 39–117)
BUN/Creatinine Ratio: 13 (ref 9–23)
BUN: 11 mg/dL (ref 6–20)
Bilirubin Total: <0.2 mg/dL (ref 0.0–1.2)
CO2: 24 mmol/L (ref 20–29)
Calcium: 9.7 mg/dL (ref 8.7–10.2)
Chloride: 102 mmol/L (ref 96–106)
Creatinine, Ser: 0.82 mg/dL (ref 0.57–1.00)
GFR calc Af Amer: 113 mL/min/{1.73_m2} (ref >59)
GFR calc non Af Amer: 98 mL/min/{1.73_m2} (ref >59)
Globulin, Total: 2.4 g/dL (ref 1.5–4.5)
Glucose: 95 mg/dL (ref 65–99)
Potassium: 4.8 mmol/L (ref 3.5–5.2)
Sodium: 142 mmol/L (ref 134–144)
Total Protein: 7.1 g/dL (ref 6.0–8.5)

## 2018-12-01 LAB — CBC WITH DIFFERENTIAL/PLATELET
Basophils Absolute: 0 10*3/uL (ref 0.0–0.2)
Basos: 1 %
EOS (ABSOLUTE): 0.2 10*3/uL (ref 0.0–0.4)
Eos: 3 %
Hematocrit: 40.6 % (ref 34.0–46.6)
Hemoglobin: 14 g/dL (ref 11.1–15.9)
Immature Grans (Abs): 0 10*3/uL (ref 0.0–0.1)
Immature Granulocytes: 0 %
Lymphocytes Absolute: 1.8 10*3/uL (ref 0.7–3.1)
Lymphs: 30 %
MCH: 30.9 pg (ref 26.6–33.0)
MCHC: 34.5 g/dL (ref 31.5–35.7)
MCV: 90 fL (ref 79–97)
Monocytes Absolute: 0.4 10*3/uL (ref 0.1–0.9)
Monocytes: 6 %
Neutrophils Absolute: 3.7 10*3/uL (ref 1.4–7.0)
Neutrophils: 60 %
Platelets: 354 10*3/uL (ref 150–450)
RBC: 4.53 x10E6/uL (ref 3.77–5.28)
RDW: 12.4 % (ref 11.7–15.4)
WBC: 6.1 10*3/uL (ref 3.4–10.8)

## 2018-12-01 LAB — HEMOGLOBIN A1C
Est. average glucose Bld gHb Est-mCnc: 111 mg/dL
Hgb A1c MFr Bld: 5.5 % (ref 4.8–5.6)

## 2018-12-01 LAB — TSH: TSH: 3.59 u[IU]/mL (ref 0.450–4.500)

## 2018-12-01 LAB — T4, FREE: Free T4: 1.33 ng/dL (ref 0.82–1.77)

## 2018-12-02 ENCOUNTER — Encounter: Payer: Self-pay | Admitting: Family Medicine

## 2018-12-02 DIAGNOSIS — E782 Mixed hyperlipidemia: Secondary | ICD-10-CM | POA: Insufficient documentation

## 2018-12-07 ENCOUNTER — Telehealth: Payer: Self-pay | Admitting: Family Medicine

## 2018-12-07 NOTE — Telephone Encounter (Signed)
Received requested records from [facility].

## 2018-12-17 ENCOUNTER — Other Ambulatory Visit: Payer: Self-pay | Admitting: Internal Medicine

## 2018-12-25 ENCOUNTER — Other Ambulatory Visit: Payer: Self-pay

## 2019-02-04 ENCOUNTER — Other Ambulatory Visit: Payer: Self-pay

## 2019-02-04 ENCOUNTER — Encounter: Payer: Self-pay | Admitting: Family Medicine

## 2019-02-04 ENCOUNTER — Ambulatory Visit: Payer: 59 | Admitting: Family Medicine

## 2019-02-04 VITALS — BP 110/72 | HR 82 | Temp 98.2°F | Wt 205.6 lb

## 2019-02-04 DIAGNOSIS — Z8249 Family history of ischemic heart disease and other diseases of the circulatory system: Secondary | ICD-10-CM

## 2019-02-04 DIAGNOSIS — T148XXA Other injury of unspecified body region, initial encounter: Secondary | ICD-10-CM

## 2019-02-04 DIAGNOSIS — Z8679 Personal history of other diseases of the circulatory system: Secondary | ICD-10-CM

## 2019-02-04 NOTE — Progress Notes (Signed)
   Subjective:    Patient ID: Christine Schneider, female    DOB: 1990-02-21, 29 y.o.   MRN: [id number]  HPI Chief Complaint  Patient presents with  . raynaud's disease    raynaud's disease- in feet, lump on left shin bone alittle bruising still    She is here with complaints of intermittent cold feet, numb or "dead" toes for the past several years. States she is sleeping in thick socks every night. States her problem is getting worse.  States she was diagnosed with Raynauds 8 years ago approximately and states she would like to see Dr. Sharmon Revere at [facility], rheumatology.  States her family members see this provider.  She denies being on medication for this in the past.   States she had a large bump/hematoma that appeared spontaneously on her left anterior lower leg. It was non tender and has basically resolved. No known injury. No other bruising and no bleeding.   Denies fever, chills, dizziness, chest pain, palpitations, shortness of breath, abdominal pain, N/V/D, urinary symptoms, LE edema.    Reviewed allergies, medications, past medical, surgical, family, and social history.   Review of Systems Pertinent positives and negatives in the history of present illness.     Objective:   Physical Exam BP 110/72   Pulse 82   Temp 98.2 F (36.8 C)   Wt 205 lb 9.6 oz (93.3 kg)   SpO2 98%   BMI 35.29 kg/m   Bilateral lower extremities without edema. Left anterior lower leg without obvious abnormality. Distal pulses intact. Equal foot warmth.       Assessment & Plan:  History of Raynaud's syndrome - Plan: Ambulatory referral to Rheumatology  Family history of Raynaud's syndrome - Plan: Ambulatory referral to Rheumatology  Bruise  Per patient request, I will refer her to Dr. Sharmon Revere at [facility] rheumatology due to history of Raynauds that she feels is worsening.  Bruise on left LE has healed and no known cause. She will be on the lookout for any other abnormal  bruising or bleeding and let me know.

## 2019-02-19 ENCOUNTER — Encounter: Payer: Self-pay | Admitting: Internal Medicine

## 2019-03-06 ENCOUNTER — Encounter: Payer: Self-pay | Admitting: Family Medicine

## 2019-03-06 DIAGNOSIS — R0902 Hypoxemia: Secondary | ICD-10-CM

## 2019-04-08 ENCOUNTER — Telehealth: Payer: Self-pay | Admitting: Internal Medicine

## 2019-04-08 NOTE — Telephone Encounter (Signed)
Left message for pt to call back. Vickie would like to see pt for eval for hip pain.

## 2019-04-15 ENCOUNTER — Ambulatory Visit (INDEPENDENT_AMBULATORY_CARE_PROVIDER_SITE_OTHER): Payer: 59 | Admitting: Critical Care Medicine

## 2019-04-15 ENCOUNTER — Encounter: Payer: Self-pay | Admitting: Critical Care Medicine

## 2019-04-15 ENCOUNTER — Other Ambulatory Visit: Payer: Self-pay

## 2019-04-15 VITALS — BP 120/86 | HR 74 | Temp 98.0°F | Ht 64.0 in | Wt 207.2 lb

## 2019-04-15 DIAGNOSIS — R0602 Shortness of breath: Secondary | ICD-10-CM

## 2019-04-15 LAB — D-DIMER, QUANTITATIVE: D-Dimer, Quant: 0.69 mcg/mL FEU — ABNORMAL HIGH (ref <0.50)

## 2019-04-15 MED ORDER — ALBUTEROL SULFATE HFA 108 (90 BASE) MCG/ACT IN AERS: 2.0000 | INHALATION_SPRAY | Freq: Four times a day (QID) | RESPIRATORY_TRACT | 11 refills | Status: AC | PRN

## 2019-04-15 MED ORDER — FLOVENT HFA 110 MCG/ACT IN AERO: 1.0000 | INHALATION_SPRAY | Freq: Two times a day (BID) | RESPIRATORY_TRACT | 12 refills | Status: DC

## 2019-04-15 NOTE — Progress Notes (Signed)
Synopsis: Referred in March 2021 for periodic hypoxia by Girtha Rm, NP-C.  Subjective:   PATIENT ID: Christine Schneider GENDER: female DOB: 12-06-1990, MRN: [id number]  Chief Complaint  Patient presents with  . Consult     Patient is here to establish care for asthma. Patient has shortness of breath and exertion makes it worse.     Christine Schneider is a 29 y/o woman with a history of SVT who presents for evaluation of periodic hypoxia and shortness of breath.  Her shortness of breath is sometimes at rest and sometimes dyspnea on exertion. This has been ongoing since hip surgery about 9 months ago.  She was possibly having shortness of breath periodically before that, but it has been more persistent in the past 9 months.  She has a pulse oximeter at home where she measures her saturations.  She has had episodes where her sats dropped as low as 78% and took about 11 minutes to recover, associated with shortness of breath, lightheadedness.  It resolved spontaneously.  At that time her heart rate was in the 50s, and remained in the 50s until about 10 minutes later when her saturations were about 89% with a heart rate of 110.  She followed up with her cardiologist who adjusted her medications.  Since then she has only been dropping to the 80s during these episodes.  She had a Zio patch for monitoring, and had no bradycardic episodes.  She had a few episodes of PVCs, but nothing to explain hypoxia.  She remains on flecainide, metoprolol, Cardizem.  She follows with cardiologist Dr. Minna Merritts at [facility]; most recent visit 03/15/2019 (note reviewed).  She reports having episodes where her saturations drop even while she is sitting still, which resolve on their own.  She tried her husband's albuterol 1 time when her sats were low and she felt it helped her saturations improve more quickly.  Her mother possibly has asthma and both her parents and sister have allergies.  There is no other known history of lung  disease.  She has no known history of lung disease.  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  She has seasonal allergies for which she takes Zyrtec.  She lives in a house with 8 cats and several dogs.  She works as a Camera operator.  There is mold in several areas of her house-in sheet rock and around the windows.  In her previous house when she was younger she had significant symptoms of shortness of breath, cough, throat tightening when there was significant mold in the house.  Her symptoms improved when she moved out.  After her hip surgery in July 2020 she has been less active.  She has no history of leg edema or blood clots.  She was on DVT prophylaxis for 1 month following surgery.  No known family history of VTE.  She has a history of Raynaud's disease for which she was evaluated at [facility] by rheumatologist Dr. Gerilyn Nestle on 03/01/19 (reviewed).  He was started on low-dose amlodipine and it was felt that there was low suspicion for connective tissue disease.     Past Medical History:  Diagnosis Date  . Abrasion of hand 09/09/2013   bilateral  . Anxiety   . Complication of anesthesia    hair loss after anesthesia  . Depression   . Dysrhythmia    svt  treated  . Elevated ALT measurement  02/05/2018  . GERD (gastroesophageal reflux disease)    OTC as needed  . H/O hiatal hernia   . High cholesterol    no current med.  . Hip joint pain 08/2013   right - states has a problem with labrum  . Mixed hyperlipidemia   . Multiple allergies    states is allergic to everything except ragweed and beef  . Narcotic-induced nausea and vomiting    states needs Phenergan if Percocet is prescribed  . PCOS (polycystic ovarian syndrome)   . Pollen allergy   . PONV (postoperative nausea and vomiting)   . Restless leg syndrome   . SVT (supraventricular tachycardia) ([place])   . 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 Hyperlipidemia Mother   . Hypertension Father   . Depression Sister      Past Surgical History:  Procedure Laterality Date  . HIP SURGERY Right 07/07/2011  . ULNAR NERVE TRANSPOSITION Right 09/13/2013   Procedure: RIGHT ELBOW ULNAR NERVE RELEASE ANTERIOR TRANSPOSITION WITH REPAIR AND RECONSTRUCTION ;  Surgeon: Roseanne Kaufman, MD;  Location: [place];  Service: Orthopedics;  Laterality: Right;    Social History   Socioeconomic History  . Marital status: Significant Other    Spouse name: Not on file  . Number of children: Not on file  . Years of education: Not on file  . Highest education level: Not on file  Occupational History  . Not on file  Tobacco Use  . Smoking status: Never Smoker  . Smokeless tobacco: Never Used  Substance and Sexual Activity  . Alcohol use: No  . Drug use: No  . Sexual activity: Yes    Birth control/protection: I.U.D.  Other Topics Concern  . Not on file  Social History Narrative  . Not on file   Social Determinants of Health   Financial Resource Strain:   . Difficulty of Paying Living Expenses:   Food Insecurity:   . Worried About Charity fundraiser in the Last Year:   . Arboriculturist in the Last Year:   Transportation Needs:   . Film/video editor (Medical):   Marland Kitchen Lack of Transportation (Non-Medical):   Physical Activity:   . Days of Exercise per Week:   . Minutes of Exercise per Session:   Stress:   . Feeling of Stress :   Social Connections:   . Frequency of Communication with Friends and Family:   . Frequency of Social Gatherings with Friends and Family:   . Attends Religious Services:   . Active Member of Clubs or Organizations:   . Attends Archivist Meetings:   Marland Kitchen Marital Status:   Intimate Partner Violence:   . Fear of Current or Ex-Partner:   . Emotionally Abused:   Marland Kitchen Physically Abused:   . Sexually Abused:      Allergies  Allergen Reactions  . Percocet  [Oxycodone-Acetaminophen] Nausea And Vomiting  . Hydromorphone Itching and Other (See Comments)    Dilaudid     Immunization History  Administered Date(s) Administered  . Influenza,inj,Quad PF,6+ Mos 04/18/2017, 11/30/2018  . Influenza,inj,quad, With Preservative 02/03/2016  . Influenza-Unspecified 03/23/2018  . MMR 09/03/2016, 09/03/2016  . Tdap 11/08/2014, 11/08/2014, 06/21/2016, 06/21/2016    Outpatient Medications Prior to Visit  Medication Sig Dispense Refill  . ALPRAZolam (XANAX) 0.25 MG tablet TAKE 1 TABLET(0.25 MG) BY MOUTH TWICE DAILY AS NEEDED FOR ANXIETY 12 tablet 0  .  Ascorbic Acid (VITAMIN C ER PO) Take by mouth.    . DULoxetine (CYMBALTA) 60 MG capsule Take 1 capsule (60 mg total) by mouth daily. (Patient taking differently: Take 120 mg by mouth daily. ) 60 capsule 0  . flecainide (TAMBOCOR) 50 MG tablet Take 1 tablet by mouth 2 (two) times daily.    Marland Kitchen GLUCOS-CHON-MSM-CA-C-CTCL-SECU PO Take by mouth.    . lamoTRIgine (LAMICTAL) 200 MG tablet Take 200 mg by mouth daily.    Marland Kitchen levocetirizine (XYZAL) 5 MG tablet Take 5 mg by mouth every evening.    . metoprolol succinate (TOPROL-XL) 25 MG 24 hr tablet Take 25 mg by mouth daily.    . Multiple Vitamins-Minerals (MULTIVITAMIN WITH MINERALS) tablet Take 1 tablet by mouth daily.    . Omega-3 Fatty Acids (FISH OIL PO) Take by mouth.    . RESVERATROL PO Take by mouth.    . TRAZODONE HCL PO Take 50 mg by mouth at bedtime.     No facility-administered medications prior to visit.    Review of Systems  Constitutional: Negative.   HENT: Positive for congestion.   Respiratory: Positive for shortness of breath. Negative for sputum production and wheezing.   Cardiovascular: Negative for chest pain and leg swelling.  Gastrointestinal: Negative for heartburn.  Endo/Heme/Allergies: Positive for environmental allergies.     Objective:   Vitals:   04/15/19 1359  BP: 120/86  Pulse: 74  Temp: 98 F (36.7 C)  TempSrc: Temporal    SpO2: 96%  Weight: 207 lb 3.2 oz (94 kg)  Height: _0  (1.626 m)   96% on  RA BMI Readings from Last 3 Encounters:  04/15/19 35.57 kg/m  02/04/19 35.29 kg/m  11/30/18 35.36 kg/m   Wt Readings from Last 3 Encounters:  04/15/19 207 lb 3.2 oz (94 kg)  02/04/19 205 lb 9.6 oz (93.3 kg)  11/30/18 206 lb (93.4 kg)    Physical Exam Vitals reviewed.  Constitutional:      Appearance: Normal appearance. She is not ill-appearing.  HENT:     Head: Normocephalic and atraumatic.  Eyes:     General: No scleral icterus. Cardiovascular:     Rate and Rhythm: Normal rate and regular rhythm.     Heart sounds: No murmur.  Pulmonary:     Comments: Breathing comfortably on RA, no conversational dyspnea.  Clear to auscultation bilaterally. Abdominal:     General: There is no distension.     Palpations: Abdomen is soft.     Tenderness: There is no abdominal tenderness.  Musculoskeletal:        General: No swelling or deformity.     Cervical back: Neck supple.  Lymphadenopathy:     Cervical: No cervical adenopathy.  Skin:    General: Skin is warm and dry.     Findings: No rash.  Neurological:     General: No focal deficit present.     Mental Status: She is alert.     Coordination: Coordination normal.  Psychiatric:        Mood and Affect: Mood normal.        Behavior: Behavior normal.      CBC    Component Value Date/Time   WBC 6.1 11/30/2018 1135   WBC 6.6 06/14/2016 1456   RBC 4.53 11/30/2018 1135   RBC 3.83 06/14/2016 1456   HGB 14.0 11/30/2018 1135   HCT 40.6 11/30/2018 1135   PLT 354 11/30/2018 1135   MCV 90 11/30/2018 1135   MCH 30.9  11/30/2018 1135   MCH 31.9 06/14/2016 1456   MCHC 34.5 11/30/2018 1135   MCHC 33.9 06/14/2016 1456   RDW 12.4 11/30/2018 1135   LYMPHSABS 1.8 11/30/2018 1135   MONOABS 726 06/14/2016 1456   EOSABS 0.2 11/30/2018 1135   BASOSABS 0.0 11/30/2018 1135    CHEMISTRY No results for input(s): NA, K, CL, CO2, GLUCOSE, BUN, CREATININE,  CALCIUM, MG, PHOS in the last 168 hours. CrCl cannot be calculated (Patient's most recent lab result is older than the maximum 21 days allowed.).   Chest Imaging- films reviewed: CXR, 2 view 02/01/2018- normal CXR  Pulmonary Functions Testing Results: No flowsheet data found.      Assessment & Plan:     ICD-10-CM   1. SOB (shortness of breath)  R06.02 Pulmonary function test    D-dimer, quantitative (not at [facility])    Chronic dyspnea. Episodes of decreased saturations do not seem to correlate well with a pulmonary cause, but could be explained by decreased CO if her HR had dropped to the degree she reports. Most pulmonary causes would not cause desaturation only at rest and be intermittent rather than persistent.  No known history of anemia. -Recommend obtaining a new pulse oximeter to verify saturations.  Avoid using pulse oximeter on digits with Raynaud's. -Trial of Flovent twice daily given history of allergies.  Instructed to rinse her mouth after every use. -Continue albuterol as needed -PFTs -D-dimer, although low suspicion for VTE causing periodic rather than persistent desaturations. -Recommend decreasing pets in the house -Continue allergy medications  Elevated D-dimer, previous history of large right hip surgery -CTA chest -Lower extremity ultrasound -I called to discuss this change in plan with her over the phone.  RTC in 2 months after PFTs.    Current Outpatient Medications:  .  ALPRAZolam (XANAX) 0.25 MG tablet, TAKE 1 TABLET(0.25 MG) BY MOUTH TWICE DAILY AS NEEDED FOR ANXIETY, Disp: 12 tablet, Rfl: 0 .  Ascorbic Acid (VITAMIN C ER PO), Take by mouth., Disp: , Rfl:  .  DULoxetine (CYMBALTA) 60 MG capsule, Take 1 capsule (60 mg total) by mouth daily. (Patient taking differently: Take 120 mg by mouth daily. ), Disp: 60 capsule, Rfl: 0 .  flecainide (TAMBOCOR) 50 MG tablet, Take 1 tablet by mouth 2 (two) times daily., Disp: , Rfl:  .  GLUCOS-CHON-MSM-CA-C-CTCL-SECU  PO, Take by mouth., Disp: , Rfl:  .  lamoTRIgine (LAMICTAL) 200 MG tablet, Take 200 mg by mouth daily., Disp: , Rfl:  .  levocetirizine (XYZAL) 5 MG tablet, Take 5 mg by mouth every evening., Disp: , Rfl:  .  metoprolol succinate (TOPROL-XL) 25 MG 24 hr tablet, Take 25 mg by mouth daily., Disp: , Rfl:  .  Multiple Vitamins-Minerals (MULTIVITAMIN WITH MINERALS) tablet, Take 1 tablet by mouth daily., Disp: , Rfl:  .  Omega-3 Fatty Acids (FISH OIL PO), Take by mouth., Disp: , Rfl:  .  RESVERATROL PO, Take by mouth., Disp: , Rfl:  .  TRAZODONE HCL PO, Take 50 mg by mouth at bedtime., Disp: , Rfl:  .  albuterol (VENTOLIN HFA) 108 (90 Base) MCG/ACT inhaler, Inhale 2 puffs into the lungs every 6 (six) hours as needed for wheezing or shortness of breath., Disp: 18 g, Rfl: 11 .  fluticasone (FLOVENT HFA) 110 MCG/ACT inhaler, Inhale 1 puff into the lungs in the morning and at bedtime., Disp: 1 Inhaler, Rfl: [facility] 04/15/2019 4:28 PM

## 2019-04-15 NOTE — Patient Instructions (Addendum)
Thank you for visiting Dr. Chestine Spore at [facility]. We recommend the following: Orders Placed This Encounter  Procedures  . D-dimer, quantitative (not at [facility])  . Pulmonary function test   Orders Placed This Encounter  Procedures  . D-dimer, quantitative (not at [facility])  . Pulmonary function test    Standing Status:   Future    Standing Expiration Date:   04/14/2020    Order Specific Question:   Where should this test be performed?    Answer:   [facility]    Order Specific Question:   Full PFT: includes the following: basic spirometry, spirometry pre & post bronchodilator, diffusion capacity (DLCO), lung volumes    Answer:   Full PFT    Meds ordered this encounter  Medications  . fluticasone (FLOVENT HFA) 110 MCG/ACT inhaler    Sig: Inhale 1 puff into the lungs in the morning and at bedtime.    Dispense:  1 Inhaler    Refill:  12  . albuterol (VENTOLIN HFA) 108 (90 Base) MCG/ACT inhaler    Sig: Inhale 2 puffs into the lungs every 6 (six) hours as needed for wheezing or shortness of breath.    Dispense:  18 g    Refill:  11    Return in about 2 months (around 06/15/2019). after PFTs    Please do your part to reduce the spread of COVID-19.

## 2019-04-16 ENCOUNTER — Other Ambulatory Visit: Payer: Self-pay | Admitting: Critical Care Medicine

## 2019-04-16 DIAGNOSIS — R0602 Shortness of breath: Secondary | ICD-10-CM

## 2019-04-17 ENCOUNTER — Ambulatory Visit (INDEPENDENT_AMBULATORY_CARE_PROVIDER_SITE_OTHER)
Admission: RE | Admit: 2019-04-17 | Discharge: 2019-04-17 | Disposition: A | Discharge status: Home / Self Care | Payer: 59 | Source: Ambulatory Visit | Attending: Critical Care Medicine | Admitting: Critical Care Medicine

## 2019-04-17 ENCOUNTER — Other Ambulatory Visit: Payer: Self-pay

## 2019-04-17 DIAGNOSIS — R0602 Shortness of breath: Secondary | ICD-10-CM | POA: Diagnosis not present

## 2019-04-17 MED ORDER — IOHEXOL 350 MG/ML SOLN
80.0000 mL | Freq: Once | INTRAVENOUS | Status: AC | PRN
  Administered 2019-04-17: 80 mL via INTRAVENOUS

## 2019-04-18 ENCOUNTER — Ambulatory Visit (HOSPITAL_COMMUNITY)
Admission: RE | Admit: 2019-04-18 | Discharge: 2019-04-18 | Disposition: A | Discharge status: Home / Self Care | Payer: 59 | Source: Ambulatory Visit | Attending: Internal Medicine | Admitting: Internal Medicine

## 2019-04-18 ENCOUNTER — Other Ambulatory Visit: Payer: Self-pay

## 2019-04-18 DIAGNOSIS — R0602 Shortness of breath: Secondary | ICD-10-CM

## 2019-04-18 NOTE — Progress Notes (Signed)
Please let her know she doesn't have DVTs in her legs. Thanks!

## 2019-04-18 NOTE — Progress Notes (Signed)
Please let Ms Christine Schneider know that her CT scan was normal- no blood clots or other abnormalities in her lungs. Thanks!

## 2019-05-06 ENCOUNTER — Ambulatory Visit: Payer: 59 | Admitting: Family Medicine

## 2019-05-08 ENCOUNTER — Other Ambulatory Visit: Payer: Self-pay

## 2019-05-08 ENCOUNTER — Ambulatory Visit
Admission: RE | Admit: 2019-05-08 | Discharge: 2019-05-08 | Disposition: A | Discharge status: Home / Self Care | Payer: 59 | Source: Ambulatory Visit | Attending: Family Medicine | Admitting: Family Medicine

## 2019-05-08 ENCOUNTER — Ambulatory Visit (INDEPENDENT_AMBULATORY_CARE_PROVIDER_SITE_OTHER): Payer: 59 | Admitting: Family Medicine

## 2019-05-08 ENCOUNTER — Encounter: Payer: Self-pay | Admitting: Family Medicine

## 2019-05-08 VITALS — BP 120/80 | HR 89 | Temp 98.6°F | Wt 210.8 lb

## 2019-05-08 DIAGNOSIS — S99911A Unspecified injury of right ankle, initial encounter: Secondary | ICD-10-CM | POA: Diagnosis not present

## 2019-05-08 NOTE — Progress Notes (Signed)
   Subjective:    Patient ID: Christine Schneider, female    DOB: [birth year], 29 y.o.   MRN: [id number]  HPI Chief Complaint  Patient presents with  . foot injury    foot injury on right foot- 2 weeks slipped out of shower and then dragged by dog on gravel. not getting better, swelling   Complains of right ankle and lower anterior leg pain for the past 12 days. States she stepped out of her shower and the rug which was usually there wasn't  so she slid across the floor and rammed her lower leg into the vanity.   States she is having worsening pain mostly to the dorsal and lateral aspects of her right foot. Significant bruising and swelling.  States she bought a brace from [employer].  No numbness, tingling or weakness.   Taking ibuprofen 800 mg twice daily, elevating and icing for pain control.   States [facility] is her orthopedist.   No fever, chills, N/V  Review of Systems Pertinent positives and negatives in the history of present illness.     Objective:   Physical Exam Constitutional:      General: She is not in acute distress.    Appearance: She is not ill-appearing.  Musculoskeletal:     Right ankle: Swelling and ecchymosis present. Tenderness present over the lateral malleolus. Normal pulse.     Right Achilles Tendon: Normal.     Right foot: Normal range of motion and normal capillary refill. Normal pulse.       Legs:  Neurological:     Mental Status: She is alert.    BP 120/80   Pulse 89   Temp 98.6 F (37 C)   Wt 210 lb 12.8 oz (95.6 kg)   BMI 36.18 kg/m        Assessment & Plan:  Injury of right ankle, initial encounter - Plan: DG Ankle Complete Right  Discussed RICE for pain control. I will send her for an XR and have her follow up with her orthopedist.

## 2019-07-05 ENCOUNTER — Other Ambulatory Visit (HOSPITAL_COMMUNITY): Payer: 59

## 2019-07-16 ENCOUNTER — Telehealth: Payer: Self-pay | Admitting: Family Medicine

## 2019-07-16 NOTE — Telephone Encounter (Signed)
Dismissal letter in guarantor snapshot  °

## 2019-09-26 ENCOUNTER — Other Ambulatory Visit: Payer: Self-pay

## 2019-09-26 ENCOUNTER — Encounter: Payer: Self-pay | Admitting: Family Medicine

## 2019-09-26 ENCOUNTER — Ambulatory Visit (INDEPENDENT_AMBULATORY_CARE_PROVIDER_SITE_OTHER): Payer: Self-pay | Admitting: Family Medicine

## 2019-09-26 VITALS — BP 120/70 | HR 68 | Temp 97.5°F | Wt 211.2 lb

## 2019-09-26 DIAGNOSIS — R7303 Prediabetes: Secondary | ICD-10-CM

## 2019-09-26 DIAGNOSIS — R3915 Urgency of urination: Secondary | ICD-10-CM

## 2019-09-26 DIAGNOSIS — R350 Frequency of micturition: Secondary | ICD-10-CM

## 2019-09-26 DIAGNOSIS — Z8632 Personal history of gestational diabetes: Secondary | ICD-10-CM

## 2019-09-26 DIAGNOSIS — R631 Polydipsia: Secondary | ICD-10-CM

## 2019-09-26 LAB — POCT URINALYSIS DIP (PROADVANTAGE DEVICE)
Bilirubin, UA: NEGATIVE
Blood, UA: NEGATIVE
Glucose, UA: NEGATIVE mg/dL
Ketones, POC UA: NEGATIVE mg/dL
Leukocytes, UA: NEGATIVE
Nitrite, UA: NEGATIVE
Specific Gravity, Urine: 1.03
Urobilinogen, Ur: NEGATIVE
pH, UA: 5.5 (ref 5.0–8.0)

## 2019-09-26 LAB — POCT GLYCOSYLATED HEMOGLOBIN (HGB A1C): Hemoglobin A1C: 5.5 % (ref 4.0–5.6)

## 2019-09-26 LAB — POCT URINE PREGNANCY: Preg Test, Ur: NEGATIVE

## 2019-09-26 LAB — GLUCOSE, POCT (MANUAL RESULT ENTRY): POC Glucose: 105 mg/dl — AB (ref 70–99)

## 2019-09-26 NOTE — Progress Notes (Signed)
Subjective:  Christine Schneider is a 29 y.o. female who complains of possible urinary tract infection.  She has had symptoms for 1 week.  Symptoms include urinary frequency, urgency, fatigue, increased thirst, nausea. Patient denies fever, chills, back pain, abomindal pain, vomiting, diarrhea, vaginal discharge.  Last UTI was 3-4 years ago.    Requests pregnancy test. LMP: 09/07/2019  Negative Covid test. States she had this because of her symptoms. She also had more GI upset over a week ago.   Fasting right now.  History of prediabetes and gestational diabetes.   Patient does not have a history of recurrent UTI. Patient does not have a history of pyelonephritis.  No other aggravating or relieving factors.  No other c/o.  Past Medical History:  Diagnosis Date   Abrasion of hand 09/09/2013   bilateral   Anxiety    Complication of anesthesia    hair loss after anesthesia   Depression    Dysrhythmia    svt  treated   Elevated ALT measurement 02/05/2018   GERD (gastroesophageal reflux disease)    OTC as needed   H/O hiatal hernia    High cholesterol    no current med.   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    Objective: Vitals:   09/26/19 0950  BP: 120/70  Pulse: 68  Temp: (!) 97.5 F (36.4 C)    General appearance: alert, no distress, WD/WN, female Abdomen: +bs, soft, non tender, non distended, no masses, no hepatomegaly,  no splenomegaly, no bruits Back: no CVA tenderness GU: declines      Laboratory:  Urine dipstick: trace for protein.    CBG fasting 105 Hgb A1c 5.5% UPT negative    Assessment: Frequent urination - Plan: POCT Urinalysis DIP (Proadvantage Device), POCT urine pregnancy, POCT glucose (manual entry), Urine Culture, CANCELED: Glucose, Random  Polydipsia - Plan: CANCELED: Glucose, Random  Urinary urgency - Plan: POCT glucose (manual entry), Urine Culture, CANCELED: Glucose, Random  Prediabetes - Plan: HgB A1c  History of gestational diabetes - Plan: CANCELED: Glucose, Random    Plan: No sign of diabetes or infection. Negative pregnancy although she desires to become pregnant. Discussed to recheck home UPT in one week.  Advised increased water intake, can use OTC Tylenol for pain.    Advised to follow up if worsening or not back to baseline. No antibiotic prescribed today.   Urine culture sent and I will follow up with result.

## 2019-09-27 LAB — URINE CULTURE

## 2019-10-17 ENCOUNTER — Encounter: Payer: Self-pay | Admitting: Family Medicine

## 2019-10-17 ENCOUNTER — Telehealth: Payer: Self-pay | Admitting: Medical

## 2019-10-17 NOTE — Telephone Encounter (Signed)
Get in for appt friday

## 2019-10-18 ENCOUNTER — Emergency Department (HOSPITAL_COMMUNITY): Payer: BC Managed Care – PPO

## 2019-10-18 ENCOUNTER — Encounter (HOSPITAL_COMMUNITY): Payer: Self-pay

## 2019-10-18 ENCOUNTER — Emergency Department (HOSPITAL_COMMUNITY)
Admission: EM | Admit: 2019-10-18 | Discharge: 2019-10-19 | Disposition: A | Discharge status: Home / Self Care | Payer: BC Managed Care – PPO | Attending: Emergency Medicine | Admitting: Emergency Medicine

## 2019-10-18 DIAGNOSIS — J45909 Unspecified asthma, uncomplicated: Secondary | ICD-10-CM | POA: Diagnosis not present

## 2019-10-18 DIAGNOSIS — Z79899 Other long term (current) drug therapy: Secondary | ICD-10-CM | POA: Diagnosis not present

## 2019-10-18 DIAGNOSIS — R079 Chest pain, unspecified: Secondary | ICD-10-CM | POA: Diagnosis not present

## 2019-10-18 HISTORY — DX: Unspecified asthma, uncomplicated: J45.909

## 2019-10-18 LAB — BASIC METABOLIC PANEL
Anion gap: 10 (ref 5–15)
BUN: 8 mg/dL (ref 6–20)
CO2: 27 mmol/L (ref 22–32)
Calcium: 9.4 mg/dL (ref 8.9–10.3)
Chloride: 102 mmol/L (ref 98–111)
Creatinine, Ser: 0.81 mg/dL (ref 0.44–1.00)
GFR calc Af Amer: >60 mL/min (ref >60)
GFR calc non Af Amer: >60 mL/min (ref >60)
Glucose, Bld: 112 mg/dL — ABNORMAL HIGH (ref 70–99)
Potassium: 4.1 mmol/L (ref 3.5–5.1)
Sodium: 139 mmol/L (ref 135–145)

## 2019-10-18 LAB — CBC
HCT: 44.6 % (ref 36.0–46.0)
Hemoglobin: 14.5 g/dL (ref 12.0–15.0)
MCH: 30.6 pg (ref 26.0–34.0)
MCHC: 32.5 g/dL (ref 30.0–36.0)
MCV: 94.1 fL (ref 80.0–100.0)
Platelets: 290 10*3/uL (ref 150–400)
RBC: 4.74 MIL/uL (ref 3.87–5.11)
RDW: 12.1 % (ref 11.5–15.5)
WBC: 5.8 10*3/uL (ref 4.0–10.5)
nRBC: 0 % (ref 0.0–0.2)

## 2019-10-18 LAB — TROPONIN I (HIGH SENSITIVITY)
Troponin I (High Sensitivity): <2 ng/L (ref <18)
Troponin I (High Sensitivity): <2 ng/L (ref <18)

## 2019-10-18 LAB — I-STAT BETA HCG BLOOD, ED (MC, WL, AP ONLY): I-stat hCG, quantitative: <5 m[IU]/mL (ref <5)

## 2019-10-18 NOTE — ED Triage Notes (Signed)
Pt reports that chest pain started last night around 6pm, no SOB, N/V. Pt states has history of SVT, with ablation. Pt reports pain of 7/10 centrally located radiating to back.

## 2019-10-18 NOTE — Telephone Encounter (Signed)
Pt. Scheduled on Monday to [facility] for head aches.

## 2019-10-18 NOTE — Telephone Encounter (Signed)
Called pt. LM to see if she wanted to come in today for an apt for her head aches.

## 2019-10-19 ENCOUNTER — Encounter (HOSPITAL_COMMUNITY): Payer: Self-pay | Admitting: Student

## 2019-10-19 LAB — HEPATIC FUNCTION PANEL
ALT: 61 U/L — ABNORMAL HIGH (ref 0–44)
AST: 30 U/L (ref 15–41)
Albumin: 4.3 g/dL (ref 3.5–5.0)
Alkaline Phosphatase: 74 U/L (ref 38–126)
Bilirubin, Direct: <0.1 mg/dL (ref 0.0–0.2)
Total Bilirubin: 0.5 mg/dL (ref 0.3–1.2)
Total Protein: 7.5 g/dL (ref 6.5–8.1)

## 2019-10-19 LAB — LIPASE, BLOOD: Lipase: 36 U/L (ref 11–51)

## 2019-10-19 MED ORDER — ALUM & MAG HYDROXIDE-SIMETH 200-200-20 MG/5ML PO SUSP
30.0000 mL | Freq: Once | ORAL | Status: AC
  Administered 2019-10-19: 30 mL via ORAL
  Filled 2019-10-19: qty 30

## 2019-10-19 MED ORDER — NAPROXEN 250 MG PO TABS
500.0000 mg | ORAL_TABLET | Freq: Once | ORAL | Status: AC
  Administered 2019-10-19: 500 mg via ORAL
  Filled 2019-10-19: qty 2

## 2019-10-19 MED ORDER — LIDOCAINE VISCOUS HCL 2 % MT SOLN
15.0000 mL | Freq: Once | OROMUCOSAL | Status: AC
  Administered 2019-10-19: 15 mL via ORAL
  Filled 2019-10-19: qty 15

## 2019-10-19 MED ORDER — HYDROXYZINE HCL 10 MG PO TABS
10.0000 mg | ORAL_TABLET | Freq: Once | ORAL | Status: AC
  Administered 2019-10-19: 10 mg via ORAL
  Filled 2019-10-19: qty 1

## 2019-10-19 MED ORDER — HYDROXYZINE HCL 10 MG PO TABS: 10.0000 mg | ORAL_TABLET | Freq: Three times a day (TID) | ORAL | 0 refills | Status: DC | PRN

## 2019-10-19 NOTE — ED Notes (Signed)
Patient verbalizes understanding of discharge instructions. Opportunity for questioning and answers were provided. Armband removed by staff, pt discharged from ED and ambulated to lobby to return home.   

## 2019-10-19 NOTE — Discharge Instructions (Addendum)
You were seen in the emergency department today for chest pain. Your work-up in the emergency department has been overall reassuring. Your labs have been fairly normal and or similar to previous blood work you have had done. Your EKG and the enzyme we use to check your heart did not show an acute heart attack at this time. Your chest x-ray was normal.   We are sending you home with hydroxyzine to take every 8 hours as needed for anxiety or chest tightness. We have prescribed you new medication(s) today. Discuss the medications prescribed today with your pharmacist as they can have adverse effects and interactions with your other medicines including over the counter and prescribed medications. Seek medical evaluation if you start to experience new or abnormal symptoms after taking one of these medicines, seek care immediately if you start to experience difficulty breathing, feeling of your throat closing, facial swelling, or rash as these could be indications of a more serious allergic reaction  We would like you to follow up closely with your primary care provider and/or the cardiologist provided in your discharge instructions within 1-3 days. Return to the ER immediately should you experience any new or worsening symptoms including but not limited to return of pain, worsened pain, vomiting, shortness of breath, dizziness, lightheadedness, passing out, or any other concerns that you may have.

## 2019-10-19 NOTE — ED Provider Notes (Signed)
[facility] Provider Note   CSN: [id number] Arrival date & time: 10/18/19  1139     History Chief Complaint  Patient presents with  . Chest Pain    Rolan BuccoGabrielle A Nelson is a 29 y.o. female with a history of SVT status post ablation, anxiety, asthma, GERD, hyperlipidemia, and PCOS who presents to the ED with complaints of chest pain x 2 days. Patient states pain is to her anterior central chest, radiates throughout the chest & into the back, feels like a burning pain. Current discomfort is a 7/10 in severity, it is worse with wearing her bra & with the collar of her shirt coming up too high, no alleviating factors. Tried PPI without relief. She has had increased stress, but figured if this was anxiety it would have gone away. She denies fever, chills, nausea, vomiting, diaphoresis, syncope, shortness of breath, abdominal pain, leg pain/swelling, hemoptysis, recent surgery/trauma, recent long travel, hormone use, personal hx of cancer, or hx of DVT/PE.    HPI     Past Medical History:  Diagnosis Date  . Abrasion of hand 09/09/2013   bilateral  . Anxiety   . Asthma   . Complication of anesthesia    hair loss after anesthesia  . Depression   . Dysrhythmia    svt  treated  . Elevated ALT measurement 02/05/2018  . GERD (gastroesophageal reflux disease)    OTC as needed  . H/O hiatal hernia   . High cholesterol    no current med.  . Hip joint pain 08/2013   right - states has a problem with labrum  . Mixed hyperlipidemia   . Multiple allergies    states is allergic to everything except ragweed and beef  . Narcotic-induced nausea and vomiting    states needs Phenergan if Percocet is prescribed  . PCOS (polycystic ovarian syndrome)   . Pollen allergy   . PONV (postoperative nausea and vomiting)   . Restless leg syndrome   . SVT (supraventricular tachycardia) (HCC)   . 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 Mixed hyperlipidemia   . Hypoxic episode 11/30/2018  . Hearing reduced, bilateral 11/30/2018  . Chronic pain of right hip 06/15/2018  . Elevated ALT measurement 02/05/2018  . SVT (supraventricular tachycardia) (HCC) 02/01/2018  . History of gestational diabetes 08/28/2017  . Alopecia areata 08/28/2017  . Hair thinning 08/28/2017  . Fatigue 03/08/2017  . Hot flashes 03/08/2017  . Weight gain 03/08/2017  . Polydipsia 03/08/2017  . IUD (intrauterine device) in place 03/08/2017  . Viral gastroenteritis 06/14/2016  . ADD (attention deficit disorder) 05/27/2016  . Anxiety and depression 05/27/2016  . Bipolar depression (HCC) 05/27/2016  . History of atrial flutter 05/27/2016    Past Surgical History:  Procedure Laterality Date  . ABLATION    . HIP SURGERY Right 07/07/2011  . ULNAR NERVE TRANSPOSITION Right 09/13/2013   Procedure: RIGHT ELBOW ULNAR NERVE RELEASE ANTERIOR TRANSPOSITION WITH REPAIR AND RECONSTRUCTION ;  Surgeon: Dominica SeverinWilliam Gramig, MD;  Location: [facility];  Service: Orthopedics;  Laterality: Right;     OB History    Gravida  1   Para      Term      Preterm      AB      Living        SAB      TAB      Ectopic  Multiple      Live Births              Family History  Problem Relation Age of Onset  . Hyperlipidemia Mother   . Hypertension Father   . Depression Sister     Social History   Tobacco Use  . Smoking status: Never Smoker  . Smokeless tobacco: Never Used  Vaping Use  . Vaping Use: Never used  Substance Use Topics  . Alcohol use: No  . Drug use: No    Home Medications Prior to Admission medications   Medication Sig Start Date End Date Taking? Authorizing Provider  albuterol (VENTOLIN HFA) 108 (90 Base) MCG/ACT inhaler Inhale 2 puffs into the lungs every 6 (six) hours as needed for wheezing or shortness of breath. 04/15/19   Chestine Spore, Virl Axe, DO  ALPRAZolam (XANAX) 0.25 MG tablet TAKE 1  TABLET(0.25 MG) BY MOUTH TWICE DAILY AS NEEDED FOR ANXIETY 04/06/18   Henson, Vickie L, NP-C  ARIPiprazole (ABILIFY) 2 MG tablet Take 2 mg by mouth daily.    [provider]  Ascorbic Acid (VITAMIN C ER PO) Take by mouth.    [provider]  DULoxetine (CYMBALTA) 60 MG capsule Take 1 capsule (60 mg total) by mouth daily. Patient taking differently: Take 120 mg by mouth daily.  07/27/16   Henson, Vickie L, NP-C  flecainide (TAMBOCOR) 50 MG tablet Take 1 tablet by mouth 2 (two) times daily. 11/20/17   [provider]  fluticasone (FLOVENT HFA) 110 MCG/ACT inhaler Inhale 1 puff into the lungs in the morning and at bedtime. 04/15/19   Steffanie Dunn, DO  GLUCOS-CHON-MSM-CA-C-CTCL-SECU PO Take by mouth.    [provider]  hydrOXYzine (VISTARIL) 25 MG capsule hydroxyzine pamoate 25 mg capsule    [provider]  lamoTRIgine (LAMICTAL) 200 MG tablet Take 200 mg by mouth daily.    [provider]  levocetirizine (XYZAL) 5 MG tablet Take 5 mg by mouth every evening.    [provider]  metoprolol succinate (TOPROL-XL) 25 MG 24 hr tablet Take 25 mg by mouth daily.    [provider]  Multiple Vitamins-Minerals (MULTIVITAMIN WITH MINERALS) tablet Take 1 tablet by mouth daily.    [provider]  Omega-3 Fatty Acids (FISH OIL PO) Take by mouth.    [provider]  RESVERATROL PO Take by mouth.    [provider]  TRAZODONE HCL PO Take 50 mg by mouth at bedtime.    [provider]    Allergies    Percocet [oxycodone-acetaminophen] and Hydromorphone  Review of Systems   Review of Systems  Constitutional: Negative for chills, diaphoresis and fever.  Respiratory: Negative for shortness of breath.   Cardiovascular: Positive for chest pain. Negative for leg swelling.  Gastrointestinal: Negative for abdominal pain, constipation, diarrhea, nausea and vomiting.  Genitourinary: Negative for dysuria.    Neurological: Negative for syncope, weakness and numbness.  All other systems reviewed and are negative.   Physical Exam Updated Vital Signs BP 134/79   Pulse 82   Temp 98.6 F (37 C) (Oral)   Resp 15   SpO2 100%   Physical Exam Vitals and nursing note reviewed.  Constitutional:      General: She is not in acute distress.    Appearance: She is well-developed. She is not toxic-appearing.  HENT:     Head: Normocephalic and atraumatic.  Eyes:     General:        Right  eye: No discharge.        Left eye: No discharge.     Conjunctiva/sclera: Conjunctivae normal.  Cardiovascular:     Rate and Rhythm: Normal rate and regular rhythm.     Pulses:          Radial pulses are 2+ on the right side and 2+ on the left side.  Pulmonary:     Effort: Pulmonary effort is normal. No respiratory distress.     Breath sounds: Normal breath sounds. No wheezing, rhonchi or rales.  Chest:     Chest wall: Tenderness (Anterior chest wall) present.  Abdominal:     General: There is no distension.     Palpations: Abdomen is soft.     Tenderness: There is no abdominal tenderness. There is no guarding or rebound.  Musculoskeletal:     Cervical back: Neck supple.     Right lower leg: No tenderness. No edema.     Left lower leg: No tenderness. No edema.  Skin:    General: Skin is warm and dry.     Findings: No rash.  Neurological:     Mental Status: She is alert.     Comments: Clear speech.   Psychiatric:        Behavior: Behavior normal.     ED Results / Procedures / Treatments   Labs (all labs ordered are listed, but only abnormal results are displayed) Labs Reviewed  BASIC METABOLIC PANEL - Abnormal; Notable for the following components:      Result Value   Glucose, Bld 112 (*)    All other components within normal limits  HEPATIC FUNCTION PANEL - Abnormal; Notable for the following components:   ALT 61 (*)    All other components within normal limits  CBC  LIPASE, BLOOD  I-STAT  BETA HCG BLOOD, ED ([facility], [facility], [facility] ONLY)  TROPONIN I (HIGH SENSITIVITY)  TROPONIN I (HIGH SENSITIVITY)    EKG EKG Interpretation  Date/Time:  Friday October 18 2019 11:50:48 EDT Ventricular Rate:  83 PR Interval:  154 QRS Duration: 84 QT Interval:  362 QTC Calculation: 425 R Axis:   54 Text Interpretation: Normal sinus rhythm Normal ECG Confirmed by Gilda Crease [phone]) on 10/19/2019 6:20:55 AM   Radiology DG Chest 2 View  Result Date: 10/18/2019 CLINICAL DATA:  Chest pain. EXAM: CHEST - 2 VIEW COMPARISON:  02/01/2018 FINDINGS: The heart size and mediastinal contours are within normal limits. There is no evidence of pulmonary edema, consolidation, pneumothorax, nodule or pleural fluid. The visualized skeletal structures are unremarkable. IMPRESSION: No active cardiopulmonary disease. Electronically Signed   By: Irish Lack M.D.   On: 10/18/2019 12:30    Procedures Procedures (including critical care time)  Medications Ordered in ED Medications - No data to display  ED Course  I have reviewed the triage vital signs and the nursing notes.  Pertinent labs & imaging results that were available during my care of the patient were reviewed by me and considered in my medical decision making (see chart for details).    MDM Rules/Calculators/A&P                          Patient presents to the emergency department with chest pain. Patient nontoxic appearing, in no apparent distress, vitals without significant abnormality. Fairly benign physical exam.   DDX: ACS, pulmonary embolism, dissection, pneumothorax, pneumonia, arrhythmia, severe anemia, MSK, GERD, anxiety, pancreatitis, cholecystitis, cholelithiasis, pericarditis   Additional history  obtained:  Additional history obtained from chart review & nursing note review.  EKG: No STEMI or findings to suggest pericarditis, no arrhythmia.  Lab Tests:  I reviewed and interpreted labs, which included:  CBC, BMP, hepatic  function panel, lipase, preg test, troponins: Fairly unremarkable, mild elevation in ALT- t bili & AST WNL, trops negative, no significant electrolyte derangement or anemia.   Imaging Studies ordered:  CXR ordered per triage, I independently visualized and interpreted imaging which showed no acute process.   Hear score low risk- EKG without obvious acute ischemia, delta troponin negative, doubt ACS. Patient is low risk wells, PERC negative, doubt pulmonary embolism. Pain is not a tearing sensation, symmetric pulses, no widening of mediastinum on CXR, normotensive, doubt dissection.  Abdomen remains nontender, doubt cholecystitis, pancreatitis, or symptomatic cholelithiasis.  No improvement status post GI cocktail, trial naproxen and hydroxyzine which patient states she thinks might of helped, she states it is hard to tell because she is very tired.  She would like to try hydroxyzine as needed at home.  She overall appears appropriate for discharge at this time.  I discussed results, treatment plan, need for PCP follow-up, and return precautions with the patient. Provided opportunity for questions, patient confirmed understanding and is in agreement with plan.   Portions of this note were generated with Scientist, clinical (histocompatibility and immunogenetics). Dictation errors may occur despite best attempts at proofreading.  Final Clinical Impression(s) / ED Diagnoses Final diagnoses:  Chest pain, unspecified type    Rx / DC Orders ED Discharge Orders         Ordered    hydrOXYzine (ATARAX/VISTARIL) 10 MG tablet  Every 8 hours PRN        10/19/19 0621           Cherly Anderson, PA-C 10/19/19 0622    Gilda Crease, MD 10/19/19 [phone]

## 2019-10-21 ENCOUNTER — Ambulatory Visit (INDEPENDENT_AMBULATORY_CARE_PROVIDER_SITE_OTHER): Payer: BC Managed Care – PPO | Admitting: Family Medicine

## 2019-10-21 ENCOUNTER — Other Ambulatory Visit: Payer: Self-pay

## 2019-10-21 ENCOUNTER — Encounter: Payer: Self-pay | Admitting: Family Medicine

## 2019-10-21 VITALS — BP 120/72 | HR 76 | Temp 98.0°F | Wt 213.2 lb

## 2019-10-21 DIAGNOSIS — G44229 Chronic tension-type headache, not intractable: Secondary | ICD-10-CM

## 2019-10-21 NOTE — Progress Notes (Signed)
° °  Subjective:    Patient ID: Christine Schneider, female    DOB: [birth year], 29 y.o.   MRN: [id number]  HPI Chief Complaint  Patient presents with   headaches    headaches for the last couple weeks and can't rid of it   Complains of a 2 week history of bilateral frontal headache. This has been constant but varies in intensity. Dull mostly but occasionally sharp pain.  Pain is currently 3 out of 10.  States the pain occasionally gets as high as 6 out of 10.  No known aggravating or alleviating factors.  Has been able to function in spite of pain.  Nausea without vomiting. No vision changes, dizziness.   Hx of migraine headaches that is different.  Migraine headaches once yearly. Lasts one day typically and she vomits then migraine resolves.   States she has been taking Excedrin 2 tablets every 6 hours several days. Ibuprofen and Tylenol as well.   One cup of coffee per day. No change in caffeine intake.   Sleep is regular as well as hydration and meals.   Tried decongestant but no sinus pressure.   No fever, chills, rhinorrhea, nasal congestion, ear pain, sinus pressure, sore throat, PND. No numbness, tingling or weakness.   Reviewed allergies, medications, past medical, surgical, family, and social history.    Review of Systems Pertinent positives and negatives in the history of present illness.     Objective:   Physical Exam Constitutional:      General: She is not in acute distress.    Appearance: Normal appearance. She is not ill-appearing.  HENT:     Right Ear: Tympanic membrane and ear canal normal.     Left Ear: Tympanic membrane and ear canal normal.  Eyes:     General: Lids are normal. No visual field deficit.    Extraocular Movements: Extraocular movements intact.     Conjunctiva/sclera: Conjunctivae normal.     Pupils: Pupils are equal, round, and reactive to light.  Cardiovascular:     Rate and Rhythm: Normal rate and regular rhythm.     Pulses: Normal  pulses.  Pulmonary:     Effort: Pulmonary effort is normal.     Breath sounds: Normal breath sounds.  Lymphadenopathy:     Cervical: No cervical adenopathy.  Neurological:     General: No focal deficit present.     Mental Status: She is alert and oriented to person, place, and time.     Cranial Nerves: Cranial nerves are intact.     Motor: Motor function is intact.     Coordination: Coordination is intact.     Deep Tendon Reflexes: Reflexes are normal and symmetric.    BP 120/72    Pulse 76    Temp 98 F (36.7 C) (Oral)    Wt 213 lb 3.2 oz (96.7 kg)    SpO2 97%    BMI 36.60 kg/m        Assessment & Plan:  Chronic tension-type headache, not intractable  Current headache is not heard usual migraine she gets once yearly. Exam unremarkable. Discussed that her symptoms are vague and no obvious etiology.  Discussed stopping NSAIDs and trying Tylenol.  Encourage good hydration, sleep, small frequent meals and regular caffeine.  She will look for any new or worsening symptoms and any triggers and follow-up.

## 2019-10-21 NOTE — Patient Instructions (Signed)
Hold off on any NSAIDs including ibuprofen, Excedrin, Motrin, Aleve, Advil or any aspirin-containing products.  If you need to take pain medication, take acetaminophen or Tylenol over-the-counter.  Look for any triggers or changes in your symptoms.  Continue with regular sleep, hydration, eating small frequent meals and your usual caffeine intake.

## 2019-11-20 DIAGNOSIS — I471 Supraventricular tachycardia: Secondary | ICD-10-CM | POA: Insufficient documentation

## 2019-11-20 DIAGNOSIS — I4719 Other supraventricular tachycardia: Secondary | ICD-10-CM | POA: Insufficient documentation

## 2020-02-09 IMAGING — CR LUMBAR SPINE - COMPLETE 4+ VIEW
5 series · 5 of 5 positions shown · non-contrast
Comparison: None.

CLINICAL DATA: Lumbago for several weeks

EXAM:
LUMBAR SPINE - COMPLETE 4+ VIEW

[t l-spine a.p.]
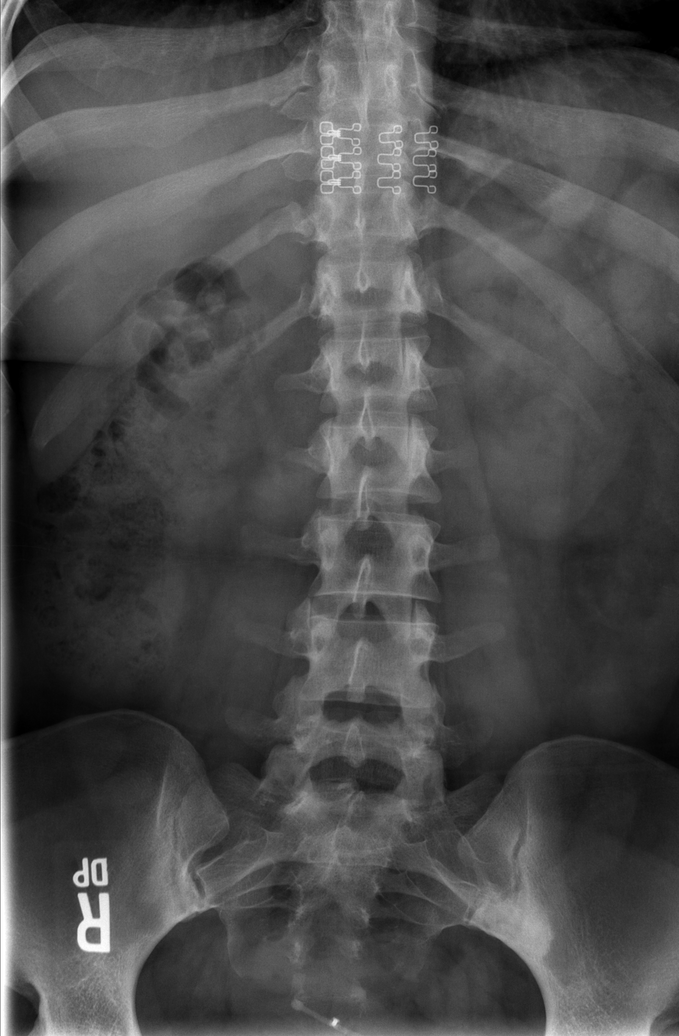

[t l-spine oblique exposure (1 of 2)]
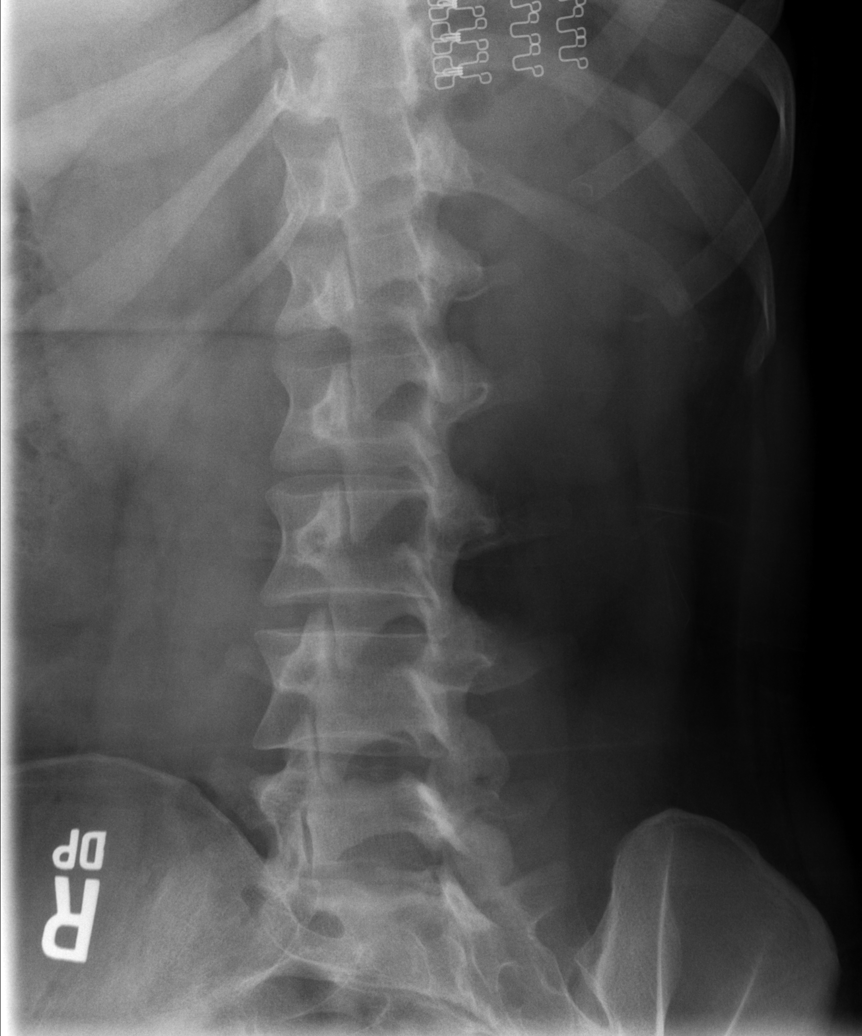

[t l-spine oblique exposure (2 of 2)]
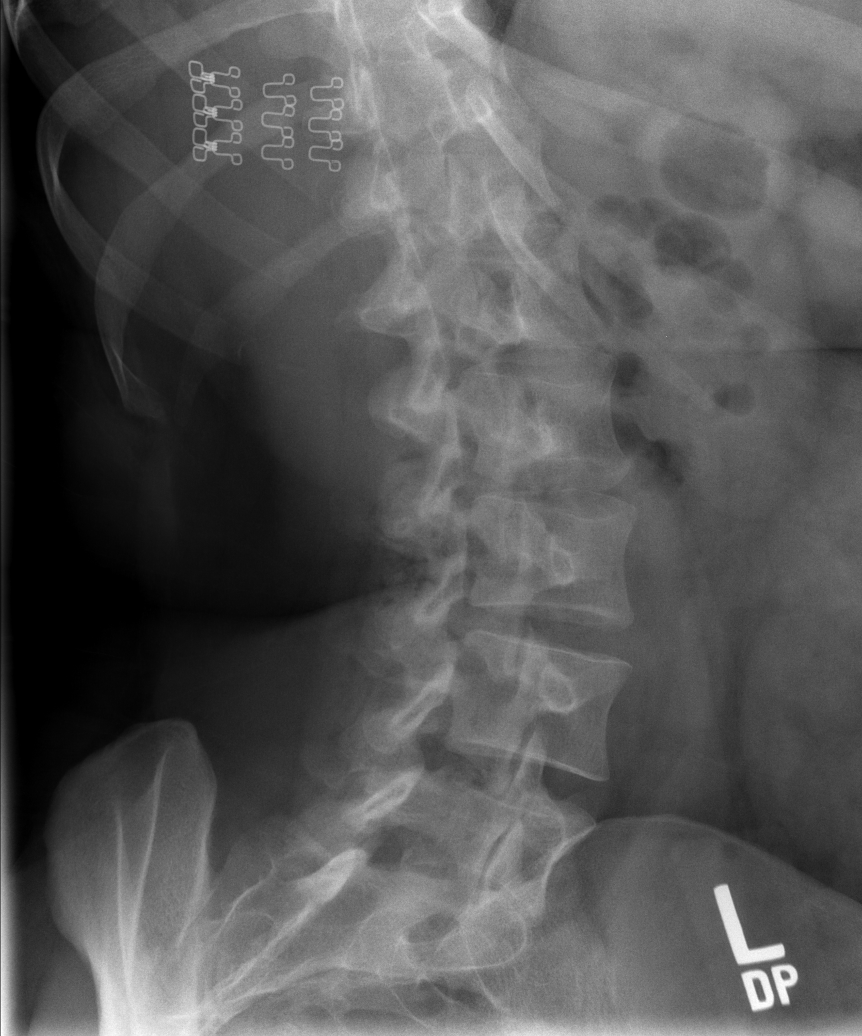

[t l-spine lat]
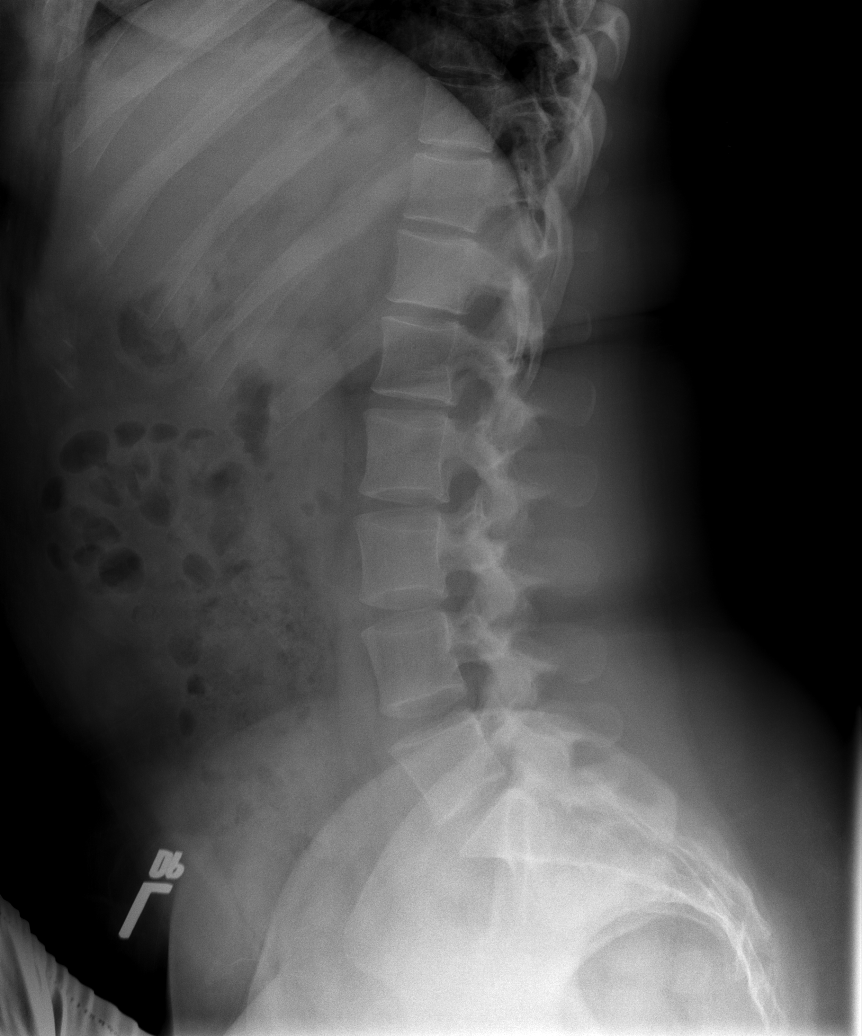

[t l-spine l5-s1 spot]
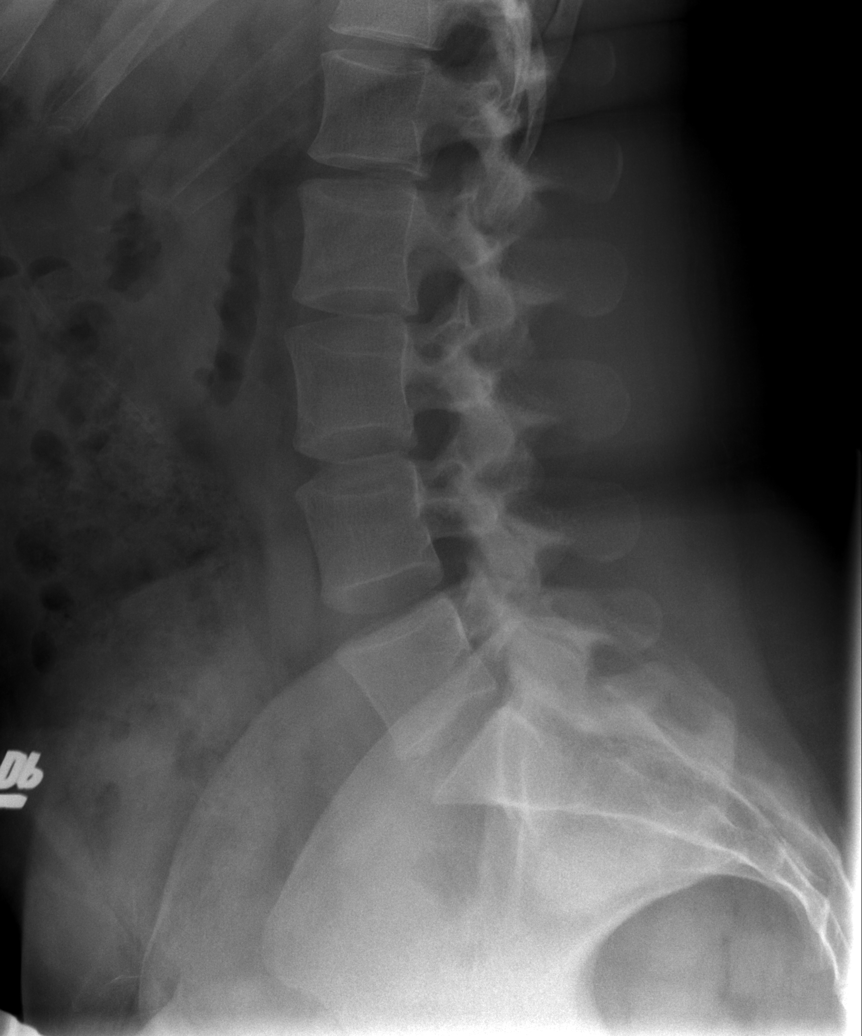

[5 of 5 positions shown; findings below may reference images not displayed]

FINDINGS: Frontal, lateral, spot lumbosacral lateral, and bilateral oblique
views were obtained. There are 5 non-rib-bearing lumbar type
vertebral bodies. There is slight lumbar levoscoliosis. There is no
evident fracture or spondylolisthesis. Disc spaces appear
unremarkable. There is no appreciable facet arthropathy. There is
mild sclerosis along the inferior sacroiliac joint region.
Intrauterine device is noted in the mid-pelvis.
IMPRESSION: Slight scoliosis. No fracture or spondylolisthesis. No appreciable
arthropathy.

There is localized osteitis condensans Yount along the inferior left
sacroiliac joint. This is a finding that potentially may represent a
source of pain.

Intrauterine device positioned in mid pelvis.

## 2020-06-03 ENCOUNTER — Encounter: Payer: Self-pay | Admitting: Family Medicine

## 2020-07-26 ENCOUNTER — Emergency Department (HOSPITAL_COMMUNITY)
Admission: EM | Admit: 2020-07-26 | Discharge: 2020-07-27 | Disposition: A | Discharge status: Home / Self Care | Payer: 59 | Attending: Emergency Medicine | Admitting: Emergency Medicine

## 2020-07-26 DIAGNOSIS — M545 Low back pain, unspecified: Secondary | ICD-10-CM | POA: Diagnosis present

## 2020-07-26 DIAGNOSIS — K8020 Calculus of gallbladder without cholecystitis without obstruction: Secondary | ICD-10-CM | POA: Diagnosis not present

## 2020-07-26 DIAGNOSIS — J45909 Unspecified asthma, uncomplicated: Secondary | ICD-10-CM | POA: Diagnosis not present

## 2020-07-26 DIAGNOSIS — R079 Chest pain, unspecified: Secondary | ICD-10-CM | POA: Insufficient documentation

## 2020-07-26 DIAGNOSIS — Z7951 Long term (current) use of inhaled steroids: Secondary | ICD-10-CM | POA: Diagnosis not present

## 2020-07-26 DIAGNOSIS — R945 Abnormal results of liver function studies: Secondary | ICD-10-CM | POA: Diagnosis not present

## 2020-07-26 DIAGNOSIS — R7989 Other specified abnormal findings of blood chemistry: Secondary | ICD-10-CM

## 2020-07-26 DIAGNOSIS — R1011 Right upper quadrant pain: Secondary | ICD-10-CM

## 2020-07-26 NOTE — ED Triage Notes (Signed)
Pt came in with c/o back pain that started approx three hours ago. Pt states that it radiated down her whole spine, but it feels like pulsing. Pt recently had a c section six weeks ago. Pt has had hip surgery in the past

## 2020-07-27 ENCOUNTER — Other Ambulatory Visit: Payer: Self-pay

## 2020-07-27 ENCOUNTER — Emergency Department (HOSPITAL_COMMUNITY): Payer: 59

## 2020-07-27 LAB — CBC WITH DIFFERENTIAL/PLATELET
Abs Immature Granulocytes: 0.05 10*3/uL (ref 0.00–0.07)
Basophils Absolute: 0 10*3/uL (ref 0.0–0.1)
Basophils Relative: 0 %
Eosinophils Absolute: 0.1 10*3/uL (ref 0.0–0.5)
Eosinophils Relative: 1 %
HCT: 41.2 % (ref 36.0–46.0)
Hemoglobin: 13.7 g/dL (ref 12.0–15.0)
Immature Granulocytes: 0 %
Lymphocytes Relative: 15 %
Lymphs Abs: 1.8 10*3/uL (ref 0.7–4.0)
MCH: 30.8 pg (ref 26.0–34.0)
MCHC: 33.3 g/dL (ref 30.0–36.0)
MCV: 92.6 fL (ref 80.0–100.0)
Monocytes Absolute: 0.7 10*3/uL (ref 0.1–1.0)
Monocytes Relative: 6 %
Neutro Abs: 9.1 10*3/uL — ABNORMAL HIGH (ref 1.7–7.7)
Neutrophils Relative %: 78 %
Platelets: 311 10*3/uL (ref 150–400)
RBC: 4.45 MIL/uL (ref 3.87–5.11)
RDW: 12.2 % (ref 11.5–15.5)
WBC: 11.9 10*3/uL — ABNORMAL HIGH (ref 4.0–10.5)
nRBC: 0 % (ref 0.0–0.2)

## 2020-07-27 LAB — COMPREHENSIVE METABOLIC PANEL
ALT: 99 U/L — ABNORMAL HIGH (ref 0–44)
AST: 84 U/L — ABNORMAL HIGH (ref 15–41)
Albumin: 4.5 g/dL (ref 3.5–5.0)
Alkaline Phosphatase: 171 U/L — ABNORMAL HIGH (ref 38–126)
Anion gap: 8 (ref 5–15)
BUN: 23 mg/dL — ABNORMAL HIGH (ref 6–20)
CO2: 27 mmol/L (ref 22–32)
Calcium: 9 mg/dL (ref 8.9–10.3)
Chloride: 104 mmol/L (ref 98–111)
Creatinine, Ser: 0.8 mg/dL (ref 0.44–1.00)
GFR, Estimated: >60 mL/min (ref >60)
Glucose, Bld: 106 mg/dL — ABNORMAL HIGH (ref 70–99)
Potassium: 4 mmol/L (ref 3.5–5.1)
Sodium: 139 mmol/L (ref 135–145)
Total Bilirubin: 0.6 mg/dL (ref 0.3–1.2)
Total Protein: 7.7 g/dL (ref 6.5–8.1)

## 2020-07-27 LAB — LIPASE, BLOOD: Lipase: 36 U/L (ref 11–51)

## 2020-07-27 MED ORDER — MORPHINE SULFATE (PF) 4 MG/ML IV SOLN
4.0000 mg | Freq: Once | INTRAVENOUS | Status: AC
Start: 2020-07-27 — End: 2020-07-27
  Administered 2020-07-27: 4 mg via INTRAVENOUS
  Filled 2020-07-27: qty 1

## 2020-07-27 MED ORDER — HYDROCODONE-ACETAMINOPHEN 5-325 MG PO TABS: 1.0000 | ORAL_TABLET | ORAL | 0 refills | Status: DC | PRN

## 2020-07-27 MED ORDER — ONDANSETRON HCL 4 MG/2ML IJ SOLN
4.0000 mg | Freq: Once | INTRAMUSCULAR | Status: AC
  Administered 2020-07-27: 4 mg via INTRAVENOUS
  Filled 2020-07-27: qty 2

## 2020-07-27 MED ORDER — MORPHINE SULFATE (PF) 4 MG/ML IV SOLN
4.0000 mg | Freq: Once | INTRAVENOUS | Status: AC
  Administered 2020-07-27: 4 mg via INTRAVENOUS
  Filled 2020-07-27: qty 1

## 2020-07-27 MED ORDER — ONDANSETRON HCL 4 MG PO TABS: 4.0000 mg | ORAL_TABLET | Freq: Four times a day (QID) | ORAL | 0 refills | Status: DC | PRN

## 2020-07-27 NOTE — ED Notes (Signed)
ED Provider at bedside. 

## 2020-07-27 NOTE — ED Notes (Signed)
Heat packs applied to patients back for pain per pt request.

## 2020-07-27 NOTE — Discharge Instructions (Addendum)
Return if pain is not being adequately controlled at home, you are vomiting in spite of the nausea medication, or if you start running a fever.  You have been given a narcotic pain medication.  Please pump your breastmilk and discard it for the next 24 hours.  This should also be done if you take any of the narcotic pain medication which has been prescribed for you.

## 2020-07-27 NOTE — ED Provider Notes (Signed)
[facility]-EMERGENCY DEPT Provider Note   CSN: [id number] Arrival date & time: 07/26/20  2334     History Chief Complaint  Patient presents with   Back Pain    Christine Schneider is a 30 y.o. female.  The history is provided by the patient.  Back Pain She has history of asthma, hyperlipidemia, SVT, is 6 weeks postpartum C-section and comes in with sudden onset of severe lumbar pain at 7:30 PM.  Pain then radiated up into the interscapular area and then around to the chest and abdomen.  Pain is rated at 10/10.  There was some nausea but no vomiting.  She denies any unusual activity.  She does have chronic back pain, but this is distinctly different.  She tried applying Bengay without any relief.  She also took ibuprofen without relief.  Of note, she had eaten some hot dogs and chips prior to onset of pain.  She is breast-feeding, but baby is also getting bottle-fed and in addition to breast milk.   Past Medical History:  Diagnosis Date   Abrasion of hand 09/09/2013   bilateral   Anxiety    Asthma    Complication of anesthesia    hair loss after anesthesia   Depression    Dysrhythmia    svt  treated   Elevated ALT measurement 02/05/2018   GERD (gastroesophageal reflux disease)    OTC as needed   H/O hiatal hernia    High cholesterol    no current med.   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    Patient Active Problem List   Diagnosis Date Noted   Mixed hyperlipidemia    Hypoxic episode 11/30/2018   Hearing reduced,  bilateral 11/30/2018   Chronic pain of right hip 06/15/2018   Elevated ALT measurement 02/05/2018   SVT (supraventricular tachycardia) (HCC) 02/01/2018   History of gestational diabetes 08/28/2017   Alopecia areata 08/28/2017   Hair thinning 08/28/2017   Fatigue 03/08/2017   Hot flashes 03/08/2017   Weight gain 03/08/2017   Polydipsia 03/08/2017   IUD (intrauterine device) in place 03/08/2017   Viral gastroenteritis 06/14/2016   ADD (attention deficit disorder) 05/27/2016   Anxiety and depression 05/27/2016   Bipolar depression (HCC) 05/27/2016   History of atrial flutter 05/27/2016    Past Surgical History:  Procedure Laterality Date   ABLATION     HIP SURGERY Right 07/07/2011   ULNAR NERVE TRANSPOSITION Right 09/13/2013   Procedure: RIGHT ELBOW ULNAR NERVE RELEASE ANTERIOR TRANSPOSITION WITH REPAIR AND RECONSTRUCTION ;  Surgeon: Dominica Severin, MD;  Location: [facility];  Service: Orthopedics;  Laterality: Right;     OB History     Gravida  1   Para      Term      Preterm      AB      Living         SAB      IAB      Ectopic  Multiple      Live Births              Family History  Problem Relation Age of Onset   Hyperlipidemia Mother    Hypertension Father    Depression Sister     Social History   Tobacco Use   Smoking status: Never   Smokeless tobacco: Never  Vaping Use   Vaping Use: Never used  Substance Use Topics   Alcohol use: No   Drug use: No    Home Medications Prior to Admission medications   Medication Sig Start Date End Date Taking? Authorizing Provider  albuterol (VENTOLIN HFA) 108 (90 Base) MCG/ACT inhaler Inhale 2 puffs into the lungs every 6 (six) hours as needed for wheezing or shortness of breath. 04/15/19   Chestine Sporelark, Virl AxeLaura P, DO  ALPRAZolam (XANAX) 0.25 MG tablet TAKE 1 TABLET(0.25 MG) BY MOUTH TWICE DAILY AS NEEDED FOR ANXIETY 04/06/18   Henson, Vickie L, NP-C  ARIPiprazole (ABILIFY) 2 MG tablet Take 2 mg  by mouth daily.    [provider]  Ascorbic Acid (VITAMIN C ER PO) Take by mouth.    [provider]  DULoxetine (CYMBALTA) 60 MG capsule Take 1 capsule (60 mg total) by mouth daily. Patient taking differently: Take 120 mg by mouth daily.  07/27/16   Henson, Vickie L, NP-C  flecainide (TAMBOCOR) 50 MG tablet Take 1 tablet by mouth 2 (two) times daily. 11/20/17   [provider]  fluticasone (FLOVENT HFA) 110 MCG/ACT inhaler Inhale 1 puff into the lungs in the morning and at bedtime. 04/15/19   Steffanie Dunnlark, Laura P, DO  GLUCOS-CHON-MSM-CA-C-CTCL-SECU PO Take by mouth.    [provider]  hydrOXYzine (VISTARIL) 25 MG capsule hydroxyzine pamoate 25 mg capsule    [provider]  levocetirizine (XYZAL) 5 MG tablet Take 5 mg by mouth every evening.    [provider]  metoprolol succinate (TOPROL-XL) 25 MG 24 hr tablet Take 25 mg by mouth daily.    [provider]  Multiple Vitamins-Minerals (MULTIVITAMIN WITH MINERALS) tablet Take 1 tablet by mouth daily.    [provider]  Omega-3 Fatty Acids (FISH OIL PO) Take by mouth.    [provider]  RESVERATROL PO Take by mouth.    [provider]  TRAZODONE HCL PO Take 50 mg by mouth at bedtime.    [provider]    Allergies    Percocet [oxycodone-acetaminophen] and Hydromorphone  Review of Systems   Review of Systems  Musculoskeletal:  Positive for back pain.  All other systems reviewed and are negative.  Physical Exam Updated Vital Signs BP 115/79 (BP Location: Right Arm)   Pulse 60   Temp 97.9 F (36.6 C) (Oral)   Resp 18   Ht 5\' 4"  (1.626 m)   Wt 96.6 kg   SpO2 100%   BMI 36.56 kg/m   Physical Exam Vitals and nursing note reviewed.  30 year old female, resting comfortably and in no acute distress. Vital signs are normal. Oxygen saturation is 100%, which is normal. Head is normocephalic and atraumatic. PERRLA, EOMI. Oropharynx is  clear. Neck is nontender and supple without adenopathy or JVD. Back is nontender and there is no CVA tenderness. Lungs are clear without rales, wheezes, or rhonchi. Chest is nontender. Heart has regular rate and rhythm without murmur. Abdomen is soft, flat, with marked right upper quadrant tenderness and a +/- Murphy sign.  There are no masses or hepatosplenomegaly and peristalsis  is hypoactive. Extremities have no cyanosis or edema, full range of motion is present. Skin is warm and dry without rash. Neurologic: Mental status is normal, cranial nerves are intact, there are no motor or sensory deficits.  ED Results / Procedures / Treatments   Labs (all labs ordered are listed, but only abnormal results are displayed) Labs Reviewed  COMPREHENSIVE METABOLIC PANEL - Abnormal; Notable for the following components:      Result Value   Glucose, Bld 106 (*)    BUN 23 (*)    AST 84 (*)    ALT 99 (*)    Alkaline Phosphatase 171 (*)    All other components within normal limits  CBC WITH DIFFERENTIAL/PLATELET - Abnormal; Notable for the following components:   WBC 11.9 (*)    Neutro Abs 9.1 (*)    All other components within normal limits  LIPASE, BLOOD   Radiology US Abdomen Limited RUQ (LIVER/GB)  Result Date: 07/27/2020 CLINICAL DATA:  Acute right upper quadrant pain EXAM: ULTRASOUND ABDOMEN LIMITED RIGHT UPPER QUADRANT COMPARISON:  02/13/2018 FINDINGS: Gallbladder: Small layering stones within the gallbladder measuring up to 9 mm. No wall thickening or sonographic Murphy sign. Common bile duct: Diameter: Normal caliber, 6 mm Liver: No focal lesion identified. Within normal limits in parenchymal echogenicity. Portal vein is patent on color Doppler imaging with normal direction of blood flow towards the liver. Other: None. IMPRESSION: Cholelithiasis.  No sonographic evidence of acute cholecystitis. Electronically Signed   By: Charlett Nose M.D.   On: 07/27/2020 02:03    Procedures Procedures    Medications Ordered in ED Medications  ondansetron (ZOFRAN) injection 4 mg (4 mg Intravenous Given 07/27/20 0151)  morphine 4 MG/ML injection 4 mg (4 mg Intravenous Given 07/27/20 0151)  morphine 4 MG/ML injection 4 mg (4 mg Intravenous Given 07/27/20 0232)    ED Course  I have reviewed the triage vital signs and the nursing notes.  Pertinent labs & imaging results that were available during my care of the patient were reviewed by me and considered in my medical decision making (see chart for details).   MDM Rules/Calculators/A&P                         Back pain which is radiated to involve pretty much the entire trunk.  Exam localizes tenderness to right upper quadrant, suspect cholecystitis.  Old records are reviewed confirming cesarean delivery on 06/15/2020.  Will check screening labs and get right upper quadrant ultrasound.  She will be given morphine for pain.  Patient advised that she will have to pump her breastmilk and discard for the next 24 hours following narcotic administration.  Ultrasound shows presence of cholelithiasis.  Labs do show mild elevation of transaminases and alkaline phosphatase.  She got good relief of pain with morphine, but did require 2 doses.  She is discharged with prescription for small number of hydrocodone-acetaminophen tablets as well as ondansetron and is referred to general surgery.  She is given strict return precautions.  Advised to stay on a low-fat diet.  Final Clinical Impression(s) / ED Diagnoses Final diagnoses:  RUQ pain  Calculus of gallbladder without cholecystitis without obstruction  Elevated liver function tests    Rx / DC Orders ED Discharge Orders          Ordered    HYDROcodone-acetaminophen (NORCO) 5-325 MG tablet  Every 4 hours PRN        07/27/20 0328  ondansetron (ZOFRAN) 4 MG tablet  Every 6 hours PRN        07/27/20 0328             Dione Booze, MD 07/27/20 [phone]

## 2020-08-06 DIAGNOSIS — K760 Fatty (change of) liver, not elsewhere classified: Secondary | ICD-10-CM | POA: Insufficient documentation

## 2020-08-25 ENCOUNTER — Encounter: Payer: Self-pay | Admitting: Family Medicine

## 2020-09-07 ENCOUNTER — Telehealth: Payer: Self-pay

## 2020-09-07 NOTE — Telephone Encounter (Signed)
Pt states she was just released from the hospital had gall bladder removed and then had issues with elevated lab results.  York Spaniel was told to follow up PCP within a week to discuss. I scheduled her with you on Monday.  She just got another abnormal lab result today for her IGG sub class 4 and is concerned about that and doesn't know what it is for and would like a call back regarding her labs

## 2020-09-07 NOTE — Telephone Encounter (Signed)
Advise pt that I cannot see this result. I have looked at all of the results available in CareEverywhere, but don't see that or know what it is in reference to.  She should bring all of her records to her visit next week

## 2020-09-08 NOTE — Telephone Encounter (Signed)
Pt informed

## 2020-09-13 NOTE — Progress Notes (Signed)
Chief Complaint  Patient presents with   other    Hospital f/u recheck LFT's urine is normal now     Patient presents for hospital follow-up.  She is a patient of Vickie Henson's.  She underwent laparascopic cholecystectomy on 08/31/20.  She was admitted to the hospital on 8/5, with complaints of abdominal pain, nausea, vomiting, lowgrade fever. She was also having dark urine and itching.  She was found to have elevated LFT's.  She had negative CT and MRCP.  Viral panel was negative (Hep A IgM negative, had positive IgG+M). She was discharged on 8/7.  LFT's trended down.  She is due for f/u LFT's today.  09/06/20 alk phos 282 (down from 392 on admittions), ALT 557 (down from 1128), AST 93 (down from 525).  Has f/u with surgeon on Thursday.  Patient has many questions--wondering if this episode of pain and elevated LFT's was related to her recent gall bladder surgery, vs the fact that she has had ALT elevated in the past, with US showing some fatty liver.  Had cholestasis with pregnancy. Wonders if that has anything to do with current course.  Other questions were related to test results-- ANA +, homogenous pattern 1:80 Anit-smooth muscle Ab 1:20 (normal <1:20) Copies were brought in--some of the results were NOT in CareEverywhere and will be scanned in.  Some pain starting in hips, backs, R>L knee, wrists/hands. States she had MRI of hips, OA (other joints not evaluated). Hands will swell sometimes.    PMH, PSH, SH reviewed FH updated--infant daughter with congenital hypothyroidism.  Recent visits reviewed: She was her cardiologist 8/12 for follow-up of her pSVT.  She is s/p ablation in 2017, and is currently on flecainide and beta blocker, still having some palpitations.  Plan is for 2 week Zio patch.   She is s/p c/s 05/2020, has IUD, had string check at OB's office last week  Asthma, seasonally, taking flovent, not needing albuterol  Outpatient Encounter Medications as of 09/14/2020   Medication Sig Note   Ascorbic Acid (VITAMIN C ER PO) Take by mouth.    DULoxetine (CYMBALTA) 60 MG capsule Take 1 capsule (60 mg total) by mouth daily. (Patient taking differently: Take 120 mg by mouth daily.)    flecainide (TAMBOCOR) 50 MG tablet Take 1 tablet by mouth 2 (two) times daily.    fluticasone (FLOVENT HFA) 110 MCG/ACT inhaler Inhale 1 puff into the lungs in the morning and at bedtime.    GLUCOS-CHON-MSM-CA-C-CTCL-SECU PO Take by mouth.    levocetirizine (XYZAL) 5 MG tablet Take 5 mg by mouth every evening.    metoprolol succinate (TOPROL-XL) 25 MG 24 hr tablet Take 25 mg by mouth daily.    Multiple Vitamins-Minerals (MULTIVITAMIN WITH MINERALS) tablet Take 1 tablet by mouth daily.    Omega-3 Fatty Acids (FISH OIL PO) Take by mouth.    RESVERATROL PO Take by mouth.    TRAZODONE HCL PO Take 50 mg by mouth at bedtime.    albuterol (VENTOLIN HFA) 108 (90 Base) MCG/ACT inhaler Inhale 2 puffs into the lungs every 6 (six) hours as needed for wheezing or shortness of breath. (Patient not taking: Reported on 09/14/2020)    ALPRAZolam (XANAX) 0.25 MG tablet TAKE 1 TABLET(0.25 MG) BY MOUTH TWICE DAILY AS NEEDED FOR ANXIETY (Patient not taking: Reported on 09/14/2020)    ARIPiprazole (ABILIFY) 2 MG tablet Take 2 mg by mouth daily. (Patient not taking: Reported on 09/14/2020)    HYDROcodone-acetaminophen (NORCO) 5-325 MG tablet Take 1  tablet by mouth every 4 (four) hours as needed for moderate pain. (Patient not taking: Reported on 09/14/2020) 09/14/2020: Not taking anymore   hydrOXYzine (VISTARIL) 25 MG capsule hydroxyzine pamoate 25 mg capsule (Patient not taking: Reported on 09/14/2020)    ondansetron (ZOFRAN) 4 MG tablet Take 1 tablet (4 mg total) by mouth every 6 (six) hours as needed for nausea. (Patient not taking: Reported on 09/14/2020)    No facility-administered encounter medications on file as of 09/14/2020.   Allergies  Allergen Reactions   Percocet [Oxycodone-Acetaminophen] Nausea And  Vomiting   Hydromorphone Itching and Other (See Comments)    Dilaudid    ROS: Denies fever, chills, URI symptoms, headaches, dizziness, shortness of breath, chest pain.  Denies urinary complaints (color is back to normal), bleeding, bruising, rash. Palpitations per baseline, unchanged. Abdominal pain--improved (normal post-op pain) Denies nausea, vomiting, having normal bowel movements. Itching of skin resolved See HPI    PHYSICAL EXAM:  BP 120/78   Pulse 70   Temp 98.1 F (36.7 C)   Wt 197 lb 9.6 oz (89.6 kg)   LMP 09/07/2020   BMI 33.92 kg/m   Well developed, pleasant female in no distress HEENT: conjunctiva and sclera are clear, EOMI, wearing mask Neck: no lymphadenopathy, thyromegaly or mass Heart: regular rate and rhythm Lungs: clear bilaterally Back: no spinal or CVA tenderness Abdomen: healing small laparoscopic surgical scars some bruising. No erythema, softi tissue swelling. Mild tenderness in epigastrium.  No hepatosplenomegaly, nontender in RUQ. Normal bowel sounds Extremities: no edema Skin: Scarring on arms bilaterally (from ICP with pregnancy, and also flared related to recent illness). Psych: normal mood, affect, hygiene and grooming  Copies of lab results reviewed with pt   ASSESSMENT/PLAN:  Elevated LFTs - s/p lap chole a few days prior, with neg MRCP, viral hepatitis studies. Recheck today. f/u as sched with surgeon - Plan: Hepatic function panel  S/P laparoscopic cholecystectomy - healing normally, f/u with surgeon later this week as planned  Reviewed results and her questions.  Suspect related to recent surgery, even with normal MRCP, and hope to see continued improvement in labs. If has persistent elevations, may need to further eval for autoimmune case. Surgeons can weigh in on the answers to her questions later this week.   I spent 34 minutes dedicated to the care of this patient, including pre-visit review of records, face to face time,  post-visit ordering of testing and documentation.

## 2020-09-14 ENCOUNTER — Ambulatory Visit (INDEPENDENT_AMBULATORY_CARE_PROVIDER_SITE_OTHER): Payer: 59 | Admitting: Family Medicine

## 2020-09-14 ENCOUNTER — Other Ambulatory Visit: Payer: Self-pay

## 2020-09-14 ENCOUNTER — Encounter: Payer: Self-pay | Admitting: Family Medicine

## 2020-09-14 VITALS — BP 120/78 | HR 70 | Temp 98.1°F | Wt 197.6 lb

## 2020-09-14 DIAGNOSIS — R7989 Other specified abnormal findings of blood chemistry: Secondary | ICD-10-CM

## 2020-09-14 DIAGNOSIS — Z9049 Acquired absence of other specified parts of digestive tract: Secondary | ICD-10-CM

## 2020-09-15 LAB — HEPATIC FUNCTION PANEL
ALT: 100 IU/L — ABNORMAL HIGH (ref 0–32)
AST: 32 IU/L (ref 0–40)
Albumin: 4.8 g/dL (ref 3.9–5.0)
Alkaline Phosphatase: 198 IU/L — ABNORMAL HIGH (ref 44–121)
Bilirubin Total: 0.4 mg/dL (ref 0.0–1.2)
Bilirubin, Direct: 0.14 mg/dL (ref 0.00–0.40)
Total Protein: 7.5 g/dL (ref 6.0–8.5)

## 2020-09-17 ENCOUNTER — Other Ambulatory Visit: Payer: Self-pay

## 2020-09-17 ENCOUNTER — Encounter: Payer: Self-pay | Admitting: Family Medicine

## 2020-09-17 DIAGNOSIS — R7989 Other specified abnormal findings of blood chemistry: Secondary | ICD-10-CM

## 2020-09-22 ENCOUNTER — Encounter: Payer: Self-pay | Admitting: Family Medicine

## 2020-10-07 ENCOUNTER — Ambulatory Visit (INDEPENDENT_AMBULATORY_CARE_PROVIDER_SITE_OTHER): Payer: 59 | Admitting: Gastroenterology

## 2020-10-07 ENCOUNTER — Encounter: Payer: Self-pay | Admitting: Gastroenterology

## 2020-10-07 VITALS — BP 120/70 | HR 91 | Ht 64.0 in | Wt 198.0 lb

## 2020-10-07 DIAGNOSIS — R748 Abnormal levels of other serum enzymes: Secondary | ICD-10-CM | POA: Diagnosis not present

## 2020-10-07 DIAGNOSIS — R768 Other specified abnormal immunological findings in serum: Secondary | ICD-10-CM

## 2020-10-07 NOTE — Patient Instructions (Addendum)
If you are age 30 or older, your body mass index should be between 23-30. Your Body mass index is 33.99 kg/m. If this is out of the aforementioned range listed, please consider follow up with your Primary Care Provider.  If you are age 60 or younger, your body mass index should be between 19-25. Your Body mass index is 33.99 kg/m. If this is out of the aformentioned range listed, please consider follow up with your Primary Care Provider.   Please come back in 3 months for repeat labs.,  The [facility] providers would like to encourage you to use [facility] to communicate with providers for non-urgent requests or questions.  Due to long hold times on the telephone, sending your provider a message by [facility] may be a faster and more efficient way to get a response.  Please allow 48 business hours for a response.  Please remember that this is for non-urgent requests.   It was a pleasure to see you today!  Thank you for trusting me with your gastrointestinal care!    Scott E. Tomasa Rand, MD

## 2020-10-07 NOTE — Progress Notes (Signed)
HPI : Christine Schneider is a very pleasant 30 year old female with a history of intrahepatic cholestasis of pregnancy who is referred to [place] by Mack Hook, NP for further evaluation of elevated liver enzymes.  The patient underwent a an elective laparoscopic cholecystectomy on August 1 for symptomatic cholelithiasis.  She presented back to the emergency room on August 3 with severe abdominal pain and elevated liver enzymes.  On August 3, her ALT was 145, AST 167, alk phos 138 normal lipase a CT scan done at that time showed expected postsurgical changes and a questionable retained stone just below the gallbladder fossa.  By August 5, her ALT had jumped up to 1128, AST 525, alk phos 392 and T bili 1.6.  An MRI did not reveal any evidence of a retained stone and no other abnormality to explain her elevated liver enzymes and abdominal pain.  On August 6, her ALT had dropped to 797, AST 225, alk phos 337, T bili 0.9.  On August 7, her ALT again dropped to 557, AST 93, alk phos 282.  A work-up for other causes of chronic liver disease was initiated and was largely negative except for a detectable smooth muscle antibody at 1:20, a detectable ANA at 1: 80 (homogenous pattern) and mildly elevated IgG4 level at 133.  The remainder of the work-up was negative.  Her liver enzymes continue to downtrend, but did not completely normalize.  On August 15, her ALT was 100 and her alk phos was 198. Prior to her cholecystectomy, she was noted to have elevated liver enzymes intermittently a hepatocellular pattern, ALT predominant.  In June, she did have an elevated alk phos as well to 171.  In January 2020, she had an ultrasound to evaluate the elevated liver enzymes and steatosis was noted.  The patient made changes to her diet and exercise, was able to lose some weight then.  Imaging of the liver in 2022 did not demonstrate the presence of steatosis.. The patient reports that her mother is thought to have a nonspecific  autoimmune disease and is followed by a rheumatologist.  Otherwise, she denies a family history of autoimmune diseases.  No family history of liver disease that she knows of.  The patient denies alcohol use. Currently, the patient reports resolution of the abdominal pain she had on the hospital.  She still has some residual abdominal tenderness around her surgical port scars, but otherwise feels well.    Past Medical History:  Diagnosis Date   Abrasion of hand 09/09/2013   bilateral   Anxiety    Asthma    Complication of anesthesia    hair loss after anesthesia   Depression    Dysrhythmia    svt  treated   Elevated ALT measurement 02/05/2018   GERD (gastroesophageal reflux disease)    OTC as needed   H/O hiatal hernia    High cholesterol    no current med.   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  Past Surgical History:  Procedure Laterality Date   ABLATION     HIP SURGERY Right 07/07/2011   ULNAR NERVE TRANSPOSITION Right 09/13/2013   Procedure: RIGHT ELBOW ULNAR NERVE RELEASE ANTERIOR TRANSPOSITION WITH REPAIR AND RECONSTRUCTION ;  Surgeon: Roseanne Kaufman, MD;  Location: [facility];  Service: Orthopedics;  Laterality: Right;   Family History  Problem Relation Age of Onset   Hyperlipidemia Mother    Hypertension Father    Depression Sister    Hypothyroidism Daughter        congenital   Esophageal cancer Other        mat great uncle, started in his mouth, smoker   Colon cancer Neg Hx    Social History   Tobacco Use    Smoking status: Never   Smokeless tobacco: Never  Vaping Use   Vaping Use: Never used  Substance Use Topics   Alcohol use: No   Drug use: No   Current Outpatient Medications  Medication Sig Dispense Refill   albuterol (VENTOLIN HFA) 108 (90 Base) MCG/ACT inhaler Inhale 2 puffs into the lungs every 6 (six) hours as needed for wheezing or shortness of breath. 18 g 11   DULoxetine (CYMBALTA) 60 MG capsule Take 1 capsule (60 mg total) by mouth daily. (Patient taking differently: Take 120 mg by mouth daily.) 60 capsule 0   flecainide (TAMBOCOR) 50 MG tablet Take 1 tablet by mouth 2 (two) times daily.     GLUCOS-CHON-MSM-CA-C-CTCL-SECU PO Take by mouth.     levocetirizine (XYZAL) 5 MG tablet Take 5 mg by mouth every evening.     metoprolol succinate (TOPROL-XL) 25 MG 24 hr tablet Take 25 mg by mouth daily.     Multiple Vitamins-Minerals (MULTIVITAMIN WITH MINERALS) tablet Take 1 tablet by mouth daily.     Omega-3 Fatty Acids (FISH OIL PO) Take by mouth.     RESVERATROL PO Take by mouth.     TRAZODONE HCL PO Take 50 mg by mouth at bedtime.     No current facility-administered medications for this visit.   Allergies  Allergen Reactions   Percocet [Oxycodone-Acetaminophen] Nausea And Vomiting   Hydromorphone Itching and Other (See Comments)    Dilaudid     Review of Systems: All systems reviewed and negative except where noted in HPI.    No results found.  Physical Exam: LMP 09/07/2020  Constitutional: Pleasant,well-developed, Caucasian female in no acute distress. HEENT: Normocephalic and atraumatic. Conjunctivae are normal. No scleral icterus. Cardiovascular: Normal rate, regular rhythm.  Pulmonary/chest: Effort normal and breath sounds normal. No wheezing, rales or rhonchi. Abdominal: Soft, nondistended, mild tenderness to palpation around laparoscopic port sites and epigastrium. Bowel sounds active throughout. There are no masses palpable. No hepatomegaly. Extremities: no  edema Neurological: Alert and oriented to person place and time. Skin: Skin is warm and dry.  Numerous papules and healed pustules in various stages present over the upper extremities Psychiatric: Normal mood and affect. Behavior is normal.  CBC    Component Value Date/Time   WBC 11.9 (H) 07/27/2020 0118   RBC 4.45 07/27/2020 0118   HGB 13.7 07/27/2020 0118   HGB 14.0 11/30/2018 1135   HCT 41.2 07/27/2020 0118   HCT 40.6 11/30/2018 1135   PLT 311 07/27/2020 0118   PLT 354 11/30/2018 1135   MCV 92.6 07/27/2020 0118   MCV 90 11/30/2018 1135   MCH 30.8 07/27/2020 0118   MCHC 33.3 07/27/2020 0118   RDW 12.2 07/27/2020 0118   RDW 12.4  11/30/2018 1135   LYMPHSABS 1.8 07/27/2020 0118   LYMPHSABS 1.8 11/30/2018 1135   MONOABS 0.7 07/27/2020 0118   EOSABS 0.1 07/27/2020 0118   EOSABS 0.2 11/30/2018 1135   BASOSABS 0.0 07/27/2020 0118   BASOSABS 0.0 11/30/2018 1135    CMP     Component Value Date/Time   NA 139 07/27/2020 0118   NA 142 11/30/2018 1135   K 4.0 07/27/2020 0118   CL 104 07/27/2020 0118   CO2 27 07/27/2020 0118   GLUCOSE 106 (H) 07/27/2020 0118   BUN 23 (H) 07/27/2020 0118   BUN 11 11/30/2018 1135   CREATININE 0.80 07/27/2020 0118   CREATININE 0.40 (L) 06/14/2016 1456   CALCIUM 9.0 07/27/2020 0118   PROT 7.5 09/14/2020 1155   ALBUMIN 4.8 09/14/2020 1155   AST 32 09/14/2020 1155   ALT 100 (H) 09/14/2020 1155   ALKPHOS 198 (H) 09/14/2020 1155   BILITOT 0.4 09/14/2020 1155   GFRNONAA >60 07/27/2020 0118   GFRAA >60 10/18/2019 1203   IgG4: 133 (range 2-96) Antimitochondrial antibody less than 20 Hepatitis B core antibody negative, surface antigen negative Hepatitis B surface antibody reactive Hepatitis C antibody negative Ceruloplasmin 35 SLA IgG antibody 0.7 Iron 67, iron saturation 16%, TIBC 412, transferrin 294 INR 0.87 Alpha-1 antitrypsin 154, phenotype MM IgG 1010 (range [phone]) ANA 1: 80, homogenous ASMA 1: 20   MRI Abdomen/MRCP 8/6:   Unremarkable biliary tree, appearance of liver s/p cholecystectomy CT 8/4:  Appropriate post surgical changes  ASSESSMENT AND PLAN: 30 year old female with history of mildly elevated liver enzymes in the years and months leading up to her cholecystectomy for symptomatic cholelithiasis, who experienced a significant bump in her liver enzymes with ALT reaching 1,000 with essentially normal bilirubin with rapid improvement, but still not normalized as of 2 weeks following her cholecystectomy.  She also had significant abdominal pain around this time, which is now resolved.  It is thought that her abdominal pain was most likely related to a retained stone which passed spontaneously.  This also likely contributed to her worsened aminotransferase elevation, although it is atypical for a retained gallstone to cause ALTs in the thousands with essentially normal bilirubin.  Other potential explanations would be drug-induced liver injury, hepatic hypoperfusion or vascular injury/thrombo-embolism, however none of these fit the patient's clinical scenario very well. Although the patient had mildly elevated titers of IgG4 and ANA and SMA, these low titers are not typical for autoimmune hepatitis.  Also, it would be unusual for autoimmune hepatitis to cause ALT levels in the thousands with spontaneous improvement the next day without any specific therapy. Overall, remains unclear exactly what caused the patient's spike in her liver enzymes.  I have low suspicion for autoimmune liver disease.  However, we will repeat her liver enzymes and titers in 3 months.  If her enzymes and titers are still elevated, then we can consider liver biopsy.  Elevated liver enzymes -Repeat liver enzymes,ASMA, AMA,  IgG4 level in 3 months -Consider liver biopsy if still elevated  Richy Spradley E. Candis Schatz, MD [facility]  Girtha Rm, NP-C

## 2020-10-09 ENCOUNTER — Encounter: Payer: Self-pay | Admitting: Gastroenterology

## 2020-11-06 ENCOUNTER — Other Ambulatory Visit (INDEPENDENT_AMBULATORY_CARE_PROVIDER_SITE_OTHER): Payer: 59

## 2020-11-06 DIAGNOSIS — R748 Abnormal levels of other serum enzymes: Secondary | ICD-10-CM | POA: Diagnosis not present

## 2020-11-06 DIAGNOSIS — R768 Other specified abnormal immunological findings in serum: Secondary | ICD-10-CM

## 2020-11-06 LAB — HEPATIC FUNCTION PANEL
ALT: 87 U/L — ABNORMAL HIGH (ref 0–35)
AST: 30 U/L (ref 0–37)
Albumin: 4.6 g/dL (ref 3.5–5.2)
Alkaline Phosphatase: 135 U/L — ABNORMAL HIGH (ref 39–117)
Bilirubin, Direct: 0.1 mg/dL (ref 0.0–0.3)
Total Bilirubin: 0.3 mg/dL (ref 0.2–1.2)
Total Protein: 7.7 g/dL (ref 6.0–8.3)

## 2020-11-11 LAB — ANA: Anti Nuclear Antibody (ANA): POSITIVE — AB

## 2020-11-11 LAB — ANTI-SMOOTH MUSCLE ANTIBODY, IGG: Actin (Smooth Muscle) Antibody (IGG): <20 U (ref <20)

## 2020-11-11 LAB — ANTI-NUCLEAR AB-TITER (ANA TITER): ANA Titer 1: 1:80 {titer} — ABNORMAL HIGH

## 2020-11-11 LAB — MITOCHONDRIAL ANTIBODIES: Mitochondrial M2 Ab, IgG: <=20 U (ref <=20.0)

## 2020-11-12 DIAGNOSIS — R748 Abnormal levels of other serum enzymes: Secondary | ICD-10-CM

## 2020-11-12 DIAGNOSIS — R768 Other specified abnormal immunological findings in serum: Secondary | ICD-10-CM

## 2020-11-13 ENCOUNTER — Other Ambulatory Visit: Payer: 59

## 2020-11-13 DIAGNOSIS — R768 Other specified abnormal immunological findings in serum: Secondary | ICD-10-CM

## 2020-11-13 DIAGNOSIS — R748 Abnormal levels of other serum enzymes: Secondary | ICD-10-CM

## 2020-11-19 LAB — IGG 4: IgG, Subclass 4: 133 mg/dL — ABNORMAL HIGH (ref 2–96)

## 2020-11-27 ENCOUNTER — Ambulatory Visit: Payer: 59 | Admitting: Gastroenterology

## 2020-12-09 ENCOUNTER — Ambulatory Visit (INDEPENDENT_AMBULATORY_CARE_PROVIDER_SITE_OTHER): Payer: 59 | Admitting: Gastroenterology

## 2020-12-09 ENCOUNTER — Other Ambulatory Visit (INDEPENDENT_AMBULATORY_CARE_PROVIDER_SITE_OTHER): Payer: 59

## 2020-12-09 ENCOUNTER — Encounter: Payer: Self-pay | Admitting: Gastroenterology

## 2020-12-09 VITALS — BP 110/72 | HR 88 | Ht 64.0 in | Wt 204.1 lb

## 2020-12-09 DIAGNOSIS — R748 Abnormal levels of other serum enzymes: Secondary | ICD-10-CM | POA: Diagnosis not present

## 2020-12-09 DIAGNOSIS — R768 Other specified abnormal immunological findings in serum: Secondary | ICD-10-CM

## 2020-12-09 LAB — PROTIME-INR
INR: 1 ratio (ref 0.8–1.0)
Prothrombin Time: 11 s (ref 9.6–13.1)

## 2020-12-09 LAB — CBC WITH DIFFERENTIAL/PLATELET
Basophils Absolute: 0 10*3/uL (ref 0.0–0.1)
Basophils Relative: 0.5 % (ref 0.0–3.0)
Eosinophils Absolute: 0.2 10*3/uL (ref 0.0–0.7)
Eosinophils Relative: 3.4 % (ref 0.0–5.0)
HCT: 43.6 % (ref 36.0–46.0)
Hemoglobin: 14.7 g/dL (ref 12.0–15.0)
Lymphocytes Relative: 35.2 % (ref 12.0–46.0)
Lymphs Abs: 1.9 10*3/uL (ref 0.7–4.0)
MCHC: 33.7 g/dL (ref 30.0–36.0)
MCV: 92.4 fl (ref 78.0–100.0)
Monocytes Absolute: 0.3 10*3/uL (ref 0.1–1.0)
Monocytes Relative: 6.4 % (ref 3.0–12.0)
Neutro Abs: 2.9 10*3/uL (ref 1.4–7.7)
Neutrophils Relative %: 54.5 % (ref 43.0–77.0)
Platelets: 264 10*3/uL (ref 150.0–400.0)
RBC: 4.71 Mil/uL (ref 3.87–5.11)
RDW: 12.3 % (ref 11.5–15.5)
WBC: 5.4 10*3/uL (ref 4.0–10.5)

## 2020-12-09 LAB — HEPATIC FUNCTION PANEL
ALT: 114 U/L — ABNORMAL HIGH (ref 0–35)
AST: 40 U/L — ABNORMAL HIGH (ref 0–37)
Albumin: 4.7 g/dL (ref 3.5–5.2)
Alkaline Phosphatase: 124 U/L — ABNORMAL HIGH (ref 39–117)
Bilirubin, Direct: 0.1 mg/dL (ref 0.0–0.3)
Total Bilirubin: 0.4 mg/dL (ref 0.2–1.2)
Total Protein: 7.8 g/dL (ref 6.0–8.3)

## 2020-12-09 NOTE — Progress Notes (Signed)
Linnae,  Your liver enzymes are still elevated, although not markedly so.  INR and CBC were normal.  Hopefully we'll get an answer soon after your liver biopsy

## 2020-12-09 NOTE — Patient Instructions (Signed)
If you are age 30 or older, your body mass index should be between 23-30. Your Body mass index is 35.04 kg/m. If this is out of the aforementioned range listed, please consider follow up with your Primary Care Provider.  If you are age 50 or younger, your body mass index should be between 19-25. Your Body mass index is 35.04 kg/m. If this is out of the aformentioned range listed, please consider follow up with your Primary Care Provider.   Your provider has requested that you go to the basement level for lab work before leaving today. Press "B" on the elevator. The lab is located at the first door on the left as you exit the elevator.  You will be contacted by [facility] in the next 2 days to arrange a US Liver Biopsy.  The number on your caller ID will be [phone], please answer when they call.  If you have not heard from them in 2 days please call [phone] to schedule.    The [facility] providers would like to encourage you to use [facility] to communicate with providers for non-urgent requests or questions.  Due to long hold times on the telephone, sending your provider a message by [facility] may be a faster and more efficient way to get a response.  Please allow 48 business hours for a response.  Please remember that this is for non-urgent requests.   It was a pleasure to see you today!  Thank you for trusting me with your gastrointestinal care!    Scott E.Tomasa Rand, MD

## 2020-12-09 NOTE — Progress Notes (Signed)
HPI : Christine Schneider is a very pleasant 30 year old female who I saw in early September of this year for persistent elevated liver enzymes following a cholecystectomy.  Please see HPI from that visit below.  At that time, the patient had mild elevations of her ANA antibodies, IgG4 levels, and a detectable ASMA (1: 20).  Given that these antibodies were not at high titers, it was unclear their significance.  We elected to repeat these levels in 3 months, and her IgG 4 levels and ANA titers were persistently elevated.  Her ASMA was undetectable.  Her liver enzymes have not normalized, but are much less than they were when she was hospitalized following her cholecystectomy. Today, the patient still reports some mild tenderness in her upper abdomen, but otherwise is doing okay.  She says she feels "off" and just not quite normal yet, but denies other more specific symptoms.  She does report some stiffness and discomfort in her hands and wrist bilaterally, without swelling or redness.  She reports very bothersome and persistent fatigue, despite sleeping very well.  When asked about other autoimmune history in her family, the patient states that her mother has hypothyroidism and "autoimmune markers" but does not have a formal diagnosis and the patient is not aware of which autoimmune markers were positive.  Her maternal great grandfather had rheumatoid arthritis.  Otherwise no known family history of autoimmune disorders.  HPI from October 07, 2020 Christine Schneider is a very pleasant 30 year old female with a history of intrahepatic cholestasis of pregnancy who is referred to [place] by Mack Hook, NP for further evaluation of elevated liver enzymes.  The patient underwent a an elective laparoscopic cholecystectomy on August 1 for symptomatic cholelithiasis.  She presented back to the emergency room on August 3 with severe abdominal pain and elevated liver enzymes.  On August 3, her ALT was 145, AST 167, alk phos  138 normal lipase a CT scan done at that time showed expected postsurgical changes and a questionable retained stone just below the gallbladder fossa.  By August 5, her ALT had jumped up to 1128, AST 525, alk phos 392 and T bili 1.6.  An MRI did not reveal any evidence of a retained stone and no other abnormality to explain her elevated liver enzymes and abdominal pain.  On August 6, her ALT had dropped to 797, AST 225, alk phos 337, T bili 0.9.  On August 7, her ALT again dropped to 557, AST 93, alk phos 282.  A work-up for other causes of chronic liver disease was initiated and was largely negative except for a detectable smooth muscle antibody at 1:20, a detectable ANA at 1: 80 (homogenous pattern) and mildly elevated IgG4 level at 133.  The remainder of the work-up was negative.  Her liver enzymes continue to downtrend, but did not completely normalize.  On August 15, her ALT was 100 and her alk phos was 198. Prior to her cholecystectomy, she was noted to have elevated liver enzymes intermittently a hepatocellular pattern, ALT predominant.  In June, she did have an elevated alk phos as well to 171.  In January 2020, she had an ultrasound to evaluate the elevated liver enzymes and steatosis was noted.  The patient made changes to her diet and exercise, was able to lose some weight then.  Imaging of the liver in 2022 did not demonstrate the presence of steatosis.. The patient reports that her mother is thought to have a nonspecific autoimmune disease and is followed  by a rheumatologist.  Otherwise, she denies a family history of autoimmune diseases.  No family history of liver disease that she knows of.  The patient denies alcohol use. Currently, the patient reports resolution of the abdominal pain she had on the hospital.  She still has some residual abdominal tenderness around her surgical port scars, but otherwise feels well.   Past Medical History:  Diagnosis Date   Abrasion of hand 09/09/2013    bilateral   Anxiety    Asthma    Complication of anesthesia    hair loss after anesthesia   Depression    Dysrhythmia    svt  treated   Elevated ALT measurement 02/05/2018   Esophagitis    GERD (gastroesophageal reflux disease)    OTC as needed   Gestational diabetes    H/O hiatal hernia    High cholesterol    no current med.   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     Past Surgical History:  Procedure Laterality Date   ABLATION     HIP SURGERY Right 07/07/2011   INTRAUTERINE DEVICE (IUD) INSERTION     ULNAR NERVE TRANSPOSITION Right 09/13/2013   Procedure: RIGHT ELBOW ULNAR NERVE RELEASE ANTERIOR TRANSPOSITION WITH REPAIR AND RECONSTRUCTION ;  Surgeon: Roseanne Kaufman, MD;  Location: [facility];  Service: Orthopedics;  Laterality: Right;   Family History  Problem Relation Age of Onset   Hyperlipidemia Mother    Hypertension Father    Depression Sister    Hypothyroidism Daughter        congenital   Esophageal cancer Other        mat great uncle, started in his mouth, smoker   Colon cancer Neg Hx    Social History   Tobacco Use   Smoking status: Never   Smokeless tobacco: Never  Vaping Use   Vaping Use: Never used  Substance Use Topics   Alcohol use: No   Drug use: No   Current Outpatient Medications  Medication Sig Dispense Refill   albuterol (VENTOLIN HFA) 108 (90 Base) MCG/ACT inhaler Inhale 2 puffs into the lungs every 6 (six) hours as needed for wheezing or shortness of breath. 18 g 11    DULoxetine (CYMBALTA) 60 MG capsule Take 1 capsule (60 mg total) by mouth daily. (Patient taking differently: Take 120 mg by mouth 2 (two) times daily.) 60 capsule 0   flecainide (TAMBOCOR) 50 MG tablet Take 1 tablet by mouth 2 (two) times daily.     levocetirizine (XYZAL) 5 MG tablet Take 5 mg by mouth every evening.     levonorgestrel (LILETTA, 52 MG,) 20.1 MCG/DAY IUD 1 each by Intrauterine route once.     metoprolol succinate (TOPROL-XL) 25 MG 24 hr tablet Take 25 mg by mouth daily.     Multiple Vitamins-Minerals (MULTIVITAMIN WITH MINERALS) tablet Take 1 tablet by mouth daily.     Omega-3 Fatty Acids (FISH OIL PO) Take by mouth.  RESVERATROL PO Take by mouth.     TRAZODONE HCL PO Take 75 mg by mouth at bedtime.     No current facility-administered medications for this visit.   Allergies  Allergen Reactions   Percocet [Oxycodone-Acetaminophen] Nausea And Vomiting   Hydromorphone Itching and Other (See Comments)    Dilaudid     Review of Systems: All systems reviewed and negative except where noted in HPI.    No results found.  Physical Exam: BP 110/72   Pulse 88   Ht _0  (1.626 m)   Wt 204 lb 2 oz (92.6 kg)   BMI 35.04 kg/m  Constitutional: Pleasant,well-developed, Caucasian female in no acute distress. HEENT: Normocephalic and atraumatic. Conjunctivae are normal. No scleral icterus. Cardiovascular: Normal rate, regular rhythm.  Pulmonary/chest: Effort normal and breath sounds normal. No wheezing, rales or rhonchi. Abdominal: Soft, nondistended, nontender. Bowel sounds active throughout. There are no masses palpable. No hepatomegaly. Extremities: no edema, no swelling or redness of the hands or wrists noted Neurological: Alert and oriented to person place and time. Skin: Skin is warm and dry. No rashes noted. Psychiatric: Normal mood and affect. Behavior is normal.  CBC    Component Value Date/Time   WBC 11.9 (H) 07/27/2020 0118   RBC 4.45 07/27/2020 0118    HGB 13.7 07/27/2020 0118   HGB 14.0 11/30/2018 1135   HCT 41.2 07/27/2020 0118   HCT 40.6 11/30/2018 1135   PLT 311 07/27/2020 0118   PLT 354 11/30/2018 1135   MCV 92.6 07/27/2020 0118   MCV 90 11/30/2018 1135   MCH 30.8 07/27/2020 0118   MCHC 33.3 07/27/2020 0118   RDW 12.2 07/27/2020 0118   RDW 12.4 11/30/2018 1135   LYMPHSABS 1.8 07/27/2020 0118   LYMPHSABS 1.8 11/30/2018 1135   MONOABS 0.7 07/27/2020 0118   EOSABS 0.1 07/27/2020 0118   EOSABS 0.2 11/30/2018 1135   BASOSABS 0.0 07/27/2020 0118   BASOSABS 0.0 11/30/2018 1135    CMP     Component Value Date/Time   NA 139 07/27/2020 0118   NA 142 11/30/2018 1135   K 4.0 07/27/2020 0118   CL 104 07/27/2020 0118   CO2 27 07/27/2020 0118   GLUCOSE 106 (H) 07/27/2020 0118   BUN 23 (H) 07/27/2020 0118   BUN 11 11/30/2018 1135   CREATININE 0.80 07/27/2020 0118   CREATININE 0.40 (L) 06/14/2016 1456   CALCIUM 9.0 07/27/2020 0118   PROT 7.7 11/06/2020 1602   PROT 7.5 09/14/2020 1155   ALBUMIN 4.6 11/06/2020 1602   ALBUMIN 4.8 09/14/2020 1155   AST 30 11/06/2020 1602   ALT 87 (H) 11/06/2020 1602   ALKPHOS 135 (H) 11/06/2020 1602   BILITOT 0.3 11/06/2020 1602   BILITOT 0.4 09/14/2020 1155   GFRNONAA >60 07/27/2020 0118   GFRAA >60 10/18/2019 1203     ASSESSMENT AND PLAN: 30 year old female with chronically elevated liver enzymes, mixed pattern, ALT predominant, normal T bili with transient marked elevation of her liver enzymes following a cholecystectomy this summer with negative work-up thus far except for mildly elevated IgG4 levels and weakly positive antinuclear antibody.  MRCP was unremarkable.  Given the persistent liver enzyme elevation, and the questionable autoimmune markers, I think that a liver biopsy is indicated to further evaluate for evidence of autoimmune hepatitis/cholangitis.  The patient was in agreement with this plan.  Will refer to IR for percutaneous core biopsy.  Elevated liver enzymes, positive  autoimmune antibodies - Liver biopsy - Repeat liver enzymes today to  ensure no significant change in the past month - CBC and INR prior to liver biopsy  Christeen Lai E. Candis Schatz, MD [facility]   Henson, Laurian Brim, PA-C

## 2020-12-13 ENCOUNTER — Other Ambulatory Visit: Payer: Self-pay | Admitting: Physician Assistant

## 2020-12-14 ENCOUNTER — Ambulatory Visit (HOSPITAL_COMMUNITY)
Admission: RE | Admit: 2020-12-14 | Discharge: 2020-12-14 | Disposition: A | Discharge status: Home / Self Care | Payer: 59 | Source: Ambulatory Visit | Attending: Gastroenterology | Admitting: Gastroenterology

## 2020-12-14 ENCOUNTER — Other Ambulatory Visit: Payer: Self-pay

## 2020-12-14 ENCOUNTER — Encounter (HOSPITAL_COMMUNITY): Payer: Self-pay

## 2020-12-14 DIAGNOSIS — I471 Supraventricular tachycardia: Secondary | ICD-10-CM | POA: Diagnosis not present

## 2020-12-14 DIAGNOSIS — R768 Other specified abnormal immunological findings in serum: Secondary | ICD-10-CM | POA: Insufficient documentation

## 2020-12-14 DIAGNOSIS — Z9049 Acquired absence of other specified parts of digestive tract: Secondary | ICD-10-CM | POA: Diagnosis not present

## 2020-12-14 DIAGNOSIS — E782 Mixed hyperlipidemia: Secondary | ICD-10-CM | POA: Diagnosis not present

## 2020-12-14 DIAGNOSIS — R748 Abnormal levels of other serum enzymes: Secondary | ICD-10-CM | POA: Insufficient documentation

## 2020-12-14 LAB — CBC
HCT: 43.9 % (ref 36.0–46.0)
Hemoglobin: 14.7 g/dL (ref 12.0–15.0)
MCH: 31.1 pg (ref 26.0–34.0)
MCHC: 33.5 g/dL (ref 30.0–36.0)
MCV: 93 fL (ref 80.0–100.0)
Platelets: 301 10*3/uL (ref 150–400)
RBC: 4.72 MIL/uL (ref 3.87–5.11)
RDW: 11.7 % (ref 11.5–15.5)
WBC: 6.7 10*3/uL (ref 4.0–10.5)
nRBC: 0 % (ref 0.0–0.2)

## 2020-12-14 LAB — PROTIME-INR
INR: 0.9 (ref 0.8–1.2)
Prothrombin Time: 12.2 seconds (ref 11.4–15.2)

## 2020-12-14 MED ORDER — MIDAZOLAM HCL 2 MG/2ML IJ SOLN
INTRAMUSCULAR | Status: AC | PRN
  Administered 2020-12-14: 1 mg via INTRAVENOUS

## 2020-12-14 MED ORDER — FENTANYL CITRATE (PF) 100 MCG/2ML IJ SOLN
INTRAMUSCULAR | Status: AC | PRN
Start: 2020-12-14 — End: 2020-12-14
  Administered 2020-12-14: 50 ug via INTRAVENOUS

## 2020-12-14 MED ORDER — LIDOCAINE HCL 1 % IJ SOLN
INTRAMUSCULAR | Status: AC
  Filled 2020-12-14: qty 20

## 2020-12-14 MED ORDER — FENTANYL CITRATE (PF) 100 MCG/2ML IJ SOLN
INTRAMUSCULAR | Status: AC | PRN
  Administered 2020-12-14: 50 ug via INTRAVENOUS

## 2020-12-14 MED ORDER — GELATIN ABSORBABLE 12-7 MM EX MISC
CUTANEOUS | Status: AC
  Filled 2020-12-14: qty 1

## 2020-12-14 MED ORDER — SODIUM CHLORIDE 0.9 % IV SOLN: INTRAVENOUS | Status: DC

## 2020-12-14 MED ORDER — MIDAZOLAM HCL 2 MG/2ML IJ SOLN
INTRAMUSCULAR | Status: AC
  Filled 2020-12-14: qty 2

## 2020-12-14 MED ORDER — LIDOCAINE HCL (PF) 1 % IJ SOLN
INTRAMUSCULAR | Status: AC | PRN
  Administered 2020-12-14: 10 mL via INTRADERMAL

## 2020-12-14 MED ORDER — FENTANYL CITRATE (PF) 100 MCG/2ML IJ SOLN
INTRAMUSCULAR | Status: AC
  Filled 2020-12-14: qty 2

## 2020-12-14 NOTE — H&P (Signed)
Chief Complaint: Elevated liver enzymes and positive ANA. Request is for liver biopsy.  Referring Physician(s): Merton E  Supervising Physician: Aletta Edouard  Patient Status: [facility] - Out-pt  History of Present Illness: Christine Schneider is a 30 y.o. female outpatient. History of  HLD, SVT, chronic mixed pattern elevated liver enzymes and intra-hepatic cholelithiasis of pregnancy s/p lap cholecystectomy on 8.1.22. Found to have positive ANA post cholecystectomy. Team is concerned for autoimmune hepatitis/cholangitis. Patient presents for liver biopsy for further evaluation.  Currently without any significant complaints. Patient alert and laying in bed, calm and comfortable. Denies any fevers, headache, chest pain, SOB, cough, abdominal pain, nausea, vomiting or bleeding. Return precautions and treatment recommendations and follow-up discussed with the patient who is agreeable with the plan.   Past Medical History:  Diagnosis Date   Abrasion of hand 09/09/2013   bilateral   Anxiety    Asthma    Complication of anesthesia    hair loss after anesthesia   Depression    Dysrhythmia    svt  treated   Elevated ALT measurement 02/05/2018   Esophagitis    GERD (gastroesophageal reflux disease)    OTC as needed   Gestational diabetes    H/O hiatal hernia    High cholesterol    no current med.   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 Authorizing Provider  albuterol (VENTOLIN HFA) 108 (90 Base) MCG/ACT inhaler Inhale 2 puffs into the lungs every 6 (six) hours as needed for wheezing or shortness of breath. 04/15/19   Julian Hy, DO  DULoxetine (CYMBALTA) 60 MG capsule Take 1 capsule (60 mg total) by mouth daily. Patient taking differently: Take 120 mg by mouth 2 (two) times daily. 07/27/16   Henson, Vickie L, PA-C  flecainide (TAMBOCOR) 50 MG tablet Take 1 tablet by mouth 2 (two) times daily. 11/20/17   [provider]  levocetirizine (XYZAL) 5 MG tablet Take 5 mg by mouth every evening.    [provider]  levonorgestrel (LILETTA, 52 MG,) 20.1 MCG/DAY IUD 1 each by Intrauterine route once.    [provider]  metoprolol succinate (TOPROL-XL) 25 MG 24 hr tablet Take 25 mg by mouth daily.    [provider]  Multiple Vitamins-Minerals (MULTIVITAMIN WITH MINERALS) tablet Take 1 tablet by mouth daily.    [provider]  Omega-3 Fatty Acids (FISH OIL PO) Take by mouth.    [provider]  RESVERATROL PO Take by mouth.    [provider]  TRAZODONE HCL PO Take 75 mg by mouth at bedtime.    [provider]     Family History  Problem Relation Age of Onset   Hyperlipidemia Mother    Hypertension Father    Depression Sister    Hypothyroidism Daughter        congenital   Esophageal cancer Other        mat great uncle, started in his mouth, smoker   Colon cancer Neg Hx     Social History    Socioeconomic History   Marital status: Married    Spouse name: Not on file   Number of children: 2   Years of education: Not on file   Highest education level: Not on file  Occupational History   Occupation: homemaker  Tobacco Use   Smoking status: Never   Smokeless tobacco: Never  Vaping Use   Vaping Use: Never used  Substance and Sexual Activity   Alcohol use: No   Drug use: No   Sexual activity: Yes    Birth control/protection: I.U.D.  Other Topics Concern   Not on file  Social History Narrative   Not on file   Social Determinants of Health   Financial Resource Strain: Not on file  Food Insecurity: Not on file  Transportation Needs: Not on file  Physical Activity: Not on file  Stress: Not on file  Social Connections: Not on file    Review of Systems: A 12 point ROS discussed and pertinent positives are indicated in the HPI above.  All other systems are negative.  Review of Systems  Constitutional:  Negative for fatigue and fever.  HENT:  Negative for congestion.   Respiratory:  Negative for cough and shortness of breath.   Gastrointestinal:  Negative for abdominal pain, diarrhea, nausea and vomiting.   Vital Signs: There were no vitals taken for this visit.  Physical Exam Vitals and nursing note reviewed.  Constitutional:      Appearance: She is well-developed.  HENT:     Head: Normocephalic and atraumatic.  Eyes:     Conjunctiva/sclera: Conjunctivae normal.  Cardiovascular:     Rate and Rhythm: Normal rate and regular rhythm.     Heart sounds: Normal heart sounds.  Pulmonary:     Effort: Pulmonary effort is normal.     Breath sounds: Normal breath sounds.  Musculoskeletal:     Cervical back: Normal range of motion.  Neurological:     Mental Status: She is alert and oriented to person, place, and time.    Imaging: No results found.  Labs:  CBC: Recent Labs    07/27/20 0118 12/09/20 1132  WBC 11.9* 5.4  HGB 13.7 14.7  HCT 41.2 43.6   PLT 311 264.0    COAGS: Recent Labs    12/09/20 1132  INR 1.0    BMP: Recent Labs    07/27/20 0118  NA 139  K 4.0  CL 104  CO2 27  GLUCOSE 106*  BUN 23*  CALCIUM 9.0  CREATININE 0.80  GFRNONAA >60    LIVER FUNCTION TESTS: Recent Labs    07/27/20 0118 09/14/20 1155 11/06/20 1602 12/09/20 1132  BILITOT 0.6 0.4 0.3 0.4  AST 84* 32 30 40*  ALT 99* 100* 87* 114*  ALKPHOS 171* 198* 135* 124*  PROT 7.7 7.5 7.7 7.8  ALBUMIN 4.5 4.8 4.6 4.7      Assessment and Plan:  30 y.o. female outpatient. History of HLD, SVT, chronic mixed pattern elevated liver enzymes and intra-hepatic cholelithiasis of pregnancy s/p lap cholecystectomy on 8.1.22. Found to have positive ANA post cholecystectomy. Team is concerned for autoimmune hepatitis/cholangitis. Patient presents for liver biopsy for further evaluation.   Labs from 11.9 shows AST 40, ALT 114. Alkaline phosphatase 124. All medications are within acceptable parameters. Allergies include hydromorphone and percocet. Patient has been NPO since midnight.   Risks and benefits of random renal biopsy was discussed with the patient and/or patient's family including, but not limited to bleeding, infection, damage to adjacent structures or low yield requiring additional tests.  All of the questions were answered and there is agreement to proceed.  Consent signed and in chart.   Thank you for this interesting consult.  I greatly enjoyed meeting Christine Schneider and look forward to participating in their care.  A copy of this report was sent to the requesting provider on this date.  Electronically Signed: Alene Mires, NP 12/14/2020, 12:05 PM   I spent a total of  30 Minutes   in face to face in clinical consultation, greater than 50% of which was counseling/coordinating care for random renal biopsy

## 2020-12-14 NOTE — Discharge Instructions (Signed)
Interventional radiology phone numbers [phone] After hours [phone]  Liver Biopsy, Care After These instructions give you information on caring for yourself after your procedure. Your doctor may also give you more specific instructions. Call your doctor if you have any problems or questions after your procedure. What can I expect after the procedure? After the procedure, it is common to have: Pain and soreness where the biopsy was done. Bruising around the area where the biopsy was done. Sleepiness and be tired for a few days. Follow these instructions at home: Medicines Take over-the-counter and prescription medicines only as told by your doctor. If you were prescribed an antibiotic medicine, take it as told by your doctor. Do not stop taking the antibiotic even if you start to feel better. Do not take medicines such as aspirin and ibuprofen. These medicines can thin your blood. Do not take these medicines unless your doctor tells you to take them. If you are taking prescription pain medicine, take actions to prevent or treat constipation. Your doctor may recommend that you: Drink enough fluid to keep your pee (urine) clear or pale yellow. Take over-the-counter or prescription medicines. Eat foods that are high in fiber, such as fresh fruits and vegetables, whole grains, and beans. Limit foods that are high in fat and processed sugars, such as fried and sweet foods. Caring for your cut Follow instructions from your doctor about how to take care of your cuts from surgery (incisions). Make sure you: Wash your hands with soap and water before you change your bandage (dressing). If you cannot use soap and water, use hand sanitizer. Change your bandage as told by your doctor. Check your cuts every day for signs of infection. Check for: Redness, swelling, or more pain. Fluid or blood. Pus or a bad smell. Warmth. Do not take baths, swim, or use a hot tub until your doctor says it is  okay to do so. You may remove your dressing tomorrow and shower. Activity Rest at home for 1-2 days or as told by your doctor. Avoid sitting for a long time without moving. Get up to take short walks every 1-2 hours. Return to your normal activities as told by your doctor. Ask what activities are safe for you. Do not do these things in the first 24 hours: Drive. Use machinery. Take a bath or shower. Do not lift more than 10 pounds (4.5 kg) or play contact sports for the first 2 weeks.   General instructions Do not drink alcohol in the first week after the procedure. Have someone stay with you for at least 24 hours after the procedure. Get your test results. Ask your doctor or the department that is doing the test: When will my results be ready? How will I get my results? What are my treatment options? What other tests do I need? What are my next steps? Keep all follow-up visits as told by your doctor. This is important.   Contact a doctor if: A cut bleeds and leaves more than just a small spot of blood. A cut is red, puffs up (swells), or hurts more than before. Fluid or something else comes from a cut. A cut smells bad. You have a fever or chills. Get help right away if: You have swelling, bloating, or pain in your belly (abdomen). You get dizzy or faint. You have a rash. You feel sick to your stomach (nauseous) or throw up (vomit). You have trouble breathing, feel short of breath, or feel faint. Your chest   hurts. You have problems talking or seeing. You have trouble with your balance or moving your arms or legs. Summary After the procedure, it is common to have pain, soreness, bruising, and tiredness. Your doctor will tell you how to take care of yourself at home. Change your bandage, take your medicines, and limit your activities as told by your doctor. Call your doctor if you have symptoms of infection. Get help right away if your belly swells, your cut bleeds a lot, or you  have trouble talking or breathing. This information is not intended to replace advice given to you by your health care provider. Make sure you discuss any questions you have with your health care provider. Document Revised: 01/26/2017 Document Reviewed: 01/27/2017 Elsevier Patient Education  2021 Elsevier Inc.     Moderate Conscious Sedation, Adult, Care After This sheet gives you information about how to care for yourself after your procedure. Your health care provider may also give you more specific instructions. If you have problems or questions, contact your health care provider. What can I expect after the procedure? After the procedure, it is common to have: Sleepiness for several hours. Impaired judgment for several hours. Difficulty with balance. Vomiting if you eat too soon. Follow these instructions at home: For the time period you were told by your health care provider: Rest. Do not participate in activities where you could fall or become injured. Do not drive or use machinery. Do not drink alcohol. Do not take sleeping pills or medicines that cause drowsiness. Do not make important decisions or sign legal documents. Do not take care of children on your own.     Eating and drinking Follow the diet recommended by your health care provider. Drink enough fluid to keep your urine pale yellow. If you vomit: Drink water, juice, or soup when you can drink without vomiting. Make sure you have little or no nausea before eating solid foods.   General instructions Take over-the-counter and prescription medicines only as told by your health care provider. Have a responsible adult stay with you for the time you are told. It is important to have someone help care for you until you are awake and alert. Do not smoke. Keep all follow-up visits as told by your health care provider. This is important. Contact a health care provider if: You are still sleepy or having trouble with  balance after 24 hours. You feel light-headed. You keep feeling nauseous or you keep vomiting. You develop a rash. You have a fever. You have redness or swelling around the IV site. Get help right away if: You have trouble breathing. You have new-onset confusion at home. Summary After the procedure, it is common to feel sleepy, have impaired judgment, or feel nauseous if you eat too soon. Rest after you get home. Know the things you should not do after the procedure. Follow the diet recommended by your health care provider and drink enough fluid to keep your urine pale yellow. Get help right away if you have trouble breathing or new-onset confusion at home. This information is not intended to replace advice given to you by your health care provider. Make sure you discuss any questions you have with your health care provider. Document Revised: 05/17/2019 Document Reviewed: 12/13/2018 Elsevier Patient Education  2021 Elsevier Inc.  

## 2020-12-14 NOTE — Procedures (Signed)
Interventional Radiology Procedure Note  Procedure: US Guided Biopsy of Liver  Complications: None  Estimated Blood Loss: < 10 mL  Findings: 18 G core biopsy of kucwe performed under US guidance.  Three core samples obtained and sent to Pathology.  Jodi Marble. Fredia Sorrow, M.D Pager:  [phone]

## 2020-12-16 LAB — SURGICAL PATHOLOGY

## 2020-12-21 ENCOUNTER — Other Ambulatory Visit: Payer: Self-pay

## 2020-12-21 ENCOUNTER — Telehealth: Payer: Self-pay | Admitting: Gastroenterology

## 2020-12-21 DIAGNOSIS — R748 Abnormal levels of other serum enzymes: Secondary | ICD-10-CM

## 2020-12-21 NOTE — Telephone Encounter (Signed)
Beatriz Chancellor, NP, called about this patient.  She is concerned about her liver function and wanted to try to wean her off (temporarily) the Flecanide to see if that helped with her levels. She needed advice on how to taper and how long she should be off of it before she could expect to see any results.  Her phone numbers are:  Desk phone - [phone] Office phone - [phone]  Thank you.

## 2020-12-21 NOTE — Progress Notes (Signed)
Christine Schneider,  The liver biopsies did not show any evidence of autoimmune hepatitis/cholangitis.  There was no scarring/fibrosis (which would be seen in longstanding liver disease).  The abnormalities on the biopsy were minimal and reassuring.  I wonder if maybe there was a drug you received while you were in the hospital that caused a significant reaction, and it is just taking a long time for the liver enzymes to normalize.  I don't think that the mildly elevated autoantibodies are significant now.  I think it would be reasonable to periodically monitor your liver enzymes, but I don't think any further work up or testing is needed at this time.  I would like to repeat your liver enzymes in 6 months.  Please contact us if you have further questions/concerns.

## 2021-01-05 ENCOUNTER — Telehealth: Payer: Self-pay | Admitting: Gastroenterology

## 2021-01-05 ENCOUNTER — Encounter: Payer: Self-pay | Admitting: Gastroenterology

## 2021-01-05 NOTE — Telephone Encounter (Signed)
Hi Dr. Tomasa Rand,  Sharman Crate, Psychiatric NP for patient, would like you to call her and consult about a couple of medications this patient is taking that could have possible side effects to the liver -- Cymbalta and Trazodone.  Please call her cell phone at [phone].  Thank you.

## 2021-03-10 ENCOUNTER — Encounter: Payer: Self-pay | Admitting: Family Medicine

## 2021-03-10 ENCOUNTER — Other Ambulatory Visit: Payer: Self-pay | Admitting: *Deleted

## 2021-03-10 ENCOUNTER — Other Ambulatory Visit (INDEPENDENT_AMBULATORY_CARE_PROVIDER_SITE_OTHER): Payer: 59

## 2021-03-10 ENCOUNTER — Telehealth (INDEPENDENT_AMBULATORY_CARE_PROVIDER_SITE_OTHER): Payer: 59 | Admitting: Family Medicine

## 2021-03-10 ENCOUNTER — Other Ambulatory Visit: Payer: Self-pay

## 2021-03-10 VITALS — BP 119/86 | HR 81 | Temp 99.3°F | Ht 64.0 in | Wt 203.0 lb

## 2021-03-10 DIAGNOSIS — R509 Fever, unspecified: Secondary | ICD-10-CM

## 2021-03-10 DIAGNOSIS — R52 Pain, unspecified: Secondary | ICD-10-CM | POA: Diagnosis not present

## 2021-03-10 DIAGNOSIS — R051 Acute cough: Secondary | ICD-10-CM

## 2021-03-10 DIAGNOSIS — J029 Acute pharyngitis, unspecified: Secondary | ICD-10-CM

## 2021-03-10 DIAGNOSIS — R059 Cough, unspecified: Secondary | ICD-10-CM

## 2021-03-10 DIAGNOSIS — B349 Viral infection, unspecified: Secondary | ICD-10-CM | POA: Diagnosis not present

## 2021-03-10 LAB — POCT INFLUENZA A/B
Influenza A, POC: NEGATIVE
Influenza B, POC: NEGATIVE

## 2021-03-10 LAB — POC COVID19 BINAXNOW: SARS Coronavirus 2 Ag: NEGATIVE

## 2021-03-10 LAB — POCT RAPID STREP A (OFFICE): Rapid Strep A Screen: NEGATIVE

## 2021-03-10 MED ORDER — BENZONATATE 200 MG PO CAPS: 200.0000 mg | ORAL_CAPSULE | Freq: Three times a day (TID) | ORAL | 0 refills | Status: DC | PRN

## 2021-03-10 NOTE — Patient Instructions (Signed)
Stay well hydrated. Continue your over-the-counter medications that you have been taking. We are refilling the tessalon cough medicatoin. I recommend that you take Mucinex 12 hour (plain) twice daily Cont albuterol if needed for any wheezing or shortness of breath.  If your PCR test for COVID is positive tomorrow, we will be sending in prescription for molnupiravir, and you will need to isolate until 2/17 (for days 1-10, with the first day being 2/7). Stay isolating at home until you get this result. If it is negative, it is likely another viral syndome/illness. Continue with the above mentioned supportive measures. Contact us if your symptoms are worsening (persistent high fever, worsening cough, shortness of breath, pain with breathing, chest pain, etc.)

## 2021-03-10 NOTE — Progress Notes (Signed)
Start time: 2:17 End time: 2:38  Virtual Visit via Video Note  I connected with Christine Schneider on 03/10/21 by a video enabled telemedicine application and verified that I am speaking with the correct person using two identifiers.  Location: Patient: home Provider: office   I discussed the limitations of evaluation and management by telemedicine and the availability of in person appointments. The patient expressed understanding and agreed to proceed.  History of Present Illness:  Chief Complaint  Patient presents with   Cough    VIRTUAL Cough, ST, low grade fever, HA and body aches. Has been exposed to strep and flu A in the last 10 days. Symptoms started Monday. Home covid test today was negative.    She was around her boss's daughter last weekend, who had strep throat. 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  Her symptoms started 2 days ago 2/6, with cough.  Inhaler hasn't helped with cough (she thought that was the type of cough it was, but didn't help). Started with headache yesterday, sore throat yesterday morning.  Cough is nonproductive.  Denies any significant nasal congestion.  Nasal drainage is clear. She has had low grade fever, starting yesterday, T100. +myalgias since yesterday.  She has been taking Tessalon, nyquil every 6 hours (doesn't make her sleepy), zinc  Not breastfeeding.  PMH, PSH, SH reviewed  Outpatient Encounter Medications as of 03/10/2021  Medication Sig Note   albuterol (VENTOLIN HFA) 108 (90 Base) MCG/ACT inhaler Inhale 2 puffs into the lungs every 6 (six) hours as needed for wheezing or shortness of breath. 03/10/2021: Last used yesterday   benzonatate (TESSALON) 200 MG capsule Take by mouth. 03/10/2021: Last dose 8:30 am   Doxylamine-DM (VICKS DAYQUIL/NYQUIL COUGH PO) Take 30 mLs by mouth as needed. 03/10/2021: Last dose 8:30am   DULoxetine (CYMBALTA) 30 MG capsule  Take 30 mg by mouth daily.    DULoxetine (CYMBALTA) 60 MG capsule Take 60 mg by mouth daily.    gabapentin (NEURONTIN) 300 MG capsule Take 300 mg by mouth daily.    levocetirizine (XYZAL) 5 MG tablet Take 5 mg by mouth every evening.    metoprolol succinate (TOPROL-XL) 25 MG 24 hr tablet Take 75 mg by mouth daily. 03/10/2021: 50mg  qam and 25 qhs   Multiple Vitamins-Minerals (MULTIVITAMIN WITH MINERALS) tablet Take 1 tablet by mouth daily.    Multiple Vitamins-Minerals (ZINC PO) Take 11 mg by mouth every 4 (four) hours. 03/10/2021: chewable   norethindrone (MICRONOR) 0.35 MG tablet Take 1 tablet by mouth daily.    Omega-3 Fatty Acids (FISH OIL PO) Take by mouth.    RESVERATROL PO Take by mouth.    TRAZODONE HCL PO Take 50 mg by mouth at bedtime.    [DISCONTINUED] flecainide (TAMBOCOR) 50 MG tablet Take 1 tablet by mouth 2 (two) times daily.    [DISCONTINUED] DULoxetine (CYMBALTA) 60 MG capsule Take 1 capsule (60 mg total) by mouth daily. (Patient taking differently: Take 120 mg by mouth 2 (two) times daily.)    [DISCONTINUED] levonorgestrel (LILETTA, 52 MG,) 20.1 MCG/DAY IUD 1 each by Intrauterine route once.    No facility-administered encounter medications on file as of 03/10/2021.   ROS:  No chest pain or shortness of breath. Some wheezy sounds yesterday. No n/v/d.  HA, sore throat, cough, LG fever, myalgia per HPI.    Observations/Objective:  BP 119/86    Pulse 81  Temp 99.3 F (37.4 C) (Tympanic)    Ht 5\' 4"  (1.626 m)    Wt 203 lb (92.1 kg)    BMI 34.84 kg/m   Dry cough, some throat-clearing, only a few times during visit. She is alert and oriented, speaking easily and comfortably, in no distress 31 year old and infant accompanying her on the couch.  Negative rapid strep Negative influenza A&B Negative rapid COVID  Assessment and Plan:  Acute viral syndrome - test for flu and COVID, and strep (due to exposure, doubt). Pt not vaccinated for COVID, if PCR + needs antiviral (and 10d  isolation).  Acute cough - Plan: benzonatate (TESSALON) 200 MG capsule  Supportive measures reviewed. Will refill Tessalon.  Tamiflu if flu If COVID--unvaccinated, Molnupiravir due to drug interactions Days 1-10 (2/7-17) [facility] on [address].   Stay well hydrated. Continue your over-the-counter medications that you have been taking. We are refilling the tessalon cough medicatoin. I recommend that you take Mucinex 12 hour (plain) twice daily Cont albuterol if needed for any wheezing or shortness of breath.      Follow Up Instructions:    I discussed the assessment and treatment plan with the patient. The patient was provided an opportunity to ask questions and all were answered. The patient agreed with the plan and demonstrated an understanding of the instructions.   The patient was advised to call back or seek an in-person evaluation if the symptoms worsen or if the condition fails to improve as anticipated.  I spent 23 minutes dedicated to the care of this patient, including pre-visit review of records, face to face time, post-visit ordering of testing and documentation.    [place], MD

## 2021-03-11 ENCOUNTER — Encounter: Payer: Self-pay | Admitting: Family Medicine

## 2021-03-11 LAB — NOVEL CORONAVIRUS, NAA: SARS-CoV-2, NAA: NOT DETECTED

## 2021-05-03 ENCOUNTER — Ambulatory Visit (INDEPENDENT_AMBULATORY_CARE_PROVIDER_SITE_OTHER): Payer: 59 | Admitting: Physician Assistant

## 2021-05-03 ENCOUNTER — Encounter: Payer: Self-pay | Admitting: Physician Assistant

## 2021-05-03 VITALS — BP 110/70 | HR 79 | Ht 64.0 in | Wt 197.0 lb

## 2021-05-03 DIAGNOSIS — E782 Mixed hyperlipidemia: Secondary | ICD-10-CM | POA: Diagnosis not present

## 2021-05-03 DIAGNOSIS — M25552 Pain in left hip: Secondary | ICD-10-CM

## 2021-05-03 DIAGNOSIS — F5101 Primary insomnia: Secondary | ICD-10-CM

## 2021-05-03 DIAGNOSIS — R7401 Elevation of levels of liver transaminase levels: Secondary | ICD-10-CM

## 2021-05-03 DIAGNOSIS — R0683 Snoring: Secondary | ICD-10-CM | POA: Diagnosis not present

## 2021-05-03 DIAGNOSIS — M25551 Pain in right hip: Secondary | ICD-10-CM | POA: Diagnosis not present

## 2021-05-03 DIAGNOSIS — M25561 Pain in right knee: Secondary | ICD-10-CM | POA: Diagnosis not present

## 2021-05-03 DIAGNOSIS — Z0000 Encounter for general adult medical examination without abnormal findings: Secondary | ICD-10-CM

## 2021-05-03 DIAGNOSIS — G8929 Other chronic pain: Secondary | ICD-10-CM

## 2021-05-03 DIAGNOSIS — Z6833 Body mass index (BMI) 33.0-33.9, adult: Secondary | ICD-10-CM

## 2021-05-03 NOTE — Progress Notes (Addendum)
Complete physical exam   Patient: Christine Schneider   DOB: Aug 28, 1990   31 y.o. Female  MRN: [id number] Visit Date: 05/03/2021  Chief Complaint  Patient presents with   Annual Exam    Fasting CPE- no other concerns   Subjective    Christine Schneider is a 31 y.o. female who presents today for a complete physical exam.   Reports is generally feeling fairly well; is eating a healthy diet; is sleeping well fairly well as long as she takes her trazodone; also reports snoring and some disruptive breathing while she sleeps; also reports she's been having right knee pain intermittently x 11 years, worse for 2 months, no knee injuries or falls; denies history of previous knee surgery; also states that her resltess leg syndrome is worse some nights and the gabapentin is somewhat helpful; Also reports that she's had Raynaud's in her feet for years and now gets it in her hands   HPI HPI     Annual Exam    Additional comments: Fasting CPE- no other concerns      Last edited by Sammuel Cooper, CMA on 05/03/2021  3:29 PM.        Past Medical History:  Diagnosis Date   Abrasion of hand 09/09/2013   bilateral   Anxiety    Asthma    Complication of anesthesia    hair loss after anesthesia   Depression    Dysrhythmia    svt  treated   Elevated ALT measurement 02/05/2018   Esophagitis    GERD (gastroesophageal reflux disease)    OTC as needed   Gestational diabetes    H/O hiatal hernia    High cholesterol    no current med.   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   Past Surgical History:  Procedure Laterality Date   ABLATION     HIP SURGERY Right 07/07/2011   INTRAUTERINE DEVICE (IUD) INSERTION     ULNAR NERVE TRANSPOSITION Right 09/13/2013   Procedure: RIGHT ELBOW ULNAR NERVE RELEASE ANTERIOR TRANSPOSITION WITH REPAIR AND RECONSTRUCTION ;  Surgeon: Dominica Severin, MD;  Location: [facility];  Service: Orthopedics;  Laterality: Right;   Social History   Socioeconomic History   Marital status: Married    Spouse name: Not on file   Number of children: 2   Years of education: Not on file   Highest education level: Not on file  Occupational History   Occupation: homemaker  Tobacco Use   Smoking status: Never   Smokeless tobacco: Never  Vaping Use   Vaping Use: Never used  Substance and Sexual Activity   Alcohol use: No   Drug use: No   Sexual activity: Yes    Birth control/protection: I.U.D.  Other Topics Concern   Not on file  Social History Narrative   Not on file   Social Determinants of Health   Financial Resource Strain: Not on file  Food Insecurity: Not on file  Transportation Needs: Not on file  Physical Activity: Not on file  Stress: Not on file  Social Connections: Not on file  Intimate Partner Violence: Not on file   Family Status  Relation Name Status   Mother  Alive   Father  Alive   Sister  Alive   Daughter  Alive   Son  Alive   Oceanographer  (Not Specified)   MGF  (Not Specified)   PGM  Deceased   PGF  Deceased   Other  Deceased   Neg Hx  (Not Specified)   Family History  Problem Relation Age of Onset   Hyperlipidemia Mother    Hypertension Father    Depression Sister    Hypothyroidism Daughter        congenital   Breast cancer Paternal Aunt    Lymphoma Maternal Grandfather        B cell   Heart disease Maternal Grandfather    Heart attack Paternal Grandmother    Diabetes type I Paternal Grandfather    Esophageal cancer Other        mat great uncle,  started in his mouth, smoker   Colon cancer Neg Hx    CVA Neg Hx    Allergies  Allergen Reactions   Percocet [Oxycodone-Acetaminophen] Nausea And Vomiting   Hydromorphone Itching and Other (See Comments)    Dilaudid    Patient Care Team: Lexine Baton as PCP - General (Physician Assistant)   Medications: Outpatient Medications Prior to Visit  Medication Sig   albuterol (VENTOLIN HFA) 108 (90 Base) MCG/ACT inhaler Inhale 2 puffs into the lungs every 6 (six) hours as needed for wheezing or shortness of breath.   amphetamine-dextroamphetamine (ADDERALL XR) 20 MG 24 hr capsule Take 20 mg by mouth daily.   DULoxetine (CYMBALTA) 30 MG capsule Take 30 mg by mouth daily.   DULoxetine (CYMBALTA) 60 MG capsule Take 60 mg by mouth daily.   gabapentin (NEURONTIN) 300 MG capsule Take 300 mg by mouth daily.   levocetirizine (XYZAL) 5 MG tablet Take 5 mg by mouth every evening.   metoprolol succinate (TOPROL-XL) 25 MG 24 hr tablet Take 75 mg by mouth daily.   Multiple Vitamins-Minerals (MULTIVITAMIN WITH MINERALS) tablet Take 1 tablet by mouth daily.   Multiple Vitamins-Minerals (ZINC PO) Take 11 mg by mouth every 4 (four) hours.   norethindrone (MICRONOR) 0.35 MG tablet Take 1 tablet by mouth daily.   Omega-3 Fatty Acids (FISH OIL PO) Take by mouth.   RESVERATROL PO Take by mouth.   TRAZODONE HCL PO Take 50 mg by mouth at bedtime.   levocetirizine (XYZAL) 5 MG tablet Take 1 tablet by mouth daily.   metoprolol tartrate (LOPRESSOR) 25 MG tablet Take by mouth.   [DISCONTINUED] benzonatate (TESSALON) 200 MG capsule Take 1 capsule (200 mg total) by mouth 3 (three) times daily as needed for cough.   [DISCONTINUED] Doxylamine-DM (VICKS DAYQUIL/NYQUIL COUGH PO) Take 30 mLs by mouth as needed.   No facility-administered medications prior to visit.    Review of Systems  Constitutional:  Negative for activity change and fever.  HENT:  Negative for congestion, ear pain and voice change.    Eyes:  Negative for redness.  Respiratory:  Negative for cough.   Cardiovascular:  Negative for chest pain.  Gastrointestinal:  Negative for constipation and diarrhea.  Endocrine: Negative for polyuria.  Genitourinary:  Negative for flank pain.  Musculoskeletal:  Positive for arthralgias and myalgias. Negative for gait problem and  neck stiffness.  Skin:  Negative for color change and rash.  Neurological:  Negative for dizziness.  Hematological:  Negative for adenopathy.  Psychiatric/Behavioral:  Negative for agitation, behavioral problems and confusion.    Last CBC Lab Results  Component Value Date   WBC 7.3 05/03/2021   HGB 14.8 05/03/2021   HCT 44.3 05/03/2021   MCV 91 05/03/2021   MCH 30.3 05/03/2021   RDW 11.8 05/03/2021   PLT 348 05/03/2021   Last metabolic panel Lab Results  Component Value Date   GLUCOSE 105 (H) 05/03/2021   NA 140 05/03/2021   K 4.3 05/03/2021   CL 103 05/03/2021   CO2 23 05/03/2021   BUN 13 05/03/2021   CREATININE 0.71 05/03/2021   GFRNONAA >60 07/27/2020   CALCIUM 8.8 05/03/2021   PROT 6.9 05/03/2021   ALBUMIN 4.7 05/03/2021   LABGLOB 2.2 05/03/2021   AGRATIO 2.1 05/03/2021   BILITOT <0.2 05/03/2021   ALKPHOS 100 05/03/2021   AST 29 05/03/2021   ALT 87 (H) 05/03/2021   ANIONGAP 8 07/27/2020   Last lipids Lab Results  Component Value Date   CHOL 152 05/03/2021   HDL 22 (L) 05/03/2021   LDLCALC 100 (H) 05/03/2021   TRIG 171 (H) 05/03/2021   CHOLHDL 6.9 (H) 05/03/2021   Last hemoglobin A1c Lab Results  Component Value Date   HGBA1C 5.5 09/26/2019   Last thyroid functions Lab Results  Component Value Date   TSH 0.946 05/03/2021      The ASCVD Risk score (Arnett DK, et al., 2019) failed to calculate for the following reasons:   The 2019 ASCVD risk score is only valid for ages 24 to 64   Objective    BP 110/70   Pulse 79   Wt 197 lb (89.4 kg)   LMP 04/05/2021   SpO2 97%   BMI 33.81 kg/m       Physical  Exam Vitals and nursing note reviewed.  Constitutional:      General: She is not in acute distress.    Appearance: Normal appearance. She is not ill-appearing.  HENT:     Head: Normocephalic and atraumatic.     Right Ear: Tympanic membrane, ear canal and external ear normal.     Left Ear: Tympanic membrane, ear canal and external ear normal.     Nose: No congestion.  Eyes:     Extraocular Movements: Extraocular movements intact.     Conjunctiva/sclera: Conjunctivae normal.     Pupils: Pupils are equal, round, and reactive to light.  Neck:     Vascular: No carotid bruit.  Cardiovascular:     Rate and Rhythm: Normal rate and regular rhythm.     Pulses: Normal pulses.     Heart sounds: Normal heart sounds.  Pulmonary:     Effort: Pulmonary effort is normal.     Breath sounds: Normal breath sounds. No wheezing.  Abdominal:     General: Bowel sounds are normal.     Palpations: Abdomen is soft.  Musculoskeletal:        General: Normal range of motion.     Cervical back: Normal range of motion and neck supple.     Right lower leg: No edema.     Left lower leg: No edema.  Skin:    General: Skin is warm and dry.     Findings: No bruising.  Neurological:     General: No focal deficit present.     Mental Status: She is alert and  oriented to person, place, and time.  Psychiatric:        Mood and Affect: Mood normal.        Behavior: Behavior normal.        Thought Content: Thought content normal.      Last depression screening scores    05/03/2021    3:33 PM 11/30/2018   11:02 AM  PHQ 2/9 Scores  PHQ - 2 Score 0 0   Last fall risk screening    05/03/2021    3:32 PM  Fall Risk   Falls in the past year? 0  Number falls in past yr: 0  Injury with Fall? 0  Risk for fall due to : No Fall Risks  Follow up Falls evaluation completed     Results for orders placed or performed in visit on 05/03/21  CBC with Differential/Platelet  Result Value Ref Range   WBC 7.3 3.4 - 10.8  x10E3/uL   RBC 4.88 3.77 - 5.28 x10E6/uL   Hemoglobin 14.8 11.1 - 15.9 g/dL   Hematocrit 40.9 81.1 - 46.6 %   MCV 91 79 - 97 fL   MCH 30.3 26.6 - 33.0 pg   MCHC 33.4 31.5 - 35.7 g/dL   RDW 91.4 78.2 - 95.6 %   Platelets 348 150 - 450 x10E3/uL   Neutrophils 65 Not Estab. %   Lymphs 25 Not Estab. %   Monocytes 6 Not Estab. %   Eos 3 Not Estab. %   Basos 1 Not Estab. %   Neutrophils Absolute 4.8 1.4 - 7.0 x10E3/uL   Lymphocytes Absolute 1.8 0.7 - 3.1 x10E3/uL   Monocytes Absolute 0.5 0.1 - 0.9 x10E3/uL   EOS (ABSOLUTE) 0.2 0.0 - 0.4 x10E3/uL   Basophils Absolute 0.0 0.0 - 0.2 x10E3/uL   Immature Granulocytes 0 Not Estab. %   Immature Grans (Abs) 0.0 0.0 - 0.1 x10E3/uL  Comprehensive metabolic panel  Result Value Ref Range   Glucose 105 (H) 70 - 99 mg/dL   BUN 13 6 - 20 mg/dL   Creatinine, Ser 2.13 0.57 - 1.00 mg/dL   eGFR 086 >57 QI/ONG/2.95   BUN/Creatinine Ratio 18 9 - 23   Sodium 140 134 - 144 mmol/L   Potassium 4.3 3.5 - 5.2 mmol/L   Chloride 103 96 - 106 mmol/L   CO2 23 20 - 29 mmol/L   Calcium 8.8 8.7 - 10.2 mg/dL   Total Protein 6.9 6.0 - 8.5 g/dL   Albumin 4.7 3.8 - 4.8 g/dL   Globulin, Total 2.2 1.5 - 4.5 g/dL   Albumin/Globulin Ratio 2.1 1.2 - 2.2   Bilirubin Total <0.2 0.0 - 1.2 mg/dL   Alkaline Phosphatase 100 44 - 121 IU/L   AST 29 0 - 40 IU/L   ALT 87 (H) 0 - 32 IU/L  TSH + free T4  Result Value Ref Range   TSH 0.946 0.450 - 4.500 uIU/mL   Free T4 1.11 0.82 - 1.77 ng/dL  Lipid panel  Result Value Ref Range   Cholesterol, Total 152 100 - 199 mg/dL   Triglycerides 284 (H) 0 - 149 mg/dL   HDL 22 (L) >13 mg/dL   VLDL Cholesterol Cal 30 5 - 40 mg/dL   LDL Chol Calc (NIH) 244 (H) 0 - 99 mg/dL   Chol/HDL Ratio 6.9 (H) 0.0 - 4.4 ratio    Assessment & Plan    Routine Health Maintenance and Physical Exam  Exercise Activities and Dietary recommendations  Goals   None  Immunization History  Administered Date(s) Administered   Influenza,inj,Quad  PF,6+ Mos 04/18/2017, 11/30/2018   Influenza,inj,quad, With Preservative 02/03/2016   Influenza-Unspecified 03/23/2018   MMR 09/03/2016, 09/03/2016   Tdap 11/08/2014, 11/08/2014, 06/21/2016, 06/21/2016, 04/24/2020    Health Maintenance  Topic Date Due   PAP SMEAR-Modifier  04/04/2021   INFLUENZA VACCINE  08/31/2021   TETANUS/TDAP  04/25/2030   Hepatitis C Screening  Completed   HIV Screening  Completed   HPV VACCINES  Aged Out   COVID-19 Vaccine  Discontinued    Discussed health benefits of physical activity, and encouraged her to engage in regular exercise appropriate for her age and condition.  Problem List Items Addressed This Visit       Other   Elevated ALT measurement    CMP drawn today If you take OTC Tylenol and drink alcohol, decrease the amounts of both Drink 8 - 10 glasses a day or about 64 ounces a day You can add the OTC supplement milk thistle which supports the liver      Relevant Orders   Comprehensive metabolic panel (Completed)   Mixed hyperlipidemia    diet controlled, liid panel drawn today, eat a low fat diet, increase fiber intake (Benefiber or Metamucil, Cherrios,  oatmeal, beans, nuts, fruits and vegetables), limit saturated fats (in fried foods, red meat), can add OTC fish oil supplement, eat fish with Omega-3 fatty acids like salmon and tuna, exercise for 30 minutes 3 - 5 times a week, drink 8 - 10 glasses of water a day        Relevant Medications   metoprolol tartrate (LOPRESSOR) 25 MG tablet   Other Relevant Orders   Lipid panel (Completed)   Chronic hip pain, bilateral   Relevant Orders   Ambulatory referral to Orthopedic Surgery   CBC with Differential/Platelet (Completed)   Chronic pain of right knee   Relevant Orders   Ambulatory referral to Orthopedic Surgery   CBC with Differential/Platelet (Completed)   Snoring   Relevant Orders   Ambulatory referral to Sleep Studies   Comprehensive metabolic panel (Completed)   TSH + free T4  (Completed)   Body mass index (BMI) of 33.0 to 33.9 in adult   Other Visit Diagnoses     Routine medical exam    -  Primary   Relevant Orders   CBC with Differential/Platelet (Completed)   Comprehensive metabolic panel (Completed)   TSH + free T4 (Completed)   Lipid panel (Completed)        Return in about 1 year (around 05/04/2022) for Return for Annual Exam.     Jake Shark, PA-C

## 2021-05-03 NOTE — Patient Instructions (Signed)
Preventative Care for Adults - Female ?   ?  MAINTAIN REGULAR HEALTH EXAMS: ?A routine yearly physical is a good way to check in with your primary care provider about your health and preventive screening. It is also an opportunity to share updates about your health and any concerns you have, and receive a thorough all-over exam.  ?Most health insurance companies pay for at least some preventative services.  Check with your health plan for specific coverages. ? ?WHAT PREVENTATIVE SERVICES DO WOMEN NEED? ?Adult women should have their weight and blood pressure checked regularly.  ?Women age 31 and older should have their cholesterol levels checked regularly. ?Women should be screened for cervical cancer with a Pap smear and pelvic exam beginning at either age 31, or 3 years after they become sexually activity.   ?Breast cancer screening generally begins at age 31 with a mammogram and breast exam by your primary care provider.   ?Beginning at age 31 and continuing to age 75, women should be screened for colorectal cancer.  Certain people may need continued testing until age 85. ?Updating vaccinations is part of preventative care.  Vaccinations help protect against diseases such as the flu. ?Osteoporosis is a disease in which the bones lose minerals and strength as we age. Women ages 65 and over should discuss this with their caregivers, as should women after menopause who have other risk factors. ?Lab tests are generally done as part of preventative care to screen for anemia and blood disorders, to screen for problems with the kidneys and liver, to screen for bladder problems, to check blood sugar, and to check your cholesterol level. ?Preventative services generally include counseling about diet, exercise, avoiding tobacco, drugs, excessive alcohol consumption, and sexually transmitted infections.   ? ?GENERAL RECOMMENDATIONS FOR GOOD HEALTH: ? ?Healthy diet: ?Eat a variety of foods, including fruit, vegetables,  animal or vegetable protein, such as meat, fish, chicken, and eggs, or beans, lentils, tofu, and grains, such as rice. ?Drink plenty of water daily (60 - 80 ounces or 8 - 10 glasses a day) ?Decrease saturated fat in the diet, avoid lots of red meat, processed foods, sweets, fast foods, and fried foods. For high cholesterol - Increase fiber intake (Benefiber or Metamucil, Cherrios,  oatmeal, beans, nuts, fruits and vegetables), limit saturated fats (in fried foods, red meat), can add OTC fish oil supplement, eat fish with Omega-3 fatty acids like salmon and tuna, exercise for 30 minutes 3 - 5 times a week, drink 8 - 10 glasses of water a day ? ?Exercise: ?Aerobic exercise helps maintain good heart health. At least 30-40 minutes of moderate-intensity exercise is recommended. For example, a brisk walk that increases your heart rate and breathing. This should be done on most days of the week.  ?Find a type of exercise or a variety of exercises that you enjoy so that it becomes a part of your daily life.  Examples are running, walking, swimming, water aerobics, and biking.  For motivation and support, explore group exercise such as aerobic class, spin class, Zumba, Yoga,or  martial arts, etc.   ?Set exercise goals for yourself, such as a certain weight goal, walk or run in a race such as a 5k walk/run.  Speak to your primary care provider about exercise goals. ? ?Disease prevention: ?If you smoke or chew tobacco, find out from your caregiver how to quit. It can literally save your life, no matter how long you have been a tobacco user. If you do not   use tobacco, never begin.  ?Maintain a healthy diet and normal weight. Increased weight leads to problems with blood pressure and diabetes.  ?The Body Mass Index or BMI is a way of measuring how much of your body is fat. Having a BMI above 27 increases the risk of heart disease, diabetes, hypertension, stroke and other problems related to obesity. Your caregiver can help  determine your BMI and based on it develop an exercise and dietary program to help you achieve or maintain this important measurement at a healthful level. ?High blood pressure causes heart and blood vessel problems.  Persistent high blood pressure should be treated with medicine if weight loss and exercise do not work.  ?Fat and cholesterol leaves deposits in your arteries that can block them. This causes heart disease and vessel disease elsewhere in your body.  If your cholesterol is found to be high, or if you have heart disease or certain other medical conditions, then you may need to have your cholesterol monitored frequently and be treated with medication.  ?Ask if you should have a cardiac stress test if your history suggests this. A stress test is a test done on a treadmill that looks for heart disease. This test can find disease prior to there being a problem. ?Menopause can be associated with physical symptoms and risks. Hormone replacement therapy is available to decrease these. You should talk to your caregiver about whether starting or continuing to take hormones is right for you.  ?Osteoporosis is a disease in which the bones lose minerals and strength as we age. This can result in serious bone fractures. Risk of osteoporosis can be identified using a bone density scan. Women ages 65 and over should discuss this with their caregivers, as should women after menopause who have other risk factors. Ask your caregiver whether you should be taking a calcium supplement and Vitamin D, to reduce the rate of osteoporosis.  ?Avoid drinking alcohol in excess (more than two drinks per day).  Avoid use of street drugs. Do not share needles with anyone. Ask for professional help if you need assistance or instructions on stopping the use of alcohol, cigarettes, and/or drugs. ?Brush your teeth twice a day with fluoride toothpaste, and floss once a day. Good oral hygiene prevents tooth decay and gum disease. The  problems can be painful, unattractive, and can cause other health problems. Visit your dentist for a routine oral and dental check up and preventive care every 6-12 months.  ?Look at your skin regularly.  Use a mirror to look at your back. Notify your caregivers of changes in moles, especially if there are changes in shapes, colors, a size larger than a pencil eraser, an irregular border, or development of new moles. ? ?Safety: ?Use seatbelts 100% of the time, whether driving or as a passenger.  Use safety devices such as hearing protection if you work in environments with loud noise or significant background noise.  Use safety glasses when doing any work that could send debris in to the eyes.  Use a helmet if you ride a bike or motorcycle.  Use appropriate safety gear for contact sports.  Talk to your caregiver about gun safety. ?Use sunscreen with a SPF (or skin protection factor) of 15 or greater.  Lighter skinned people are at a greater risk of skin cancer. Don?t forget to also wear sunglasses in order to protect your eyes from too much damaging sunlight. Damaging sunlight can accelerate cataract formation.  ?Practice safe sex. Use   condoms. Condoms are used for birth control and to help reduce the spread of sexually transmitted infections (or STIs).  Some of the STIs are gonorrhea (the clap), chlamydia, syphilis, trichomonas, herpes, HPV (human papilloma virus) and HIV (human immunodeficiency virus) which causes AIDS. The herpes, HIV and HPV are viral illnesses that have no cure. These can result in disability, cancer and death.  ?Keep carbon monoxide and smoke detectors in your home functioning at all times. Change the batteries every 6 months or use a model that plugs into the wall.  ? ?Vaccinations: ?Stay up to date with your tetanus shots and other required immunizations. You should have a booster for tetanus every 10 years. Be sure to get your flu shot every year, since 5%-20% of the [place] population comes  down with the flu. The flu vaccine changes each year, so being vaccinated once is not enough. Get your shot in the fall, before the flu season peaks.   ?Other vaccines to consider: ?Human Papilloma Virus or HPV causes

## 2021-05-04 DIAGNOSIS — Z6836 Body mass index (BMI) 36.0-36.9, adult: Secondary | ICD-10-CM | POA: Insufficient documentation

## 2021-05-04 DIAGNOSIS — R0683 Snoring: Secondary | ICD-10-CM | POA: Insufficient documentation

## 2021-05-04 DIAGNOSIS — Z6835 Body mass index (BMI) 35.0-35.9, adult: Secondary | ICD-10-CM | POA: Insufficient documentation

## 2021-05-04 DIAGNOSIS — G8929 Other chronic pain: Secondary | ICD-10-CM | POA: Insufficient documentation

## 2021-05-04 DIAGNOSIS — Z6833 Body mass index (BMI) 33.0-33.9, adult: Secondary | ICD-10-CM | POA: Insufficient documentation

## 2021-05-04 LAB — LIPID PANEL
Chol/HDL Ratio: 6.9 ratio — ABNORMAL HIGH (ref 0.0–4.4)
Cholesterol, Total: 152 mg/dL (ref 100–199)
HDL: 22 mg/dL — ABNORMAL LOW (ref >39)
LDL Chol Calc (NIH): 100 mg/dL — ABNORMAL HIGH (ref 0–99)
Triglycerides: 171 mg/dL — ABNORMAL HIGH (ref 0–149)
VLDL Cholesterol Cal: 30 mg/dL (ref 5–40)

## 2021-05-04 LAB — CBC WITH DIFFERENTIAL/PLATELET
Basophils Absolute: 0 10*3/uL (ref 0.0–0.2)
Basos: 1 %
EOS (ABSOLUTE): 0.2 10*3/uL (ref 0.0–0.4)
Eos: 3 %
Hematocrit: 44.3 % (ref 34.0–46.6)
Hemoglobin: 14.8 g/dL (ref 11.1–15.9)
Immature Grans (Abs): 0 10*3/uL (ref 0.0–0.1)
Immature Granulocytes: 0 %
Lymphocytes Absolute: 1.8 10*3/uL (ref 0.7–3.1)
Lymphs: 25 %
MCH: 30.3 pg (ref 26.6–33.0)
MCHC: 33.4 g/dL (ref 31.5–35.7)
MCV: 91 fL (ref 79–97)
Monocytes Absolute: 0.5 10*3/uL (ref 0.1–0.9)
Monocytes: 6 %
Neutrophils Absolute: 4.8 10*3/uL (ref 1.4–7.0)
Neutrophils: 65 %
Platelets: 348 10*3/uL (ref 150–450)
RBC: 4.88 x10E6/uL (ref 3.77–5.28)
RDW: 11.8 % (ref 11.7–15.4)
WBC: 7.3 10*3/uL (ref 3.4–10.8)

## 2021-05-04 LAB — COMPREHENSIVE METABOLIC PANEL
ALT: 87 IU/L — ABNORMAL HIGH (ref 0–32)
AST: 29 IU/L (ref 0–40)
Albumin/Globulin Ratio: 2.1 (ref 1.2–2.2)
Albumin: 4.7 g/dL (ref 3.8–4.8)
Alkaline Phosphatase: 100 IU/L (ref 44–121)
BUN/Creatinine Ratio: 18 (ref 9–23)
BUN: 13 mg/dL (ref 6–20)
Bilirubin Total: <0.2 mg/dL (ref 0.0–1.2)
CO2: 23 mmol/L (ref 20–29)
Calcium: 8.8 mg/dL (ref 8.7–10.2)
Chloride: 103 mmol/L (ref 96–106)
Creatinine, Ser: 0.71 mg/dL (ref 0.57–1.00)
Globulin, Total: 2.2 g/dL (ref 1.5–4.5)
Glucose: 105 mg/dL — ABNORMAL HIGH (ref 70–99)
Potassium: 4.3 mmol/L (ref 3.5–5.2)
Sodium: 140 mmol/L (ref 134–144)
Total Protein: 6.9 g/dL (ref 6.0–8.5)
eGFR: 117 mL/min/{1.73_m2} (ref >59)

## 2021-05-04 LAB — TSH+FREE T4
Free T4: 1.11 ng/dL (ref 0.82–1.77)
TSH: 0.946 u[IU]/mL (ref 0.450–4.500)

## 2021-05-04 NOTE — Assessment & Plan Note (Addendum)
Stable, requests a referral to Ortho for follow up ?

## 2021-05-04 NOTE — Assessment & Plan Note (Signed)
Reports her Husband told her it's getting worse, requests a sleep study ?

## 2021-05-04 NOTE — Assessment & Plan Note (Signed)
Stable, requests a referral to Ortho for follow up ?

## 2021-05-04 NOTE — Assessment & Plan Note (Signed)
CMP drawn today ?If you take OTC Tylenol and drink alcohol, decrease the amounts of both ?Drink 8 - 10 glasses a day or about 64 ounces a day ?You can add the OTC supplement milk thistle which supports the liver ?

## 2021-05-04 NOTE — Assessment & Plan Note (Signed)
Stable, drink 8 - 10 glasses of water daily, limit sugar intake and eat a low fat diet, exercise 3 - 5 days a week, example walking 1 - 2 miles  ? ?

## 2021-05-04 NOTE — Assessment & Plan Note (Addendum)
diet controlled, liid panel drawn today, eat a low fat diet, increase fiber intake (Benefiber or Metamucil, Cherrios,  oatmeal, beans, nuts, fruits and vegetables), limit saturated fats (in fried foods, red meat), can add OTC fish oil supplement, eat fish with Omega-3 fatty acids like salmon and tuna, exercise for 30 minutes 3 - 5 times a week, drink 8 - 10 glasses of water a day ? ? ?

## 2021-05-20 DIAGNOSIS — F5101 Primary insomnia: Secondary | ICD-10-CM | POA: Insufficient documentation

## 2021-05-20 NOTE — Assessment & Plan Note (Signed)
Reports insomnia for over 2 years and trazodone helps ?

## 2021-06-04 NOTE — Addendum Note (Signed)
Addended by: Burnard Hawthorne on: 06/04/2021 02:43 PM ? ? Modules accepted: Level of Service ? ?

## 2021-06-08 DIAGNOSIS — M1990 Unspecified osteoarthritis, unspecified site: Secondary | ICD-10-CM | POA: Insufficient documentation

## 2021-07-07 ENCOUNTER — Other Ambulatory Visit (INDEPENDENT_AMBULATORY_CARE_PROVIDER_SITE_OTHER): Payer: 59

## 2021-07-07 DIAGNOSIS — R748 Abnormal levels of other serum enzymes: Secondary | ICD-10-CM

## 2021-07-07 LAB — HEPATIC FUNCTION PANEL
ALT: 32 U/L (ref 0–35)
AST: 22 U/L (ref 0–37)
Albumin: 4.5 g/dL (ref 3.5–5.2)
Alkaline Phosphatase: 63 U/L (ref 39–117)
Bilirubin, Direct: 0.1 mg/dL (ref 0.0–0.3)
Total Bilirubin: 0.3 mg/dL (ref 0.2–1.2)
Total Protein: 7.4 g/dL (ref 6.0–8.3)

## 2021-07-08 NOTE — Progress Notes (Signed)
Christine Schneider,  Your liver enzymes have normalized.  This is great news.  I think we should check the levels again in another 6 months to ensure they remain normal.

## 2021-07-09 ENCOUNTER — Other Ambulatory Visit: Payer: Self-pay

## 2021-07-09 DIAGNOSIS — R748 Abnormal levels of other serum enzymes: Secondary | ICD-10-CM

## 2021-07-14 ENCOUNTER — Ambulatory Visit (INDEPENDENT_AMBULATORY_CARE_PROVIDER_SITE_OTHER): Payer: 59 | Admitting: Neurology

## 2021-07-14 ENCOUNTER — Encounter: Payer: Self-pay | Admitting: Neurology

## 2021-07-14 VITALS — BP 103/63 | HR 64 | Ht 64.0 in | Wt 194.0 lb

## 2021-07-14 DIAGNOSIS — R0681 Apnea, not elsewhere classified: Secondary | ICD-10-CM | POA: Diagnosis not present

## 2021-07-14 DIAGNOSIS — E669 Obesity, unspecified: Secondary | ICD-10-CM

## 2021-07-14 DIAGNOSIS — R0683 Snoring: Secondary | ICD-10-CM

## 2021-07-14 DIAGNOSIS — G4719 Other hypersomnia: Secondary | ICD-10-CM

## 2021-07-14 DIAGNOSIS — R519 Headache, unspecified: Secondary | ICD-10-CM

## 2021-07-14 DIAGNOSIS — Z820 Family history of epilepsy and other diseases of the nervous system: Secondary | ICD-10-CM

## 2021-07-14 DIAGNOSIS — E66811 Obesity, class 1: Secondary | ICD-10-CM

## 2021-07-14 NOTE — Progress Notes (Signed)
Subjective:    Patient ID: Christine Schneider is a 31 y.o. female.  HPI    Huston FoleySaima Vidur Knust, MD, PhD [facility] [address], [address] [address] [place], [place] [id number]  Dear Orlie PollenLynne,   I saw your patient, Christine JamesGabrielle Livingood, requesting a sleep clinic today for initial consultation of her sleep disorder, in particular, concern for underlying obstructive sleep apnea.  The patient is unaccompanied today.  As you know, Ms. Christine Schneider is a 31 year old right-handed woman with an underlying medical history of PCOS, allergies, anxiety, depression, RLS, asthma, reflux disease, hyperlipidemia, SVT, and obesity, who reports snoring, sleep disruption and irregular breathing at night as well as daytime somnolence.  I reviewed your office note from 05/03/2021.  Her Epworth sleepiness score is 6 out of 24, fatigue severity score is 54 out of 63.  She has gained weight since having children, she has a 31-year-old son and 31-year-old daughter, her husband reports snoring and also apneic pauses to her.  She has woken up occasionally with a dull, achy headache, has had infrequent migrainous headaches in the past as well.  She is currently staying at home with her kids.  She is a Museum/gallery conservatorvet tech.  She has 1 cat and 3 dogs in the household.  Bedtime is generally around 10 PM and rise time around 9 AM.  She does not have night to night nocturia.  Her sister has sleep apnea on a CPAP machine on her husband is supposed to be on a CPAP machine as well.  She is a non-smoker, she drinks alcohol rarely, she drinks caffeine in the form of coffee, typically 1 cup in the morning, today, she had half of an energy drink.  She reports having a sleep study some 10 years ago and she did not have any significant snoring or sleep apnea at the time.  She reports that she was told she had restless leg symptoms.  She takes gabapentin twice daily per psychiatry but not really for her restless legs, more so for her anxiety.  She has also  been on trazodone for sleep per psychiatry for the past 2 years or so.  Her Past Medical History Is Significant For: Past Medical History:  Diagnosis Date   Abrasion of hand 09/09/2013   bilateral   Anxiety    Asthma    Complication of anesthesia    hair loss after anesthesia   Depression    Dysrhythmia    svt  treated   Elevated ALT measurement 02/05/2018   Esophagitis    GERD (gastroesophageal reflux disease)    OTC as needed   Gestational diabetes    H/O hiatal hernia    High cholesterol    no current med.   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    Her Past Surgical History Is Significant For: Past Surgical History:  Procedure Laterality Date   ABLATION     HIP SURGERY Right 07/07/2011   2 surgeries  INTRAUTERINE DEVICE (IUD) INSERTION     ULNAR NERVE TRANSPOSITION Right 09/13/2013   Procedure: RIGHT ELBOW ULNAR NERVE RELEASE ANTERIOR TRANSPOSITION WITH REPAIR AND RECONSTRUCTION ;  Surgeon: Dominica Severin, MD;  Location: [facility];  Service: Orthopedics;  Laterality: Right;    Her Family History Is Significant For: Family History  Problem Relation Age of Onset   Hyperlipidemia Mother    Hypertension Father    Depression Sister    Hypothyroidism Daughter        congenital   Breast cancer Paternal Aunt    Lymphoma Maternal Grandfather        B cell   Heart disease Maternal Grandfather    Heart attack Paternal Grandmother    Diabetes type I Paternal Grandfather    Esophageal cancer Other        mat  great uncle, started in his mouth, smoker   Colon cancer Neg Hx    CVA Neg Hx     Her Social History Is Significant For: Social History   Socioeconomic History   Marital status: Married    Spouse name: Not on file   Number of children: 2   Years of education: Not on file   Highest education level: Not on file  Occupational History   Occupation: homemaker  Tobacco Use   Smoking status: Never   Smokeless tobacco: Never  Vaping Use   Vaping Use: Never used  Substance and Sexual Activity   Alcohol use: No   Drug use: No   Sexual activity: Yes    Birth control/protection: I.U.D.  Other Topics Concern   Not on file  Social History Narrative   Caffeine one coffee daily.    Education : some college   Work not currently, (former Museum/gallery conservator, Conservator, museum/gallery - future).    Social Determinants of Health   Financial Resource Strain: Not on file  Food Insecurity: Not on file  Transportation Needs: Not on file  Physical Activity: Not on file  Stress: Not on file  Social Connections: Not on file    Her Allergies Are:  Allergies  Allergen Reactions   Percocet [Oxycodone-Acetaminophen] Nausea And Vomiting   Hydromorphone Itching and Other (See Comments)    Dilaudid  :   Her Current Medications Are:  Outpatient Encounter Medications as of 07/14/2021  Medication Sig   albuterol (VENTOLIN HFA) 108 (90 Base) MCG/ACT inhaler Inhale 2 puffs into the lungs every 6 (six) hours as needed for wheezing or shortness of breath.   amphetamine-dextroamphetamine (ADDERALL XR) 20 MG 24 hr capsule Take 20 mg by mouth daily.   DULoxetine (CYMBALTA) 30 MG capsule Take 30 mg by mouth daily.   DULoxetine (CYMBALTA) 60 MG capsule Take 60 mg by mouth daily.   gabapentin (NEURONTIN) 300 MG capsule Take 300 mg by mouth daily.   levocetirizine (XYZAL) 5 MG tablet Take 1 tablet by mouth daily.   metoprolol succinate (TOPROL-XL) 25 MG 24 hr tablet Take 75 mg by mouth daily.   metoprolol tartrate (LOPRESSOR) 25  MG tablet Take by mouth.   Multiple Vitamins-Minerals (MULTIVITAMIN WITH MINERALS) tablet Take 1 tablet by mouth daily.   norethindrone (MICRONOR) 0.35 MG tablet Take 1 tablet by mouth daily.   Omega-3 Fatty Acids (FISH OIL PO) Take by mouth.   RESVERATROL PO Take by mouth.   TRAZODONE HCL PO Take 50 mg by mouth at bedtime.   [DISCONTINUED] levocetirizine (XYZAL) 5 MG tablet Take 5 mg by mouth every evening.   [DISCONTINUED]  Multiple Vitamins-Minerals (ZINC PO) Take 11 mg by mouth every 4 (four) hours.   No facility-administered encounter medications on file as of 07/14/2021.  :   Review of Systems:  Out of a complete 14 point review of systems, all are reviewed and negative with the exception of these symptoms as listed below:   Review of Systems  Neurological:        Snoring, disruptive breathing while sleeping and insomnia.  SS over 10 yrs ago showed RLS (in [place]).  ESS 6, FSS 54.    Objective:  Neurological Exam  Physical Exam Physical Examination:   Vitals:   07/14/21 1023  BP: 103/63  Pulse: 64    General Examination: The patient is a very pleasant 31 y.o. female in no acute distress. She appears well-developed and well-nourished and well groomed.   HEENT: Normocephalic, atraumatic, pupils are equal, round and reactive to light, extraocular tracking is good without limitation to gaze excursion or nystagmus noted. Hearing is grossly intact. Face is symmetric with normal facial animation. Speech is clear with no dysarthria noted. There is no hypophonia. There is no lip, neck/head, jaw or voice tremor. Neck is supple with full range of passive and active motion. There are no carotid bruits on auscultation. Oropharynx exam reveals: mild mouth dryness, good dental hygiene and moderate airway crowding, due to tonsillar size of about 2-3+ and small airway entry.  Mallampati class II.  Neck circumference of 15-7/8 inches.  Minimal overbite.  Tongue protrudes centrally and  palate elevates symmetrically.  Chest: Clear to auscultation without wheezing, rhonchi or crackles noted.  Heart: S1+S2+0, regular and normal without murmurs, rubs or gallops noted.   Abdomen: Soft, non-tender and non-distended.  Extremities: There is no pitting edema in the distal lower extremities bilaterally.   Skin: Warm and dry without trophic changes noted.   Musculoskeletal: exam reveals no obvious joint deformities.   Neurologically:  Mental status: The patient is awake, alert and oriented in all 4 spheres. His immediate and remote memory, attention, language skills and fund of knowledge are appropriate. There is no evidence of aphasia, agnosia, apraxia or anomia. Speech is clear with normal prosody and enunciation. Thought process is linear. Mood is normal and affect is normal.  Cranial nerves II - XII are as described above under HEENT exam.  Motor exam: Normal bulk, strength and tone is noted. There is no obvious tremor.   Fine motor skills and coordination: grossly intact.  Cerebellar testing: No dysmetria or intention tremor. There is no truncal or gait ataxia.  Sensory exam: intact to light touch in the upper and lower extremities.  Gait, station and balance: She stands easily. No veering to one side is noted. No leaning to one side is noted. Posture is age-appropriate and stance is narrow based. Gait shows normal stride length and normal pace. No problems turning are noted.   Assessment and Plan:  In summary, BAYLA MCGOVERN is a very pleasant 31 y.o.-year old female with an underlying medical history of PCOS, allergies, anxiety, depression, RLS, asthma, reflux disease, hyperlipidemia, SVT, and obesity, whose history and physical exam are concerning for obstructive sleep apnea (OSA). I had a long chat with the patient about my findings and the diagnosis of OSA, its prognosis and treatment options. We talked about medical treatments, surgical interventions and  non-pharmacological approaches. I explained in particular the risks and ramifications of untreated moderate to severe OSA, especially with respect to developing cardiovascular disease down the Road, including congestive  heart failure, difficult to treat hypertension, cardiac arrhythmias, or stroke. Even type 2 diabetes has, in part, been linked to untreated OSA. Symptoms of untreated OSA include daytime sleepiness, memory problems, mood irritability and mood disorder such as depression and anxiety, lack of energy, as well as recurrent headaches, especially morning headaches. We talked about trying to maintain a healthy lifestyle in general, as well as the importance of weight control. We also talked about the importance of good sleep hygiene. I recommended the following at this time: sleep study.  I outlined the differences between a laboratory attended sleep study versus home sleep testing. I explained the sleep test procedure to the patient and also outlined possible surgical and non-surgical treatment options of OSA, including the use of a custom-made dental device (which would require a referral to a specialist dentist or oral surgeon), upper airway surgical options, such as traditional UPPP or a novel less invasive surgical option in the form of Inspire hypoglossal nerve stimulation (which would involve a referral to an ENT surgeon). I also explained the CPAP treatment option to the patient, who indicated that she would be willing to try PAP therapy, if the need arises.  We will pick up our discussion after testing.  We will keep her posted as to her test results by phone call and plan a follow-up in sleep clinic accordingly.  I answered all her questions today and she was in agreement.    Thank you very much for allowing me to participate in the care of this nice patient. If I can be of any further assistance to you please do not hesitate to call me at [phone].  Sincerely,   Huston Foley, MD,  PhD

## 2021-07-14 NOTE — Patient Instructions (Signed)

## 2021-07-30 ENCOUNTER — Encounter: Payer: Self-pay | Admitting: Internal Medicine

## 2021-08-24 ENCOUNTER — Telehealth: Payer: Self-pay | Admitting: Neurology

## 2021-08-24 ENCOUNTER — Ambulatory Visit (INDEPENDENT_AMBULATORY_CARE_PROVIDER_SITE_OTHER): Payer: 59 | Admitting: Medical

## 2021-08-24 VITALS — BP 110/70 | HR 87 | Temp 98.6°F | Wt 195.8 lb

## 2021-08-24 DIAGNOSIS — Z20818 Contact with and (suspected) exposure to other bacterial communicable diseases: Secondary | ICD-10-CM | POA: Diagnosis not present

## 2021-08-24 DIAGNOSIS — L989 Disorder of the skin and subcutaneous tissue, unspecified: Secondary | ICD-10-CM | POA: Diagnosis not present

## 2021-08-24 DIAGNOSIS — L732 Hidradenitis suppurativa: Secondary | ICD-10-CM | POA: Diagnosis not present

## 2021-08-24 MED ORDER — SULFAMETHOXAZOLE-TRIMETHOPRIM 800-160 MG PO TABS: 1.0000 | ORAL_TABLET | Freq: Two times a day (BID) | ORAL | 0 refills | Status: DC

## 2021-08-24 MED ORDER — CHLORHEXIDINE GLUCONATE 4 % EX LIQD
Freq: Every day | CUTANEOUS | 1 refills | Status: DC | PRN
Start: 2021-08-24 — End: 2022-02-21

## 2021-08-24 MED ORDER — MUPIROCIN 2 % EX OINT: 1.0000 | TOPICAL_OINTMENT | Freq: Two times a day (BID) | CUTANEOUS | 1 refills | Status: DC

## 2021-08-24 NOTE — Telephone Encounter (Signed)
HST- [employer] no auth req. Patient is scheduled at [facility] for 09/14/21 at 10:30 AM. I mailed packet to the patient.

## 2021-08-24 NOTE — Progress Notes (Signed)
Subjective:  Christine Schneider is a 31 y.o. female who presents for Chief Complaint  Patient presents with   Acute Visit    Daughter had MRSA last week and she has a painful bump under right arm that started draining last night.     Here for skin lesion in the right axilla.  She notes no prior history of hidradenitis or boils in the armpit.  She has a red very tender area that started last week and drained some last night.  Her daughter was recently diagnosed with MRSA abscess and had to have incision and drainage.  Daughter is currently on 2 different antibiotics.  Vicente Serene denies fever body aches or chills.  The main issue is pain and discomfort in the right axilla.  No other aggravating or relieving factors.    No other c/o.  The following portions of the patient's history were reviewed and updated as appropriate: allergies, current medications, past family history, past medical history, past social history, past surgical history and problem list.  ROS Otherwise as in subjective above  Objective: BP 110/70   Pulse 87   Temp 98.6 F (37 C)   Wt 195 lb 12.8 oz (88.8 kg)   SpO2 96%   BMI 33.61 kg/m   General appearance: alert, no distress, well developed, well nourished Skin: 1.5 cm diameter erythematous tender area in the right axilla superficial suggestive of hidradenitis, there are several small roundish red scabs on both lower legs where she has been picking but none look particularly infected or indurated or fluctuant     Assessment: Encounter Diagnoses  Name Primary?   Hidradenitis axillaris Yes   Skin lesion    MRSA exposure      Plan: We discussed doing a body wash to the members of the household, body wash Hibiclens leave on for 10 minutes then rinse off.  Can do this a few times per week.  We discussed using Bactroban ointment intranasally to decrease colonization of MRSA for household members.  Begin Bactrim antibiotic and warm compresses to the right armpit.   Switch out to a new fresh deodorant once this lesion heals up.  If not resolving over the next week then call or recheck  Christine Schneider was seen today for acute visit.  Diagnoses and all orders for this visit:  Hidradenitis axillaris  Skin lesion  MRSA exposure  Other orders -     sulfamethoxazole-trimethoprim (BACTRIM DS) 800-160 MG tablet; Take 1 tablet by mouth 2 (two) times daily. -     chlorhexidine (HIBICLENS) 4 % external liquid; Apply topically daily as needed. -     mupirocin ointment (BACTROBAN) 2 %; Apply 1 Application topically 2 (two) times daily.    Follow up: prn

## 2021-09-08 DIAGNOSIS — G5603 Carpal tunnel syndrome, bilateral upper limbs: Secondary | ICD-10-CM | POA: Insufficient documentation

## 2021-09-14 ENCOUNTER — Ambulatory Visit: Payer: 59 | Admitting: Neurology

## 2021-09-14 DIAGNOSIS — R519 Headache, unspecified: Secondary | ICD-10-CM

## 2021-09-14 DIAGNOSIS — E669 Obesity, unspecified: Secondary | ICD-10-CM

## 2021-09-14 DIAGNOSIS — R0681 Apnea, not elsewhere classified: Secondary | ICD-10-CM

## 2021-09-14 DIAGNOSIS — G4719 Other hypersomnia: Secondary | ICD-10-CM

## 2021-09-14 DIAGNOSIS — R0683 Snoring: Secondary | ICD-10-CM

## 2021-09-14 DIAGNOSIS — G4733 Obstructive sleep apnea (adult) (pediatric): Secondary | ICD-10-CM

## 2021-09-14 DIAGNOSIS — Z820 Family history of epilepsy and other diseases of the nervous system: Secondary | ICD-10-CM

## 2021-09-15 NOTE — Progress Notes (Signed)
See procedure note.

## 2021-09-20 NOTE — Addendum Note (Signed)
Addended by: Huston Foley on: 09/20/2021 06:46 PM   Modules accepted: Orders

## 2021-09-20 NOTE — Procedures (Signed)
   [facility]  HOME SLEEP TEST (Watch PAT) REPORT  STUDY DATE: 09/14/2021  DOB: 05-Jan-1991  MRN: [id number]  ORDERING CLINICIAN: Huston Foley, MD, PhD   REFERRING CLINICIAN: Jake Shark, PA-C   CLINICAL INFORMATION/HISTORY: 31 year old right-handed woman with an underlying medical history of PCOS, allergies, anxiety, depression, RLS, asthma, reflux disease, hyperlipidemia, SVT, and obesity, who reports snoring, sleep disruption and irregular breathing at night as well as daytime somnolence.  Epworth sleepiness score: 6/24.  BMI: 33.1 kg/m  FINDINGS:   Sleep Summary:   Total Recording Time (hours, min): 8 hours, 52 min  Total Sleep Time (hours, min):  7 hours, 49 min  Percent REM (%):    25.5%   Respiratory Indices:   Calculated pAHI (per hour):  15.6/hour         REM pAHI:    15.2/hour       NREM pAHI: 15.7/hour  Central pAHI: 1/hour  Oxygen Saturation Statistics:    Oxygen Saturation (%) Mean: 95%   Minimum oxygen saturation (%):                 88%   O2 Saturation Range (%): 88-99%    O2 Saturation (minutes) <=88%: 0.2 min  Pulse Rate Statistics:   Pulse Mean (bpm):    68/min    Pulse Range (46-98/min)   IMPRESSION: OSA (obstructive sleep apnea)   RECOMMENDATION:  This home sleep test demonstrates moderate obstructive sleep apnea with a total AHI of 15.6/hour and O2 nadir of 88%.  Mild to moderate snoring was detected. Treatment with a positive airway pressure (PAP) device is recommended. The patient will be advised to proceed with an autoPAP titration/trial at home for now. A full night titration study may be considered to optimize treatment settings, if needed down the road.  Alternative treatment options may include a dental device through dentistry or orthodontics in selected patients or Inspire (hypoglossal nerve stimulator) in carefully selected patients (meeting inclusion criteria).  Concomitant weight loss is recommended  (where clinically appropriate). Please note that untreated obstructive sleep apnea may carry additional perioperative morbidity. Patients with significant obstructive sleep apnea should receive perioperative PAP therapy and the surgeons and particularly the anesthesiologist should be informed of the diagnosis and the severity of the sleep disordered breathing. The patient should be cautioned not to drive, work at heights, or operate dangerous or heavy equipment when tired or sleepy. Review and reiteration of good sleep hygiene measures should be pursued with any patient. Other causes of the patient's symptoms, including circadian rhythm disturbances, an underlying mood disorder, medication effect and/or an underlying medical problem cannot be ruled out based on this test. Clinical correlation is recommended. The patient and his referring provider will be notified of the test results. The patient will be seen in follow up in sleep clinic at [facility].  I certify that I have reviewed the raw data recording prior to the issuance of this report in accordance with the standards of the American Academy of Sleep Medicine (AASM).  INTERPRETING PHYSICIAN:   Huston Foley, MD, PhD  Board Certified in Neurology and Sleep Medicine  [facility] [address], [address] [place], [place] [address] [phone]

## 2021-10-05 ENCOUNTER — Encounter: Payer: Self-pay | Admitting: Gastroenterology

## 2021-10-05 ENCOUNTER — Telehealth: Payer: Self-pay

## 2021-10-05 NOTE — Telephone Encounter (Signed)
-----   Message from Huston Foley, MD sent at 09/20/2021  6:46 PM EDT ----- Patient referred by PCP, seen by me on 07/14/2021, patient had a HST on 09/14/2021.    Please call and notify the patient that the recent home sleep test showed obstructive sleep apnea in the moderate range. I recommend treatment in the form of autoPAP, which means, that we don't have to bring her in for a sleep study with CPAP, but will let her start using a so called autoPAP machine at home, which is a CPAP-like machine with self-adjusting pressures. We will send the order to a local DME company (of her choice, or as per insurance requirement). The DME representative will fit her with a mask, educate her on how to use the machine, how to put the mask on, etc. I have placed an order in the chart. Please send the order, talk to patient, send report to referring MD. We will need a FU in sleep clinic for 10 weeks post-PAP set up, please arrange that with me or one of our NPs. Also reinforce the need for compliance with treatment. Thanks,   Huston Foley, MD, PhD [facility])

## 2021-10-05 NOTE — Telephone Encounter (Signed)
Patient returned my call. I advised pt that Dr. Frances Furbish reviewed their sleep study results and found that pt has moderate osa. Dr. Frances Furbish recommends that pt start an auto pap at home. I reviewed PAP compliance expectations with the pt. Pt is agreeable to starting an auto-PAP. I advised pt that an order will be sent to a DME, [facility], and [facility] will call the pt within about one week after they file with the pt's insurance. [facility] will show the pt how to use the machine, fit for masks, and troubleshoot the auto-PAP if needed. A follow up appt was made for insurance purposes with Dr. Frances Furbish on 02/16/22 at 10:45pm. Pt verbalized understanding to arrive 15 minutes early and bring their auto-PAP. A letter with all of this information in it will be mailed to the pt as a reminder. I verified with the pt that the address we have on file is correct. Pt verbalized understanding of results. Pt had no questions at this time but was encouraged to call back if questions arise. I have sent the order to [facility] and have received confirmation that they have received the order.

## 2021-10-05 NOTE — Telephone Encounter (Signed)
I called patient to discuss. No answer, left a message asking her to call me back. If patient calls back another day please route to POD 4. 

## 2021-10-06 ENCOUNTER — Encounter: Payer: Self-pay | Admitting: Internal Medicine

## 2021-10-06 ENCOUNTER — Emergency Department (HOSPITAL_COMMUNITY)
Admission: EM | Admit: 2021-10-06 | Discharge: 2021-10-06 | Disposition: A | Discharge status: Home / Self Care | Payer: 59 | Attending: Student | Admitting: Student

## 2021-10-06 ENCOUNTER — Other Ambulatory Visit: Payer: Self-pay

## 2021-10-06 ENCOUNTER — Encounter (HOSPITAL_COMMUNITY): Payer: Self-pay | Admitting: *Deleted

## 2021-10-06 DIAGNOSIS — N9489 Other specified conditions associated with female genital organs and menstrual cycle: Secondary | ICD-10-CM | POA: Insufficient documentation

## 2021-10-06 DIAGNOSIS — L299 Pruritus, unspecified: Secondary | ICD-10-CM | POA: Diagnosis not present

## 2021-10-06 DIAGNOSIS — R748 Abnormal levels of other serum enzymes: Secondary | ICD-10-CM

## 2021-10-06 DIAGNOSIS — R110 Nausea: Secondary | ICD-10-CM | POA: Insufficient documentation

## 2021-10-06 DIAGNOSIS — R631 Polydipsia: Secondary | ICD-10-CM | POA: Diagnosis not present

## 2021-10-06 DIAGNOSIS — Z79899 Other long term (current) drug therapy: Secondary | ICD-10-CM | POA: Insufficient documentation

## 2021-10-06 DIAGNOSIS — J45909 Unspecified asthma, uncomplicated: Secondary | ICD-10-CM | POA: Diagnosis not present

## 2021-10-06 LAB — URINALYSIS, ROUTINE W REFLEX MICROSCOPIC
Bilirubin Urine: NEGATIVE
Glucose, UA: NEGATIVE mg/dL
Hgb urine dipstick: NEGATIVE
Ketones, ur: NEGATIVE mg/dL
Leukocytes,Ua: NEGATIVE
Nitrite: NEGATIVE
Protein, ur: NEGATIVE mg/dL
Specific Gravity, Urine: 1.014 (ref 1.005–1.030)
pH: 6 (ref 5.0–8.0)

## 2021-10-06 LAB — COMPREHENSIVE METABOLIC PANEL
ALT: 96 U/L — ABNORMAL HIGH (ref 0–44)
AST: 36 U/L (ref 15–41)
Albumin: 4 g/dL (ref 3.5–5.0)
Alkaline Phosphatase: 72 U/L (ref 38–126)
Anion gap: 7 (ref 5–15)
BUN: 10 mg/dL (ref 6–20)
CO2: 27 mmol/L (ref 22–32)
Calcium: 9.4 mg/dL (ref 8.9–10.3)
Chloride: 104 mmol/L (ref 98–111)
Creatinine, Ser: 0.71 mg/dL (ref 0.44–1.00)
GFR, Estimated: >60 mL/min (ref >60)
Glucose, Bld: 95 mg/dL (ref 70–99)
Potassium: 4.2 mmol/L (ref 3.5–5.1)
Sodium: 138 mmol/L (ref 135–145)
Total Bilirubin: 0.8 mg/dL (ref 0.3–1.2)
Total Protein: 7.7 g/dL (ref 6.5–8.1)

## 2021-10-06 LAB — CBC WITH DIFFERENTIAL/PLATELET
Abs Immature Granulocytes: 0.01 10*3/uL (ref 0.00–0.07)
Basophils Absolute: 0.1 10*3/uL (ref 0.0–0.1)
Basophils Relative: 1 %
Eosinophils Absolute: 0.1 10*3/uL (ref 0.0–0.5)
Eosinophils Relative: 3 %
HCT: 42.6 % (ref 36.0–46.0)
Hemoglobin: 13.8 g/dL (ref 12.0–15.0)
Immature Granulocytes: 0 %
Lymphocytes Relative: 36 %
Lymphs Abs: 1.8 10*3/uL (ref 0.7–4.0)
MCH: 30.8 pg (ref 26.0–34.0)
MCHC: 32.4 g/dL (ref 30.0–36.0)
MCV: 95.1 fL (ref 80.0–100.0)
Monocytes Absolute: 0.4 10*3/uL (ref 0.1–1.0)
Monocytes Relative: 7 %
Neutro Abs: 2.7 10*3/uL (ref 1.7–7.7)
Neutrophils Relative %: 53 %
Platelets: 332 10*3/uL (ref 150–400)
RBC: 4.48 MIL/uL (ref 3.87–5.11)
RDW: 12.6 % (ref 11.5–15.5)
WBC: 5.1 10*3/uL (ref 4.0–10.5)
nRBC: 0 % (ref 0.0–0.2)

## 2021-10-06 LAB — AMMONIA: Ammonia: 25 umol/L (ref 9–35)

## 2021-10-06 LAB — I-STAT BETA HCG BLOOD, ED (MC, WL, AP ONLY): I-stat hCG, quantitative: <5 m[IU]/mL (ref <5)

## 2021-10-06 MED ORDER — HYDROXYZINE HCL 25 MG PO TABS: 25.0000 mg | ORAL_TABLET | Freq: Four times a day (QID) | ORAL | 0 refills | Status: DC

## 2021-10-06 MED ORDER — ONDANSETRON HCL 4 MG/2ML IJ SOLN: 4.0000 mg | Freq: Once | INTRAMUSCULAR | Status: DC

## 2021-10-06 MED ORDER — PROCHLORPERAZINE EDISYLATE 10 MG/2ML IJ SOLN
10.0000 mg | Freq: Once | INTRAMUSCULAR | Status: AC
  Administered 2021-10-06: 10 mg via INTRAVENOUS
  Filled 2021-10-06: qty 2

## 2021-10-06 MED ORDER — DIPHENHYDRAMINE HCL 50 MG/ML IJ SOLN
25.0000 mg | Freq: Once | INTRAMUSCULAR | Status: AC
  Administered 2021-10-06: 25 mg via INTRAVENOUS
  Filled 2021-10-06: qty 1

## 2021-10-06 MED ORDER — PROCHLORPERAZINE MALEATE 10 MG PO TABS
10.0000 mg | ORAL_TABLET | Freq: Two times a day (BID) | ORAL | 0 refills | Status: DC | PRN
Start: 2021-10-06 — End: 2022-02-21

## 2021-10-06 MED ORDER — LACTATED RINGERS IV BOLUS
1000.0000 mL | Freq: Once | INTRAVENOUS | Status: AC
  Administered 2021-10-06: 1000 mL via INTRAVENOUS

## 2021-10-06 MED ORDER — HYDROXYZINE HCL 25 MG PO TABS: 25.0000 mg | ORAL_TABLET | Freq: Every evening | ORAL | 0 refills | Status: DC

## 2021-10-06 NOTE — ED Provider Triage Note (Signed)
Emergency Medicine Provider Triage Evaluation Note  Christine Schneider , a 31 y.o. female  was evaluated in triage.  Pt states that she has a history of elevated liver enzymes which was suspected to be due to a medication side effect from her flecainide.  She has discontinued that medication.  She has been following with Dr. Tomasa Rand in gastroenterology and had her liver values checked within the last month which were fine.  However over the last couple of days, she has noted symptoms of her liver dysfunction returning including pruritus, severe nausea, polyuria, polydipsia.  She states that with the amount of water she has been drinking, her urine should be clear, however it is a dark yellow.  Review of Systems  Positive: As above Negative: Abdominal pain, vomiting, diarrhea  Physical Exam  BP 114/69 (BP Location: Right Arm)   Pulse 60   Temp 98.1 F (36.7 C) (Oral)   Resp 18   Ht 5\' 4"  (1.626 m)   Wt 87.5 kg   SpO2 98%   BMI 33.13 kg/m  Gen:   Awake, no distress   Resp:  Normal effort  MSK:   Moves extremities without difficulty  Other:  Abdomen is soft, nontender  Medical Decision Making  Medically screening exam initiated at 12:35 PM.  Appropriate orders placed.  Christine Schneider was informed that the remainder of the evaluation will be completed by another provider, this initial triage assessment does not replace that evaluation, and the importance of remaining in the ED until their evaluation is complete.     Ellery Plunk, Janell Quiet 10/06/21 1237

## 2021-10-06 NOTE — ED Provider Notes (Signed)
[facility]-EMERGENCY DEPT Provider Note  CSN: [id number] Arrival date & time: 10/06/21 1149  Chief Complaint(s) Pruritis, Nausea, ? Liver Issues, and Polydipsia  HPI Christine Schneider is a 31 y.o. female with PMH PCOS, restless leg syndrome, esophagitis who presents emergency department for evaluation of multiple complaints including nausea, polydipsia and pruritus. Patient states that her symptoms have been gradually worsening primarily driven by significant nausea and the patient thought that maybe her liver was failing.  She also thought she might be pregnant but took 2 home pregnancy test that were reassuringly negative.  Currently denies chest pain, shortness of breath, abdominal pain, fever or other systemic symptoms.  She now endorses pruritus of unknown etiology.   Past Medical History Past Medical History:  Diagnosis Date   Abrasion of hand 09/09/2013   bilateral   Anxiety    Asthma    Complication of anesthesia    hair loss after anesthesia   Depression    Dysrhythmia    svt  treated   Elevated ALT measurement 02/05/2018   Esophagitis    GERD (gastroesophageal reflux disease)    OTC as needed   Gestational diabetes    H/O hiatal hernia    High cholesterol    no current med.   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   Patient Active Problem List   Diagnosis Date Noted   Primary insomnia 05/20/2021   Chronic hip pain, bilateral 05/04/2021   Chronic pain of right knee 05/04/2021   Snoring 05/04/2021    Body mass index (BMI) of 33.0 to 33.9 in adult 05/04/2021   Fatty liver disease, nonalcoholic 08/06/2020   Paroxysmal atrial tachycardia (HCC) 11/20/2019   Mixed hyperlipidemia    Hypoxic episode 11/30/2018   Hearing reduced, bilateral 11/30/2018   S/P hip arthroscopy 09/14/2018   Right hip pain 04/15/2018   PCOS (polycystic ovarian syndrome) 04/15/2018   Raynaud's disease 04/15/2018   RLS (restless legs syndrome) 04/15/2018   Elevated ALT measurement 02/05/2018   SVT (supraventricular tachycardia) (HCC) 02/01/2018   History of gestational diabetes 08/28/2017   Alopecia areata 08/28/2017   Hair thinning 08/28/2017   Fatigue 03/08/2017   Hot flashes 03/08/2017   Weight gain 03/08/2017   Polydipsia 03/08/2017   IUD (intrauterine device) in place 03/08/2017   Viral gastroenteritis 06/14/2016   ADD (attention deficit disorder) 05/27/2016   Anxiety and depression 05/27/2016   Bipolar depression (HCC) 05/27/2016   History of atrial flutter 05/27/2016   Bipolar I disorder, most recent episode depressed, mild (HCC) 12/30/2015   Somatic symptom disorder 12/30/2015   Severe episode of recurrent major depressive disorder, without psychotic features (HCC) 09/30/2015   Attention deficit hyperactivity disorder, 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 Authorizing Provider  albuterol (VENTOLIN HFA) 108 (90 Base) MCG/ACT inhaler Inhale 2  puffs into the lungs every 6 (six) hours as needed for wheezing or shortness of breath. 04/15/19   Steffanie Dunn, DO  amphetamine-dextroamphetamine (ADDERALL XR) 20 MG 24 hr capsule Take 20 mg by mouth daily.    [provider]  chlorhexidine (HIBICLENS) 4 % external liquid Apply topically daily as needed. 08/24/21   Tysinger, Kermit Balo, PA-C  DULoxetine (CYMBALTA) 30 MG  capsule Take 30 mg by mouth daily.    [provider]  DULoxetine (CYMBALTA) 60 MG capsule Take 60 mg by mouth daily.    [provider]  gabapentin (NEURONTIN) 300 MG capsule Take 300 mg by mouth daily.    [provider]  levocetirizine (XYZAL) 5 MG tablet Take 1 tablet by mouth daily.    [provider]  metoprolol succinate (TOPROL-XL) 25 MG 24 hr tablet Take 75 mg by mouth daily.    [provider]  Multiple Vitamins-Minerals (MULTIVITAMIN WITH MINERALS) tablet Take 1 tablet by mouth daily.    [provider]  mupirocin ointment (BACTROBAN) 2 % Apply 1 Application topically 2 (two) times daily. 08/24/21   Tysinger, Kermit Balo, PA-C  norethindrone (MICRONOR) 0.35 MG tablet Take 1 tablet by mouth daily.    [provider]  Omega-3 Fatty Acids (FISH OIL PO) Take by mouth.    [provider]  RESVERATROL PO Take by mouth.    [provider]  sulfamethoxazole-trimethoprim (BACTRIM DS) 800-160 MG tablet Take 1 tablet by mouth 2 (two) times daily. 08/24/21   Tysinger, Kermit Balo, PA-C  TRAZODONE HCL PO Take 50 mg by mouth at bedtime.    [provider]                                                                                                                                    Past Surgical History Past Surgical History:  Procedure Laterality Date   ABLATION     HIP SURGERY Right 07/07/2011   2 surgeries   INTRAUTERINE DEVICE (IUD) INSERTION     ULNAR NERVE TRANSPOSITION Right 09/13/2013   Procedure: RIGHT ELBOW ULNAR NERVE RELEASE ANTERIOR TRANSPOSITION WITH REPAIR AND RECONSTRUCTION ;  Surgeon: Dominica Severin, MD;  Location: [facility];  Service: Orthopedics;  Laterality: Right;   Family History Family History  Problem Relation Age of Onset   Hyperlipidemia Mother    Hypertension Father    Depression Sister    Hypothyroidism Daughter        congenital   Breast cancer Paternal Aunt     Lymphoma Maternal Grandfather        B cell   Heart disease Maternal Grandfather    Heart attack Paternal Grandmother    Diabetes type I Paternal Grandfather    Esophageal cancer Other        mat great uncle, started in his mouth, smoker   Colon cancer Neg Hx    CVA Neg  Hx     Social History Social History   Tobacco Use   Smoking status: Never   Smokeless tobacco: Never  Vaping Use   Vaping Use: Never used  Substance Use Topics   Alcohol use: No   Drug use: No   Allergies Percocet [oxycodone-acetaminophen] and Hydromorphone  Review of Systems Review of Systems  Gastrointestinal:  Positive for nausea.    Physical Exam Vital Signs  I have reviewed the triage vital signs BP 137/78 (BP Location: Left Arm)   Pulse 74   Temp 98.7 F (37.1 C) (Oral)   Resp 20   Ht 5\' 4"  (1.626 m)   Wt 87.5 kg   SpO2 100%   BMI 33.13 kg/m   Physical Exam Vitals and nursing note reviewed.  Constitutional:      General: She is not in acute distress.    Appearance: She is well-developed.  HENT:     Head: Normocephalic and atraumatic.  Eyes:     Conjunctiva/sclera: Conjunctivae normal.  Cardiovascular:     Rate and Rhythm: Normal rate and regular rhythm.     Heart sounds: No murmur heard. Pulmonary:     Effort: Pulmonary effort is normal. No respiratory distress.     Breath sounds: Normal breath sounds.  Abdominal:     Palpations: Abdomen is soft.     Tenderness: There is no abdominal tenderness.  Musculoskeletal:        General: No swelling.     Cervical back: Neck supple.  Skin:    General: Skin is warm and dry.     Capillary Refill: Capillary refill takes less than 2 seconds.  Neurological:     Mental Status: She is alert.  Psychiatric:        Mood and Affect: Mood normal.     ED Results and Treatments Labs (all labs ordered are listed, but only abnormal results are displayed) Labs Reviewed  COMPREHENSIVE METABOLIC PANEL - Abnormal; Notable for the following  components:      Result Value   ALT 96 (*)    All other components within normal limits  CBC WITH DIFFERENTIAL/PLATELET  URINALYSIS, ROUTINE W REFLEX MICROSCOPIC  AMMONIA  PREGNANCY, URINE                                                                                                                          Radiology No results found.  Pertinent labs & imaging results that were available during my care of the patient were reviewed by me and considered in my medical decision making (see MDM for details).  Medications Ordered in ED Medications - No data to display  Procedures Procedures  (including critical care time)  Medical Decision Making / ED Course   This patient presents to the ED for concern of itching, nausea, this involves an extensive number of treatment options, and is a complaint that carries with it a high risk of complications and morbidity.  The differential diagnosis includes urticaria, atopy, viral illness, renal failure  MDM: Seen emergency room for evaluation of multiple complaints described above.  Physical exam is largely unremarkable with no visible urticaria.  Laboratory evaluation unremarkable.  Pregnancy negative.  Headache cocktail was prescribed to treat both the nausea and the pruritus and on reevaluation, patient states she feels generally the same but would like to be discharged.  She states she is primarily at the emergency department today to check her kidney and liver function and because both of these are normal she would like to go home.  This is not unreasonable and she was discharged with Atarax and Compazine.   Additional history obtained:  -External records from outside source obtained and reviewed including: Chart review including previous notes, labs, imaging, consultation notes   Lab Tests: -I ordered,  reviewed, and interpreted labs.   The pertinent results include:   Labs Reviewed  COMPREHENSIVE METABOLIC PANEL - Abnormal; Notable for the following components:      Result Value   ALT 96 (*)    All other components within normal limits  CBC WITH DIFFERENTIAL/PLATELET  URINALYSIS, ROUTINE W REFLEX MICROSCOPIC  AMMONIA  PREGNANCY, URINE        Medicines ordered and prescription drug management: No orders of the defined types were placed in this encounter.   -I have reviewed the patients home medicines and have made adjustments as needed  Critical interventions none  Cardiac Monitoring: The patient was maintained on a cardiac monitor.  I personally viewed and interpreted the cardiac monitored which showed an underlying rhythm of: NSR  Social Determinants of Health:  Factors impacting patients care include: none   Reevaluation: After the interventions noted above, I reevaluated the patient and found that they have :stayed the same  Co morbidities that complicate the patient evaluation  Past Medical History:  Diagnosis Date   Abrasion of hand 09/09/2013   bilateral   Anxiety    Asthma    Complication of anesthesia    hair loss after anesthesia   Depression    Dysrhythmia    svt  treated   Elevated ALT measurement 02/05/2018   Esophagitis    GERD (gastroesophageal reflux disease)    OTC as needed   Gestational diabetes    H/O hiatal hernia    High cholesterol    no current med.   Hip joint pain 08/2013   right - states has a problem with labrum   Mixed hyperlipidemia    Multiple allergies    states is allergic to everything except ragweed and beef   Narcotic-induced nausea and vomiting    states needs Phenergan if Percocet is prescribed   Obesity    PCOS (polycystic ovarian syndrome)    Pollen allergy    PONV (postoperative nausea and vomiting)    Restless leg syndrome    SVT (supraventricular tachycardia) (HCC)    Ulnar nerve entrapment at elbow  08/2013      Dispostion: I considered admission for this patient, but she does not meet inpatient criteria for admission she is safe for discharge with outpatient follow-up     Final Clinical Impression(s) / ED Diagnoses Final diagnoses:  None     @  Charlyne Petrin, MD 10/06/21 2203

## 2021-10-06 NOTE — ED Notes (Signed)
Pt teaching provided on medications that may cause drowsiness. Pt instructed not to drive or operate heavy machinery while taking the prescribed medication. Pt verbalized understanding.   Pt provided discharge instructions and prescription information. Pt was given the opportunity to ask questions and questions were answered.   

## 2021-10-06 NOTE — ED Triage Notes (Signed)
States she has Liver issues, now feels symptoms are returning, itching, nauesa, loss of appetite, excessive thirst. Urine amber color

## 2021-10-07 ENCOUNTER — Telehealth: Payer: Self-pay | Admitting: Medical

## 2021-10-07 NOTE — Telephone Encounter (Signed)
Transition Care Management Unsuccessful Follow-up Telephone Call  Date of discharge and from where:  10/06/2021 [facility]  Attempts:  1st Attempt  Reason for unsuccessful TCM follow-up call:  Left voice message    

## 2021-10-07 NOTE — Telephone Encounter (Signed)
Transition Care Management Follow-up Telephone Call Date of discharge and from where: 10/06/2021 [facility] How have you been since you were released from the hospital? No improvement  Any questions or concerns? Yes Pt still feels really bad and does not have an appt with GI until 10/20/2021 Items Reviewed: Did the pt receive and understand the discharge instructions provided? Yes  Medications obtained and verified? Yes  Other? No  Any new allergies since your discharge? No  Dietary orders reviewed? Yes Do you have support at home? Yes   Home Care and Equipment/Supplies: Were home health services ordered? not applicable   Follow up appointments reviewed:  PCP Hospital f/u appt confirmed? Yes  Scheduled to see JCL on 10/11/2021 @ 9:15. Specialist Hospital f/u appt confirmed? Yes  Scheduled to see [place] on 10/20/2021 @ 1:30. Are transportation arrangements needed? No  If their condition worsens, is the pt aware to call PCP or go to the Emergency Dept.? Yes Was the patient provided with contact information for the PCP's office or ED? Yes Was to pt encouraged to call back with questions or concerns? Yes

## 2021-10-11 ENCOUNTER — Ambulatory Visit (INDEPENDENT_AMBULATORY_CARE_PROVIDER_SITE_OTHER): Payer: 59 | Admitting: Family Medicine

## 2021-10-11 ENCOUNTER — Encounter: Payer: Self-pay | Admitting: Family Medicine

## 2021-10-11 VITALS — BP 110/72 | HR 78 | Temp 98.8°F | Wt 195.8 lb

## 2021-10-11 DIAGNOSIS — R7401 Elevation of levels of liver transaminase levels: Secondary | ICD-10-CM | POA: Diagnosis not present

## 2021-10-11 DIAGNOSIS — K760 Fatty (change of) liver, not elsewhere classified: Secondary | ICD-10-CM

## 2021-10-11 DIAGNOSIS — M25551 Pain in right hip: Secondary | ICD-10-CM

## 2021-10-11 DIAGNOSIS — L299 Pruritus, unspecified: Secondary | ICD-10-CM

## 2021-10-11 DIAGNOSIS — R112 Nausea with vomiting, unspecified: Secondary | ICD-10-CM | POA: Diagnosis not present

## 2021-10-11 NOTE — Progress Notes (Signed)
   Subjective:    Patient ID: Christine Schneider, female    DOB: 02/21/1990, 31 y.o.   MRN: [id number]  HPI She is here for consult concerning recent trip to the emergency room for evaluation of nausea and pruritus.  She has a very long and complicated history with liver problems including apparent liver failure with elevated enzymes, nonalcoholic fatty liver disease.  She has had biopsies of her liver that apparently were nondiagnostic.  She treating the with Zyrtec twice per day.  She has been seen in the past by gastroenterology and apparently has a scheduled appointment in the near future.  She also is having right hip pain and is apparently scheduled for an arthroplasty. Review of Systems     Objective:   Physical Exam Alert and in no distress.  The recent ER visit and previous notes including gastroenterology was evaluated.       Assessment & Plan:  Nausea and vomiting, unspecified vomiting type - Plan: Comprehensive metabolic panel, Amylase, Lipase  Pruritus  Elevated ALT measurement  Right hip pain  Fatty liver disease, nonalcoholic After a good discussion with her and reviewing her medical record, I recommend continue on Zyrtec but also recommend she take Pepcid double dosing to see if that will help with her pruritus.  She is to continue take Zofran to help with her nausea which seems to does benefit her.  We discussed the fact that this could be an autoimmune issue so far everything has been nondiagnostic.  Over 30 minutes spent discussing her care and evaluating the medical record.

## 2021-10-12 ENCOUNTER — Encounter: Payer: Self-pay | Admitting: Family Medicine

## 2021-10-12 ENCOUNTER — Telehealth: Payer: Self-pay | Admitting: Gastroenterology

## 2021-10-12 LAB — COMPREHENSIVE METABOLIC PANEL
ALT: 135 IU/L — ABNORMAL HIGH (ref 0–32)
AST: 51 IU/L — ABNORMAL HIGH (ref 0–40)
Albumin/Globulin Ratio: 1.5 (ref 1.2–2.2)
Albumin: 4.1 g/dL (ref 3.9–4.9)
Alkaline Phosphatase: 109 IU/L (ref 44–121)
BUN/Creatinine Ratio: 15 (ref 9–23)
BUN: 12 mg/dL (ref 6–20)
Bilirubin Total: 0.6 mg/dL (ref 0.0–1.2)
CO2: 24 mmol/L (ref 20–29)
Calcium: 8.9 mg/dL (ref 8.7–10.2)
Chloride: 102 mmol/L (ref 96–106)
Creatinine, Ser: 0.81 mg/dL (ref 0.57–1.00)
Globulin, Total: 2.7 g/dL (ref 1.5–4.5)
Glucose: 85 mg/dL (ref 70–99)
Potassium: 4.6 mmol/L (ref 3.5–5.2)
Sodium: 140 mmol/L (ref 134–144)
Total Protein: 6.8 g/dL (ref 6.0–8.5)
eGFR: 99 mL/min/{1.73_m2} (ref >59)

## 2021-10-12 LAB — LIPASE: Lipase: 35 U/L (ref 14–72)

## 2021-10-12 LAB — AMYLASE: Amylase: 37 U/L (ref 31–110)

## 2021-10-12 NOTE — Telephone Encounter (Signed)
Pt states last week she did not come for labs here, she went to the ER. She states she was itching and had such bad nausea she went to the ER. She had labs and had some abnormal lft's. Went to PCP yesterday and had more labs and LFT's were even higher. She reports she is nauseated all the time and is itching all over. She is taking zyrtec and has not had anything new environmentally or otherwise. Scheduled pt to see Dr. Tomasa Rand 10/15/21 at 3:40pm. Please advise of any further recommendations until OV on Friday.

## 2021-10-12 NOTE — Telephone Encounter (Signed)
Patient called states she has been feeling very nauseous and itchy. Requesting a call back as soon as possible. Please call to advise

## 2021-10-13 ENCOUNTER — Other Ambulatory Visit: Payer: Self-pay

## 2021-10-13 ENCOUNTER — Encounter: Payer: Self-pay | Admitting: Family Medicine

## 2021-10-13 MED ORDER — ONDANSETRON HCL 4 MG PO TABS: 4.0000 mg | ORAL_TABLET | Freq: Three times a day (TID) | ORAL | 1 refills | Status: DC | PRN

## 2021-10-13 NOTE — Telephone Encounter (Signed)
Pt notified via mychart. Prescription sent to pharmacy for zofran. Pt aware.

## 2021-10-15 ENCOUNTER — Ambulatory Visit (INDEPENDENT_AMBULATORY_CARE_PROVIDER_SITE_OTHER): Payer: 59 | Admitting: Gastroenterology

## 2021-10-15 ENCOUNTER — Other Ambulatory Visit (INDEPENDENT_AMBULATORY_CARE_PROVIDER_SITE_OTHER): Payer: 59

## 2021-10-15 ENCOUNTER — Encounter: Payer: Self-pay | Admitting: Gastroenterology

## 2021-10-15 VITALS — BP 118/80 | HR 75 | Ht 64.0 in | Wt 197.4 lb

## 2021-10-15 DIAGNOSIS — R110 Nausea: Secondary | ICD-10-CM | POA: Diagnosis not present

## 2021-10-15 DIAGNOSIS — R748 Abnormal levels of other serum enzymes: Secondary | ICD-10-CM

## 2021-10-15 DIAGNOSIS — L299 Pruritus, unspecified: Secondary | ICD-10-CM

## 2021-10-15 DIAGNOSIS — R5383 Other fatigue: Secondary | ICD-10-CM | POA: Diagnosis not present

## 2021-10-15 DIAGNOSIS — R1010 Upper abdominal pain, unspecified: Secondary | ICD-10-CM

## 2021-10-15 DIAGNOSIS — Z9049 Acquired absence of other specified parts of digestive tract: Secondary | ICD-10-CM

## 2021-10-15 LAB — CBC WITH DIFFERENTIAL/PLATELET
Basophils Absolute: 0 10*3/uL (ref 0.0–0.1)
Basophils Relative: 0.6 % (ref 0.0–3.0)
Eosinophils Absolute: 0.1 10*3/uL (ref 0.0–0.7)
Eosinophils Relative: 1.8 % (ref 0.0–5.0)
HCT: 40 % (ref 36.0–46.0)
Hemoglobin: 13.4 g/dL (ref 12.0–15.0)
Lymphocytes Relative: 28 % (ref 12.0–46.0)
Lymphs Abs: 1.4 10*3/uL (ref 0.7–4.0)
MCHC: 33.6 g/dL (ref 30.0–36.0)
MCV: 91.6 fl (ref 78.0–100.0)
Monocytes Absolute: 0.3 10*3/uL (ref 0.1–1.0)
Monocytes Relative: 6.6 % (ref 3.0–12.0)
Neutro Abs: 3.2 10*3/uL (ref 1.4–7.7)
Neutrophils Relative %: 63 % (ref 43.0–77.0)
Platelets: 333 10*3/uL (ref 150.0–400.0)
RBC: 4.37 Mil/uL (ref 3.87–5.11)
RDW: 13.3 % (ref 11.5–15.5)
WBC: 5.2 10*3/uL (ref 4.0–10.5)

## 2021-10-15 LAB — HEPATIC FUNCTION PANEL
ALT: 116 U/L — ABNORMAL HIGH (ref 0–35)
AST: 35 U/L (ref 0–37)
Albumin: 4 g/dL (ref 3.5–5.2)
Alkaline Phosphatase: 102 U/L (ref 39–117)
Bilirubin, Direct: 0.2 mg/dL (ref 0.0–0.3)
Total Bilirubin: 0.5 mg/dL (ref 0.2–1.2)
Total Protein: 7.6 g/dL (ref 6.0–8.3)

## 2021-10-15 LAB — IBC + FERRITIN
Ferritin: 12 ng/mL (ref 10.0–291.0)
Iron: 134 ug/dL (ref 42–145)
Saturation Ratios: 22.5 % (ref 20.0–50.0)
TIBC: 596.4 ug/dL — ABNORMAL HIGH (ref 250.0–450.0)
Transferrin: 426 mg/dL — ABNORMAL HIGH (ref 212.0–360.0)

## 2021-10-15 LAB — BASIC METABOLIC PANEL
BUN: 10 mg/dL (ref 6–23)
CO2: 31 mEq/L (ref 19–32)
Calcium: 9.5 mg/dL (ref 8.4–10.5)
Chloride: 99 mEq/L (ref 96–112)
Creatinine, Ser: 0.75 mg/dL (ref 0.40–1.20)
GFR: 105.93 mL/min (ref >60.00)
Glucose, Bld: 105 mg/dL — ABNORMAL HIGH (ref 70–99)
Potassium: 4.9 mEq/L (ref 3.5–5.1)
Sodium: 134 mEq/L — ABNORMAL LOW (ref 135–145)

## 2021-10-15 LAB — C-REACTIVE PROTEIN: CRP: <1 mg/dL (ref 0.5–20.0)

## 2021-10-15 LAB — GAMMA GT: GGT: 26 U/L (ref 7–51)

## 2021-10-15 MED ORDER — METOCLOPRAMIDE HCL 10 MG PO TABS
10.0000 mg | ORAL_TABLET | Freq: Four times a day (QID) | ORAL | 1 refills | Status: DC | PRN
Start: 2021-10-15 — End: 2022-02-21

## 2021-10-15 NOTE — Progress Notes (Signed)
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  Please see note from that time for detailed description of her presentation at that time.  Briefly, she experienced briefly markedly elevated liver enzymes (ALT > 1000) following her cholecystectomy in August 2022, followed by persistently mild liver related liver enzymes for several months afterwards.  In November 2022, due to persistently elevated liver enzymes and mildly elevated autoimmune markers, she underwent a liver biopsy.  Liver biopsy was essentially normal, with very mild inflammatory changes and no evidence of autoimmune hepatitis.  It was felt that her acute rise in enzymes may have been related to a common duct stone which passed spontaneously, versus drug-induced liver injury with slow resolution.  Her liver enzymes normalized completely and she felt well until August 28.  The evening of August 28 she felt overly full that night, and then awoke the next day feeling extremely nauseated and profoundly fatigued.  The nausea and the fatigue have persisted for these past 2 weeks.  She developed generalized pruritus on September 5, which prompted her to go to the emergency department for evaluation.  There she was found to have mild elevation of her liver enzymes in a hepatocellular pattern, with normal bilirubin.  Her liver enzymes were checked a few days later and were slightly increased. She thought she may be pregnant, as her pruritus was very similar to the pruritus she had during pregnancy.  She has had multiple negative pregnancy test and did have her period a few days ago.  She continues to feel nauseated all the time.  The nausea briefly improves when she eats, but then returns.  She has had no vomiting.   She has upper abdominal discomfort and tenderness, but no severe abdominal pain.  Her appetite has been okay.  She has gained a few pounds.  She has been taking Zofran for her nausea with minimal improvement.  She was prescribed Compazine by the emergency department, but she said that she did not like the way this medication made her feel.  Her bowel movements have been regular with no change.  No problems with constipation or diarrhea or blood in the stool. She denied any changes in her medications other than restarting Lamictal about 2 months ago.  She had been on Lamictal for a long time before stopping it when she became pregnant in 2022.  She takes a supplement for joint pain called resveratrol which she has been on for several years.  She has been on trazodone for several years. She used to take flecainide, but has not taken this in over a year, as it was felt that this may have been a cause of her elevated liver enzymes.  She thought that her nausea may be related to GERD, as she has had this in the past.  She started omeprazole and changed her diet (bland diet), but has not experienced any improvement.  She remains very concerned about the possibility of an underlying autoimmune disorder.  Past Medical History:  Diagnosis Date   Abrasion of hand 09/09/2013   bilateral   Anxiety    Asthma    Complication of anesthesia    hair loss after anesthesia   Depression    Dysrhythmia    svt  treated   Elevated ALT measurement 02/05/2018   Esophagitis  GERD (gastroesophageal reflux disease)    OTC as needed   Gestational diabetes    H/O hiatal hernia    High cholesterol    no current med.   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     Past Surgical History:  Procedure Laterality Date   ABLATION     HIP SURGERY Right 07/07/2011   2 surgeries   INTRAUTERINE DEVICE (IUD) INSERTION     ULNAR NERVE TRANSPOSITION Right 09/13/2013   Procedure: RIGHT ELBOW ULNAR NERVE RELEASE ANTERIOR TRANSPOSITION WITH REPAIR AND RECONSTRUCTION ;  Surgeon: Roseanne Kaufman, MD;  Location: [facility];  Service: Orthopedics;  Laterality: Right;   Family History  Problem Relation Age of Onset   Hyperlipidemia Mother    Hypertension Father    Depression Sister    Hypothyroidism Daughter        congenital   Breast cancer Paternal Aunt    Lymphoma Maternal Grandfather        B cell   Heart disease Maternal Grandfather    Heart attack Paternal Grandmother    Diabetes type I Paternal Grandfather    Esophageal cancer Other        mat great uncle, started in his mouth, smoker   Colon cancer Neg Hx    CVA Neg Hx    Social History   Tobacco Use   Smoking status: Never   Smokeless tobacco: Never  Vaping Use   Vaping Use: Never used  Substance Use Topics   Alcohol use: No   Drug use: No   Current Outpatient Medications  Medication Sig Dispense Refill   albuterol (VENTOLIN HFA) 108 (90 Base) MCG/ACT inhaler Inhale 2 puffs into the lungs every 6 (six) hours as needed for wheezing or shortness of breath. 18 g 11   amphetamine-dextroamphetamine (ADDERALL XR) 20 MG 24 hr capsule Take 20 mg by mouth daily.     chlorhexidine (HIBICLENS) 4 % external liquid Apply topically daily as needed. 473 mL 1   DULoxetine (CYMBALTA) 30 MG capsule Take 30 mg by mouth daily.     DULoxetine (CYMBALTA) 60 MG capsule Take 60 mg by mouth daily.     gabapentin (NEURONTIN) 300 MG capsule Take 300 mg by mouth daily.     hydrOXYzine (ATARAX) 25 MG  tablet Take 1 tablet (25 mg total) by mouth at bedtime. (Patient not taking: Reported on 10/11/2021) 12 tablet 0   lamoTRIgine (LAMICTAL) 100 MG tablet      levocetirizine (XYZAL) 5 MG tablet Take 1 tablet by mouth daily.     metoprolol succinate (TOPROL-XL) 25 MG 24 hr tablet Take 75 mg by mouth daily.     Multiple Vitamins-Minerals (MULTIVITAMIN WITH MINERALS) tablet Take 1 tablet by mouth daily.     mupirocin ointment (BACTROBAN) 2 % Apply 1 Application topically 2 (two) times daily. 30 g 1   norethindrone (MICRONOR) 0.35 MG tablet Take 1 tablet by mouth daily.     Omega-3 Fatty  Acids (FISH OIL PO) Take by mouth.     ondansetron (ZOFRAN) 4 MG tablet Take 1 tablet (4 mg total) by mouth every 8 (eight) hours as needed for nausea or vomiting. 30 tablet 1   prochlorperazine (COMPAZINE) 10 MG tablet Take 1 tablet (10 mg total) by mouth 2 (two) times daily as needed for nausea or vomiting. 10 tablet 0   RESVERATROL PO Take by mouth.     TRAZODONE HCL PO Take 50 mg by mouth at bedtime.     No current facility-administered medications for this visit.   Allergies  Allergen Reactions   Percocet [Oxycodone-Acetaminophen] Nausea And Vomiting   Hydromorphone Itching and Other (See Comments)    Dilaudid     Review of Systems: All systems reviewed and negative except where noted in HPI.    No results found.  Physical Exam: BP 118/80   Pulse 75   Ht 5\' 4"  (1.626 m)   Wt 197 lb 6.4 oz (89.5 kg)   SpO2 98%   BMI 33.88 kg/m  Constitutional: Pleasant,well-developed, Caucasian female in no acute distress.  Accompanied by young son HEENT: Normocephalic and atraumatic. Conjunctivae are normal. No scleral icterus. Neck supple.   Cardiovascular: Normal rate, regular rhythm.  Pulmonary/chest: Effort normal and breath sounds normal. No wheezing, rales or rhonchi. Abdominal: Soft, nondistended, tenderness to palpation in the epigastrium. Bowel sounds active throughout. There are no masses palpable.  No hepatomegaly. Extremities: no edema Lymphadenopathy: No cervical adenopathy noted. Neurological: Alert and oriented to person place and time. Skin: Skin is warm and dry. No rashes noted. Psychiatric: Normal mood and affect. Behavior is normal.  CBC    Component Value Date/Time   WBC 5.1 10/06/2021 1234   RBC 4.48 10/06/2021 1234   HGB 13.8 10/06/2021 1234   HGB 14.8 05/03/2021 1606   HCT 42.6 10/06/2021 1234   HCT 44.3 05/03/2021 1606   PLT 332 10/06/2021 1234   PLT 348 05/03/2021 1606   MCV 95.1 10/06/2021 1234   MCV 91 05/03/2021 1606   MCH 30.8 10/06/2021 1234   MCHC 32.4 10/06/2021 1234   RDW 12.6 10/06/2021 1234   RDW 11.8 05/03/2021 1606   LYMPHSABS 1.8 10/06/2021 1234   LYMPHSABS 1.8 05/03/2021 1606   MONOABS 0.4 10/06/2021 1234   EOSABS 0.1 10/06/2021 1234   EOSABS 0.2 05/03/2021 1606   BASOSABS 0.1 10/06/2021 1234   BASOSABS 0.0 05/03/2021 1606    CMP     Component Value Date/Time   NA 140 10/11/2021 1013   K 4.6 10/11/2021 1013   CL 102 10/11/2021 1013   CO2 24 10/11/2021 1013   GLUCOSE 85 10/11/2021 1013   GLUCOSE 95 10/06/2021 1234   BUN 12 10/11/2021 1013   CREATININE 0.81 10/11/2021 1013   CREATININE 0.40 (L) 06/14/2016 1456   CALCIUM 8.9 10/11/2021 1013   PROT 6.8 10/11/2021 1013   ALBUMIN 4.1 10/11/2021 1013   AST 51 (H) 10/11/2021 1013   ALT 135 (H) 10/11/2021 1013   ALKPHOS 109 10/11/2021 1013   BILITOT 0.6 10/11/2021 1013   GFRNONAA >60 10/06/2021 1234   GFRAA >60 10/18/2019 1203   Component 10 mo ago  SURGICAL PATHOLOGY SURGICAL PATHOLOGY  CASE: [id number]  PATIENT: Keelynn Dack  Surgical Pathology Report      Clinical History: Elevated LFT's, positive ANA; possible auto-immune  liver disease (crm)      FINAL MICROSCOPIC DIAGNOSIS:   A. LIVER, RIGHT LOBE, RANDOM, BIOPSY:  - Mild nonspecific changes including minimal to mild  portal  inflammation, and occasional ceroid-laden macrophages. See comment  - Negative for  pathologic fibrosis.        COMMENT:   The biopsy is close to normal with a few nonspecific findings. Portal  tracts are generally compact with few containing mild lymphocytic  inflammation. There is no interface hepatitis. A few portal-based ceroid  laden macrophages are also noted. Native bile ducts are unremarkable -  mild ductular reaction is seen. Trichrome and reticulin stains show that  portal tracts are compact, appropriately sized for the structures  contained within, and are negative for pathologic fibrosis. Hepatic  lobules show few glycogenated nuclei but do not show any steatosis,  cholestasis inflammation.  Iron stain is negative.  PASD stain shows  scattered ceroid-related macrophages within sinusoids and portal tracts.  The overall changes are minimal and nonspecific. Differential diagnosis  can include mild drug-induced injury, including patient's prescription  drugs, over-the-counter medications, and herbal supplements. Also, some  systemic illnesses can cause non-specific reactive hepatitis.  Patient's  mildly elevated ANA and ASMA titers are noted but histologic features of  autoimmune hepatitis are not present in the submitted biopsies.       GROSS DESCRIPTION:   A.  Received fresh and subsequently placed in formalin labeled with the  patient's name and "Liver" are five red-tan cores of soft tissue ranging  from 0.3 to 2.5 cm in length and each 0.1 cm in diameter.  Submitted in  toto in a single cassette.   (LEF 12/14/2020)     Final Diagnosis performed by Holley Bouche, MD.   Electronically  signed 12/16/2020  Technical and / or Professional components performed at [facility], [address] [place], [place] [address].   Immunohistochemistry Technical component (if applicable) was performed  at [facility]. [address], [address],  [place], [place] [address].   IMMUNOHISTOCHEMISTRY DISCLAIMER (if  applicable):  Some of these immunohistochemical stains may have been developed and the  performance characteristics determine by [facility]. Some  may not have been cleared or approved by the U.S. Food and Drug  Administration. The FDA has determined that such clearance or approval  is not necessary. This test is used for clinical purposes. It should not  be regarded as investigational or for research. This laboratory is  certified under the Clinical Laboratory Improvement Amendments of 1988  (CLIA-88) as qualified to perform high complexity clinical laboratory  testing.  The controls stained appropriately.    Component Ref Range & Units 3 mo ago (07/07/21) 5 mo ago (05/03/21) 10 mo ago (12/09/20) 11 mo ago (11/06/20) 1 yr ago (09/14/20) 1 yr ago (07/27/20) 1 yr ago (10/19/19)  Total Bilirubin 0.2 - 1.2 mg/dL 0.3  <5.8 R  0.4  0.3  0.4 R  0.6 R  0.5 R   Bilirubin, Direct 0.0 - 0.3 mg/dL 0.1   0.1  0.1    <5.2 R   Alkaline Phosphatase 39 - 117 U/L 63  100 R  124 High   135 High   198 High  R  171 High  R  74 R   AST 0 - 37 U/L 22  29 R  40 High   30  32 R  84 High  R  30 R   ALT 0 - 35 U/L 32  87 High  R  114 High   87 High   100 High  R  99 High  R  61 High  R   Total Protein 6.0 - 8.3 g/dL 7.4  6.9 R  7.8  7.7  7.5 R  7.7 R  7.5 R   Albumin 3.5 - 5.2 g/dL 4.5  4.7 R  4.7  4.6  4.8 R      Component Ref Range & Units 9 d ago 1 yr ago  I-stat hCG, quantitative <5 mIU/mL <5.0  <5.0    Component Ref Range & Units 9 d ago (10/06/21) 2 yr ago (09/26/19) 3 yr ago (06/15/18) 3 yr ago (02/12/18) 4 yr ago (08/28/17) 4 yr ago (03/08/17)  Color, Urine YELLOW YELLOW        APPearance CLEAR CLEAR        Specific Gravity, Urine 1.005 - 1.030 1.014  1.030 R  1.015 R  1.020 R  1.025 R  1.025 R   pH 5.0 - 8.0 6.0        Glucose, UA NEGATIVE mg/dL NEGATIVE        Hgb urine dipstick NEGATIVE NEGATIVE        Bilirubin Urine NEGATIVE NEGATIVE        Ketones, ur NEGATIVE mg/dL NEGATIVE         Protein, ur NEGATIVE mg/dL NEGATIVE        Nitrite NEGATIVE NEGATIVE        Leukocytes,Ua NEGATIVE NEGATIVE         Component Ref Range & Units 11 mo ago  IgG, Subclass 4 2 - 96 mg/dL 133 High     Component Ref Range & Units 11 mo ago  ANA Titer 1 titer 1:80 High     Component Ref Range & Units 11 mo ago  Actin (Smooth Muscle) Antibody (IGG) <20 U <20     Ref Range & Units 1 yr ago  Sodium 135 - 146 MMOL/L 139   Potassium 3.5 - 5.3 MMOL/L 4.6   Chloride 98 - 110 MMOL/L 101   CO2 23 - 30 MMOL/L 31 High    BUN 8 - 24 MG/DL 14   Glucose 70 - 99 MG/DL 102 High    Comment: Patients taking eltrombopag at doses >/= 100 mg daily may show falsely elevated values of 10% or greater.  Creatinine 0.50 - 1.50 MG/DL 0.89   Calcium 8.5 - 10.5 MG/DL 9.9   Total Protein 6.0 - 8.3 G/DL 7.7   Comment: Patients taking eltrombopag at doses >/= 100 mg daily may show falsely elevated values of 10% or greater.  Albumin  3.5 - 5.0 G/DL 4.8   Total Bilirubin 0.1 - 1.2 MG/DL 1.6 High    Comment: Patients taking eltrombopag at doses >/= 100 mg daily may show falsely elevated values of 10% or greater.  Alkaline Phosphatase 25 - 125 IU/L or U/L 392 High    AST (SGOT) 5 - 40 IU/L or U/L 525 High    ALT (SGPT) 5 - 50 IU/L or U/L 1,128 High    Anion Gap 4 - 14 MMOL/L 7   Est. GFR >=60 ML/MIN/1.73 M*2  ML/MIN/1.73 M*2 90   Comment: GFR estimated by CKD-EPI equations(NKF 2021).   ASSESSMENT AND PLAN:  31 year old female with reported history of intrahepatic cholestasis of pregnancy in 2022, followed by symptomatic cholelithiasis, who experienced severely elevated liver enzymes following her cholecystectomy, with persistently mildly elevated liver enzymes for several months, with modestly elevated IgG4 and ANA titers, concerning for possible underlying autoimmune liver disease.  Liver biopsy showed no evidence of autoimmune liver disease and her liver  enzymes normalized. She remained asymptomatic until 2 weeks  ago when she developed nausea, abdominal discomfort and generalized pruritus.  Liver enzymes are once again elevated, although modestly so with completely normal bilirubin.  It is unclear that her pruritus is related to her liver.  Although her symptoms are similar to her pruritus during pregnancy, she is definitively not pregnant, as evidenced by multiple negative pregnancy test and recent menstruation.  I am not aware of any cases of intrahepatic cholestasis of pregnancy occurring in the absence of pregnancy.  With the bilirubin being completely normal, it seems very unlikely that the 2 are related.  The elevated liver enzymes may be related to medication effect, but given her history of persistent liver enzyme elevation and elevated autoimmune markers, autoimmune etiologies need to be evaluated further.  We will recheck ANA, ASMA and IgG for.  We will get a CRP.  Although we discussed how extremely rare sphincter of Oddi syndrome is, given her abdominal pain, nausea and elevated liver enzymes following cholecystectomy, I think it is reasonable to evaluate further for this.  We will get a HIDA scan to assess for any abnormalities of biliary outflow.  Given that her nausea is continuous, not reliably associated with meals or eating, I am still suspicious that there may be medication side effect, although she has not started any new medications and it is difficult to advise her on which medication to hold at this time.  We will also check for H. pylori stool antigen which can cause chronic nausea.  We also discussed performing upper endoscopy, but decided to hold off on this for now, until the liver was more fully investigated.  I recommended she try Reglan for short period of time to see if this helps her nausea.  She is aware of the potential neurologic side effects that can occur with chronic use, and was instructed to stop the Reglan and if it was not very effective for her or if she started to have  any noticeable neurologic side effects.  Given her profound fatigue and elevated liver enzymes, will also rule out mono as a potential underlying cause, although she does not have other infectious symptoms and no lymphadenopathy.  We also discussed potentially starting medications that would be used for hepatic pruritus such as cholestyramine or ursodiol.  Patient took ursodiol during pregnancy tolerated well.  She preferred to hold off on starting this medication until it was more clear that her pruritus was hepatic in etiology.  Nausea/Abdominal discomfort following cholecystectomy - HIDA scan - Trial of Reglan 10 mg every 6 hours as needed - Reconsider upper endoscopy if no improvement - Continue omeprazole  Pruritis -Hold off on ursodiol for now, continue as needed antihistamine  Elevated aminotransferases, history of elevated IgG4/ANA - Recheck LAEs, GGT - recheck autoimmune serologies, iron panel, CRP  Profound fatigue - Monospot  Kamoria Lucien E. Candis Schatz, MD [facility]   CC:  Irene Pap, PA-C

## 2021-10-15 NOTE — Patient Instructions (Signed)
If you are age 31 or older, your body mass index should be between 23-30. Your Body mass index is 33.88 kg/m. If this is out of the aforementioned range listed, please consider follow up with your Primary Care Provider.  If you are age 44 or younger, your body mass index should be between 19-25. Your Body mass index is 33.88 kg/m. If this is out of the aformentioned range listed, please consider follow up with your Primary Care Provider.   We have sent the following medications to your pharmacy for you to pick up at your convenience: Reglan 10 mg every 6 hours as needed for nausea.    Your provider has requested that you go to the basement level for lab work before leaving today. Press "B" on the elevator. The lab is located at the first door on the left as you exit the elevator.  You have been scheduled for a HIDA scan at [facility] (1st floor) on 10/29/21. Please arrive 15 minutes prior to your scheduled appointment at  9:62IW. Make certain not to have anything to eat or drink at least 6 hours prior to your test. Should this appointment date or time not work well for you, please call radiology scheduling at [phone].  _____________________________________________________________________ hepatobiliary (HIDA) scan is an imaging procedure used to diagnose problems in the liver, gallbladder and bile ducts. In the HIDA scan, a radioactive chemical or tracer is injected into a vein in your arm. The tracer is handled by the liver like bile. Bile is a fluid produced and excreted by your liver that helps your digestive system break down fats in the foods you eat. Bile is stored in your gallbladder and the gallbladder releases the bile when you eat a meal. A special nuclear medicine scanner (gamma camera) tracks the flow of the tracer from your liver into your gallbladder and small intestine.  During your HIDA scan  You'll be asked to change into a hospital gown before your HIDA scan begins.  Your health care team will position you on a table, usually on your back. The radioactive tracer is then injected into a vein in your arm.The tracer travels through your bloodstream to your liver, where it's taken up by the bile-producing cells. The radioactive tracer travels with the bile from your liver into your gallbladder and through your bile ducts to your small intestine.You may feel some pressure while the radioactive tracer is injected into your vein. As you lie on the table, a special gamma camera is positioned over your abdomen taking pictures of the tracer as it moves through your body. The gamma camera takes pictures continually for about an hour. You'll need to keep still during the HIDA scan. This can become uncomfortable, but you may find that you can lessen the discomfort by taking deep breaths and thinking about other things. Tell your health care team if you're uncomfortable. The radiologist will watch on a computer the progress of the radioactive tracer through your body. The HIDA scan may be stopped when the radioactive tracer is seen in the gallbladder and enters your small intestine. This typically takes about an hour. In some cases extra imaging will be performed if original images aren't satisfactory, if morphine is given to help visualize the gallbladder or if the medication CCK is given to look at the contraction of the gallbladder. This test typically takes 2 hours to complete.  Due to recent changes in healthcare laws, you may see the results of your  imaging and laboratory studies on MyChart before your provider has had a chance to review them.  We understand that in some cases there may be results that are confusing or concerning to you. Not all laboratory results come back in the same time frame and the provider may be waiting for multiple results in order to interpret others.  Please give [place] 48 hours in order for your provider to thoroughly review all the results before contacting  the office for clarification of your results.     The [facility] providers would like to encourage you to use [facility] to communicate with providers for non-urgent requests or questions.  Due to long hold times on the telephone, sending your provider a message by [facility] may be a faster and more efficient way to get a response.  Please allow 48 business hours for a response.  Please remember that this is for non-urgent requests.   It was a pleasure to see you today!  Thank you for trusting me with your gastrointestinal care!

## 2021-10-18 ENCOUNTER — Other Ambulatory Visit: Payer: 59

## 2021-10-18 ENCOUNTER — Encounter: Payer: Self-pay | Admitting: Gastroenterology

## 2021-10-18 DIAGNOSIS — R748 Abnormal levels of other serum enzymes: Secondary | ICD-10-CM

## 2021-10-18 LAB — MONONUCLEOSIS SCREEN: Mono Screen: NEGATIVE

## 2021-10-19 ENCOUNTER — Encounter: Payer: Self-pay | Admitting: Gastroenterology

## 2021-10-19 LAB — ANTI-NUCLEAR AB-TITER (ANA TITER): ANA Titer 1: 1:80 {titer} — ABNORMAL HIGH

## 2021-10-19 LAB — TISSUE TRANSGLUTAMINASE, IGA: (tTG) Ab, IgA: <1 U/mL

## 2021-10-19 LAB — ANA: Anti Nuclear Antibody (ANA): POSITIVE — AB

## 2021-10-19 LAB — ANTI-SMOOTH MUSCLE ANTIBODY, IGG: Actin (Smooth Muscle) Antibody (IGG): <20 U (ref <20)

## 2021-10-19 LAB — IGG: IgG (Immunoglobin G), Serum: 1101 mg/dL (ref 600–1640)

## 2021-10-20 ENCOUNTER — Ambulatory Visit: Payer: 59 | Admitting: Physician Assistant

## 2021-10-20 LAB — H. PYLORI ANTIGEN, STOOL: H pylori Ag, Stl: NEGATIVE

## 2021-10-20 LAB — IGG 4: IgG, Subclass 4: 101 mg/dL — ABNORMAL HIGH (ref 2–96)

## 2021-10-22 ENCOUNTER — Encounter: Payer: Self-pay | Admitting: Gastroenterology

## 2021-10-22 DIAGNOSIS — R748 Abnormal levels of other serum enzymes: Secondary | ICD-10-CM

## 2021-10-22 DIAGNOSIS — Z9049 Acquired absence of other specified parts of digestive tract: Secondary | ICD-10-CM

## 2021-10-22 DIAGNOSIS — R110 Nausea: Secondary | ICD-10-CM

## 2021-10-22 DIAGNOSIS — R5383 Other fatigue: Secondary | ICD-10-CM

## 2021-10-22 DIAGNOSIS — L299 Pruritus, unspecified: Secondary | ICD-10-CM

## 2021-10-22 NOTE — Progress Notes (Signed)
Christine Schneider, Overall your labs looked good and unchanged or improving from previous.  Your IgG4 antibodies were only very slightly above the upper range of normal, and is likely not significant.  Your ANA is persistently positive at a low titer (1: 80).  This is fairly common and of likely little significance for your GI symptoms and elevated liver enzymes.  If you are having symptoms of joint pains and persistent rashes, we could refer you to a rheumatologist as the ANA being be related to those symptoms. Your liver enzymes are improving.  Repeat test for autoimmune hepatitis were negative. Testing for mono was negative. C-reactive protein (an inflammatory marker) was normal.  How is the rash doing, and how is your nausea?

## 2021-10-27 ENCOUNTER — Encounter: Payer: Self-pay | Admitting: Gastroenterology

## 2021-10-28 ENCOUNTER — Other Ambulatory Visit (INDEPENDENT_AMBULATORY_CARE_PROVIDER_SITE_OTHER): Payer: 59

## 2021-10-28 DIAGNOSIS — R748 Abnormal levels of other serum enzymes: Secondary | ICD-10-CM

## 2021-10-28 LAB — HEPATIC FUNCTION PANEL
ALT: 67 U/L — ABNORMAL HIGH (ref 0–35)
AST: 31 U/L (ref 0–37)
Albumin: 4.2 g/dL (ref 3.5–5.2)
Alkaline Phosphatase: 121 U/L — ABNORMAL HIGH (ref 39–117)
Bilirubin, Direct: 0.1 mg/dL (ref 0.0–0.3)
Total Bilirubin: 0.3 mg/dL (ref 0.2–1.2)
Total Protein: 7.4 g/dL (ref 6.0–8.3)

## 2021-10-29 ENCOUNTER — Other Ambulatory Visit: Payer: Self-pay

## 2021-10-29 ENCOUNTER — Ambulatory Visit (HOSPITAL_COMMUNITY)
Admission: RE | Admit: 2021-10-29 | Discharge: 2021-10-29 | Disposition: A | Discharge status: Home / Self Care | Payer: 59 | Source: Ambulatory Visit | Attending: Gastroenterology | Admitting: Gastroenterology

## 2021-10-29 DIAGNOSIS — R768 Other specified abnormal immunological findings in serum: Secondary | ICD-10-CM

## 2021-10-29 DIAGNOSIS — R748 Abnormal levels of other serum enzymes: Secondary | ICD-10-CM | POA: Insufficient documentation

## 2021-10-29 DIAGNOSIS — R21 Rash and other nonspecific skin eruption: Secondary | ICD-10-CM

## 2021-10-29 DIAGNOSIS — G8929 Other chronic pain: Secondary | ICD-10-CM

## 2021-10-29 MED ORDER — TECHNETIUM TC 99M MEBROFENIN IV KIT
5.2000 | PACK | Freq: Once | INTRAVENOUS | Status: AC | PRN
  Administered 2021-10-29: 5.2 via INTRAVENOUS

## 2021-11-01 ENCOUNTER — Encounter: Payer: Self-pay | Admitting: Gastroenterology

## 2021-11-01 ENCOUNTER — Other Ambulatory Visit: Payer: Self-pay

## 2021-11-01 DIAGNOSIS — R748 Abnormal levels of other serum enzymes: Secondary | ICD-10-CM

## 2021-11-01 NOTE — Progress Notes (Signed)
Christine Schneider,  Your liver enzymes are continuing to downtrend.  I expect they will return to normal within a few more weeks. Let's recheck them again in 2 months.

## 2021-11-02 NOTE — Progress Notes (Signed)
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  Although this is a poorly understood diagnosis, bile reflux has been associated with multiple GI symptoms to include nausea and abdominal pain.  There is no clear treatment for bile reflux, but multiple drugs have been studied with mixed success. One drug that has been studied is ursodiol.  This is a medication that is used to prevent gallstones and for gallstone dissolution, but is also used in some intrinsic liver diseases.  It was compared to PPIs in a recent trial and was superior for symptom control. I think trying a low dose of ursodiol may be worth it to see if your symptoms improve.  I would start out at 300 mg PO twice daily, and then consider increasing the dose if you are tolerating it well.  It is generally well tolerated, but sometimes cause itching and nausea (which you are already having). Let me know what you think.

## 2021-11-04 ENCOUNTER — Other Ambulatory Visit: Payer: Self-pay

## 2021-11-04 ENCOUNTER — Encounter: Payer: Self-pay | Admitting: Gastroenterology

## 2021-11-04 MED ORDER — URSODIOL 300 MG PO CAPS: 300.0000 mg | ORAL_CAPSULE | Freq: Two times a day (BID) | ORAL | 3 refills | Status: DC

## 2021-12-14 ENCOUNTER — Encounter: Payer: Self-pay | Admitting: Internal Medicine

## 2022-01-10 ENCOUNTER — Telehealth: Payer: Self-pay | Admitting: Neurology

## 2022-01-10 NOTE — Telephone Encounter (Signed)
LVM and sent mychart msg informing pt of need to reschedule 02/16/22 appointment- MD out 

## 2022-01-20 ENCOUNTER — Other Ambulatory Visit (INDEPENDENT_AMBULATORY_CARE_PROVIDER_SITE_OTHER): Payer: 59

## 2022-01-20 DIAGNOSIS — R748 Abnormal levels of other serum enzymes: Secondary | ICD-10-CM | POA: Diagnosis not present

## 2022-01-20 LAB — HEPATIC FUNCTION PANEL
ALT: 20 U/L (ref 0–35)
AST: 13 U/L (ref 0–37)
Albumin: 4.3 g/dL (ref 3.5–5.2)
Alkaline Phosphatase: 77 U/L (ref 39–117)
Bilirubin, Direct: 0.1 mg/dL (ref 0.0–0.3)
Total Bilirubin: 0.3 mg/dL (ref 0.2–1.2)
Total Protein: 7.3 g/dL (ref 6.0–8.3)

## 2022-01-26 NOTE — Progress Notes (Signed)
[name],  Your liver enzymes have returned to normal.  How have your symptoms been recently?

## 2022-02-16 ENCOUNTER — Ambulatory Visit: Payer: 59 | Admitting: Neurology

## 2022-02-21 ENCOUNTER — Ambulatory Visit (INDEPENDENT_AMBULATORY_CARE_PROVIDER_SITE_OTHER): Payer: 59 | Admitting: Nurse Practitioner

## 2022-02-21 ENCOUNTER — Encounter: Payer: Self-pay | Admitting: Nurse Practitioner

## 2022-02-21 VITALS — BP 122/76 | HR 80 | Temp 98.3°F | Wt 203.2 lb

## 2022-02-21 DIAGNOSIS — Z139 Encounter for screening, unspecified: Secondary | ICD-10-CM | POA: Insufficient documentation

## 2022-02-21 DIAGNOSIS — Z8719 Personal history of other diseases of the digestive system: Secondary | ICD-10-CM | POA: Insufficient documentation

## 2022-02-21 DIAGNOSIS — Z8742 Personal history of other diseases of the female genital tract: Secondary | ICD-10-CM | POA: Insufficient documentation

## 2022-02-21 DIAGNOSIS — F3131 Bipolar disorder, current episode depressed, mild: Secondary | ICD-10-CM

## 2022-02-21 HISTORY — DX: Personal history of other diseases of the female genital tract: Z87.42

## 2022-02-21 HISTORY — DX: Encounter for screening, unspecified: Z13.9

## 2022-02-21 NOTE — Progress Notes (Signed)
Christine Keeler, DNP, AGNP-c [facility]  [address] [place], [place] [address] [place] and/or Follow-Up Visit  Blood pressure 122/76, pulse 80, temperature 98.3 F (36.8 C), weight 203 lb 3.2 oz (92.2 kg).    Christine Schneider is a 32 y.o. year old female presenting today for evaluation and management of the following: Pre-Op examination Christine Schneider is here for examination prior to upcoming surgery for right hip replacement on 03/22/2022. She has had two prior procedures to try to save the hip, but it has deteriorated to the point that surgery is the only option.  Christine Schneider is a new patient to me today. Chart review shows fairly extensive history of cardiac concerns, but these are all well controlled at this time per the patient. She has undergone ablation for atrial flutter and is on metoprolol for SVT. She tells me aside from her hip she overall feels good.   She is currently taking Gabapentin for anxiety and pain and this is well tolerated. Trazodone helping with sleep. Duloxetine is controlling mood very well. She is seeing psychiatry every other month and tells me she is doing well with her mood management.  She denies any concerns specifically today or any questions.   All ROS negative with exception of what is listed above.   PHYSICAL EXAM Physical Exam Vitals and nursing note reviewed.  Constitutional:      General: She is not in acute distress.    Appearance: Normal appearance.  HENT:     Head: Normocephalic.     Right Ear: Tympanic membrane normal.     Left Ear: Tympanic membrane normal.     Nose: Nose normal.  Eyes:     Extraocular Movements: Extraocular movements intact.     Conjunctiva/sclera: Conjunctivae normal.     Pupils: Pupils are equal, round, and reactive to light.  Neck:     Vascular: No carotid bruit.  Cardiovascular:     Rate and Rhythm: Normal rate and regular rhythm.     Pulses: Normal  pulses.     Heart sounds: Normal heart sounds. No murmur heard. Pulmonary:     Effort: Pulmonary effort is normal.     Breath sounds: Normal breath sounds. No wheezing.  Abdominal:     General: Bowel sounds are normal. There is no distension.     Palpations: Abdomen is soft.     Tenderness: There is no abdominal tenderness. There is no guarding.  Musculoskeletal:        General: Normal range of motion.     Cervical back: Normal range of motion and neck supple.     Right lower leg: No edema.     Left lower leg: No edema.  Lymphadenopathy:     Cervical: No cervical adenopathy.  Skin:    General: Skin is warm and dry.     Capillary Refill: Capillary refill takes less than 2 seconds.  Neurological:     General: No focal deficit present.     Mental Status: She is alert and oriented to person, place, and time.     Sensory: No sensory deficit.     Motor: No weakness.  Psychiatric:        Mood and Affect: Mood normal.        Behavior: Behavior normal.        Thought Content: Thought content normal.        Judgment: Judgment normal.     PLAN Problem List Items Addressed This Visit  Bipolar I disorder, most recent episode depressed, mild (HCC)    Chronic. Well controlled at this time with no current symptoms present. She is followed closely with psychiatry for management. Recommend continue of medications prior to surgery for optimal control and prevention of mood changes.       Encounter for health-related screening - Primary    Health examination for pre-operative clearance today. Exam is normal with no concerning symptoms present. She is currently stable on her medications. Cardiology will be evaluating next week and signing off for her. Labs will be completed by the surgical team. At this time I do not find any concerning reason that she may not proceed with surgery. There are no medications, from my standpoint, that she should hold prior to procedure. Clearance signed and faxed  to [facility].        Return if symptoms worsen or fail to improve.   Christine Keeler, DNP, AGNP-c

## 2022-02-21 NOTE — Assessment & Plan Note (Signed)
Chronic. Well controlled at this time with no current symptoms present. She is followed closely with psychiatry for management. Recommend continue of medications prior to surgery for optimal control and prevention of mood changes.

## 2022-02-21 NOTE — Assessment & Plan Note (Signed)
Health examination for pre-operative clearance today. Exam is normal with no concerning symptoms present. She is currently stable on her medications. Cardiology will be evaluating next week and signing off for her. Labs will be completed by the surgical team. At this time I do not find any concerning reason that she may not proceed with surgery. There are no medications, from my standpoint, that she should hold prior to procedure. Clearance signed and faxed to [facility].

## 2022-02-21 NOTE — Patient Instructions (Addendum)
It was a pleasure seeing you today! Thank you for trusting me with your care.   Recommendations from today:  I would recommend 325mg  ferrous sulfate twice a week to help bring up your iron levels prior to surgery.  This will help prevent a dip in your hemoglobin during surgery.  Otherwise, everything looks very good! Good luck on your surgery.   If labs were collected today, you will see the results as soon as they become available in [place]. I will review these labs once I have received all of the results and send you comments and recommendations, if any. If you have specific concerns, we can set up an appointment (virtual or in person) to go over details and come up with a plan together.   If you have received any referrals today, the office where the referral was made will be in contact with you to set up your appointment.   If you have received orders for imaging today, the imaging office will contact you to schedule this. Please note that some imaging requires a prior authorization from insurance, so an order is not a guarantee this will be covered by your insurance, but I want to assure you we will do our best to get this covered and if it is not, you will be notified and we can come up with an alternative plan.   If you take regular prescription medications, please contact your pharmacy for routine refill requests. They will send this directly to [place].  If you were ordered new medication as a part of your examination and treatment today, please contact your pharmacy to determine the status. Many prescriptions require a prior authorization and the pharmacy will contact us to get this started. This may take a few days for approval or denial. If the medication is denied, we will work with you to try alternative medications.   If you have any questions or concerns, please do not hesitate to contact the office via telephone or [place].  MyChart messages are received by the [place] staff during regular  business hours Monday through Friday and we do our best to respond in a timely manner.  If your request requires an appointment, the staff will gladly help set that up so that we have the time dedicated to ensure that your questions are appropriately answered.

## 2022-03-14 NOTE — Patient Instructions (Signed)
SURGICAL WAITING ROOM VISITATION  Patients having surgery or a procedure may have no more than 2 support people in the waiting area - these visitors may rotate.    Children under the age of 51 must have an adult with them who is not the patient.  Due to an increase in RSV and influenza rates and associated hospitalizations, children ages 53 and under may not visit patients in [place].  If the patient needs to stay at the hospital during part of their recovery, the visitor guidelines for inpatient rooms apply. Pre-op nurse will coordinate an appropriate time for 1 support person to accompany patient in pre-op.  This support person may not rotate.    Please refer to the [facility] website for the visitor guidelines for Inpatients (after your surgery is over and you are in a regular room).       Your procedure is scheduled on:  03/22/2022    Report to [facility] Main Entrance    Report to admitting at  1200 noon    Call this number if you have problems the morning of surgery [phone]   Do not eat food :After Midnight.   After Midnight you may have the following liquids until _ 1115_____ AM DAY OF SURGERY  Water Non-Citrus Juices (without pulp, NO RED-Apple, White grape, White cranberry) Black Coffee (NO MILK/CREAM OR CREAMERS, sugar ok)  Clear Tea (NO MILK/CREAM OR CREAMERS, sugar ok) regular and decaf                             Plain Jell-O (NO RED)                                           Fruit ices (not with fruit pulp, NO RED)                                     Popsicles (NO RED)                                                               Sports drinks like Gatorade (NO RED)                    The day of surgery:  Drink ONE (1) Pre-Surgery Clear Ensure or G2 at    1115AM ( have completed by )  the morning of surgery. Drink in one sitting. Do not sip.  This drink was given to you during your hospital  pre-op appointment visit. Nothing else  to drink after completing the  Pre-Surgery Clear Ensure or G2.          If you have questions, please contact your surgeon's office.        Oral Hygiene is also important to reduce your risk of infection.                                    Remember - BRUSH YOUR TEETH THE MORNING  OF SURGERY WITH YOUR REGULAR TOOTHPASTE  DENTURES WILL BE REMOVED PRIOR TO SURGERY PLEASE DO NOT APPLY "Poly grip" OR ADHESIVES!!!   Do NOT smoke after Midnight   Take these medicines the morning of surgery with A SIP OF WATER:  inhalers as  usual and bring, cymbalta, gabapentin, toprol   DO NOT TAKE ANY ORAL DIABETIC MEDICATIONS DAY OF YOUR SURGERY  Bring CPAP mask and tubing day of surgery.                              You may not have any metal on your body including hair pins, jewelry, and body piercing             Do not wear make-up, lotions, powders, perfumes/cologne, or deodorant  Do not wear nail polish including gel and S&S, artificial/acrylic nails, or any other type of covering on natural nails including finger and toenails. If you have artificial nails, gel coating, etc. that needs to be removed by a nail salon please have this removed prior to surgery or surgery may need to be canceled/ delayed if the surgeon/ anesthesia feels like they are unable to be safely monitored.   Do not shave  48 hours prior to surgery.               Men may shave face and neck.   Do not bring valuables to the hospital. [place].   Contacts, glasses, dentures or bridgework may not be worn into surgery.   Bring small overnight bag day of surgery.   DO NOT [place]. PHARMACY WILL DISPENSE MEDICATIONS LISTED ON YOUR MEDICATION LIST TO YOU DURING YOUR ADMISSION [place]!    Patients discharged on the day of surgery will not be allowed to drive home.  Someone NEEDS to stay with you for the first 24 hours after  anesthesia.   Special Instructions: Bring a copy of your healthcare power of attorney and living will documents the day of surgery if you haven't scanned them before.              Please read over the following fact sheets you were given: IF [place] [phone]   If you received a COVID test during your pre-op visit  it is requested that you wear a mask when out in public, stay away from anyone that may not be feeling well and notify your surgeon if you develop symptoms. If you test positive for Covid or have been in contact with anyone that has tested positive in the last 10 days please notify you surgeon.    [facility] - Preparing for Surgery Before surgery, you can play an important role.  Because skin is not sterile, your skin needs to be as free of germs as possible.  You can reduce the number of germs on your skin by washing with CHG (chlorahexidine gluconate) soap before surgery.  CHG is an antiseptic cleaner which kills germs and bonds with the skin to continue killing germs even after washing. Please DO NOT use if you have an allergy to CHG or antibacterial soaps.  If your skin becomes reddened/irritated stop using the CHG and inform your nurse when you arrive at Short Stay. Do not shave (including legs and underarms)  for at least 48 hours prior to the first CHG shower.  You may shave your face/neck. Please follow these instructions carefully:  1.  Shower with CHG Soap the night before surgery and the  morning of Surgery.  2.  If you choose to wash your hair, wash your hair first as usual with your  normal  shampoo.  3.  After you shampoo, rinse your hair and body thoroughly to remove the  shampoo.                           4.  Use CHG as you would any other liquid soap.  You can apply chg directly  to the skin and wash                       Gently with a scrungie or clean washcloth.  5.  Apply the CHG Soap to your body ONLY FROM THE  NECK DOWN.   Do not use on face/ open                           Wound or open sores. Avoid contact with eyes, ears mouth and genitals (private parts).                       Wash face,  Genitals (private parts) with your normal soap.             6.  Wash thoroughly, paying special attention to the area where your surgery  will be performed.  7.  Thoroughly rinse your body with warm water from the neck down.  8.  DO NOT shower/wash with your normal soap after using and rinsing off  the CHG Soap.                9.  Pat yourself dry with a clean towel.            10.  Wear clean pajamas.            11.  Place clean sheets on your bed the night of your first shower and do not  sleep with pets. Day of Surgery : Do not apply any lotions/deodorants the morning of surgery.  Please wear clean clothes to the hospital/surgery center.  FAILURE TO FOLLOW THESE INSTRUCTIONS MAY RESULT IN THE CANCELLATION OF YOUR SURGERY PATIENT SIGNATURE_________________________________  NURSE SIGNATURE__________________________________  ________________________________________________________________________

## 2022-03-14 NOTE — Progress Notes (Signed)
Anesthesia Review:  PCP: Jacolyn Reedy, NP LOv 02/21/22 Clearance on chart dated 02/21/22 Cardiologist : Minna Merritts LOV 11/02/21 - Clearance on chart dated 03/14/22- by Demetrius Charity at [facility] to fax over pcp and cardiac clearance. Requested on 03/15/22.  Chest x-ray : EKG : 03/15/22  Echo : Stress test: Cardiac Cath :  Activity level: can do a flight of stairs without difficulty  Sleep Study/ CPAP : none  Fasting Blood Sugar :      / Checks Blood Sugar -- times a day:   Blood Thinner/ Instructions /Last Dose: ASA / Instructions/ Last Dose :   Gestational diabetes- no meds in 20 months.    Hgba1c-03/15/22- 5.3

## 2022-03-15 ENCOUNTER — Encounter (HOSPITAL_COMMUNITY)
Admission: RE | Admit: 2022-03-15 | Discharge: 2022-03-15 | Disposition: A | Discharge status: Home / Self Care | Payer: 59 | Source: Ambulatory Visit | Attending: Orthopedic Surgery | Admitting: Orthopedic Surgery

## 2022-03-15 ENCOUNTER — Encounter (HOSPITAL_COMMUNITY): Payer: Self-pay

## 2022-03-15 ENCOUNTER — Other Ambulatory Visit: Payer: Self-pay

## 2022-03-15 VITALS — BP 117/82 | HR 84 | Temp 98.4°F | Resp 16 | Ht 64.5 in | Wt 197.0 lb

## 2022-03-15 DIAGNOSIS — R9431 Abnormal electrocardiogram [ECG] [EKG]: Secondary | ICD-10-CM | POA: Diagnosis not present

## 2022-03-15 DIAGNOSIS — M1611 Unilateral primary osteoarthritis, right hip: Secondary | ICD-10-CM | POA: Diagnosis not present

## 2022-03-15 DIAGNOSIS — Z01818 Encounter for other preprocedural examination: Secondary | ICD-10-CM | POA: Insufficient documentation

## 2022-03-15 HISTORY — DX: Raynaud's syndrome without gangrene: I73.00

## 2022-03-15 HISTORY — DX: Unspecified osteoarthritis, unspecified site: M19.90

## 2022-03-15 LAB — CBC
HCT: 43 % (ref 36.0–46.0)
Hemoglobin: 14.2 g/dL (ref 12.0–15.0)
MCH: 30.9 pg (ref 26.0–34.0)
MCHC: 33 g/dL (ref 30.0–36.0)
MCV: 93.5 fL (ref 80.0–100.0)
Platelets: 304 10*3/uL (ref 150–400)
RBC: 4.6 MIL/uL (ref 3.87–5.11)
RDW: 12.1 % (ref 11.5–15.5)
WBC: 5.8 10*3/uL (ref 4.0–10.5)
nRBC: 0 % (ref 0.0–0.2)

## 2022-03-15 LAB — BASIC METABOLIC PANEL
Anion gap: 9 (ref 5–15)
BUN: 12 mg/dL (ref 6–20)
CO2: 26 mmol/L (ref 22–32)
Calcium: 9.2 mg/dL (ref 8.9–10.3)
Chloride: 104 mmol/L (ref 98–111)
Creatinine, Ser: 0.7 mg/dL (ref 0.44–1.00)
GFR, Estimated: >60 mL/min (ref >60)
Glucose, Bld: 108 mg/dL — ABNORMAL HIGH (ref 70–99)
Potassium: 4.2 mmol/L (ref 3.5–5.1)
Sodium: 139 mmol/L (ref 135–145)

## 2022-03-15 LAB — SURGICAL PCR SCREEN
MRSA, PCR: NEGATIVE
Staphylococcus aureus: POSITIVE — AB

## 2022-03-15 LAB — HEMOGLOBIN A1C
Hgb A1c MFr Bld: 5.3 % (ref 4.8–5.6)
Mean Plasma Glucose: 105.41 mg/dL

## 2022-03-22 ENCOUNTER — Encounter (HOSPITAL_COMMUNITY): Payer: Self-pay | Admitting: Orthopedic Surgery

## 2022-03-22 ENCOUNTER — Ambulatory Visit (HOSPITAL_COMMUNITY): Payer: 59 | Admitting: Physician Assistant

## 2022-03-22 ENCOUNTER — Observation Stay (HOSPITAL_COMMUNITY): Payer: 59

## 2022-03-22 ENCOUNTER — Ambulatory Visit (HOSPITAL_COMMUNITY): Payer: 59

## 2022-03-22 ENCOUNTER — Observation Stay (HOSPITAL_COMMUNITY)
Admission: RE | Admit: 2022-03-22 | Discharge: 2022-03-23 | Disposition: A | Discharge status: Home / Self Care | Payer: 59 | Source: Ambulatory Visit | Attending: Orthopedic Surgery | Admitting: Orthopedic Surgery

## 2022-03-22 ENCOUNTER — Ambulatory Visit (HOSPITAL_BASED_OUTPATIENT_CLINIC_OR_DEPARTMENT_OTHER): Payer: 59 | Admitting: Anesthesiology

## 2022-03-22 ENCOUNTER — Encounter (HOSPITAL_COMMUNITY)
Admission: RE | Disposition: A | Discharge status: Home / Self Care | Payer: Self-pay | Source: Ambulatory Visit | Attending: Orthopedic Surgery

## 2022-03-22 DIAGNOSIS — Z79899 Other long term (current) drug therapy: Secondary | ICD-10-CM | POA: Diagnosis not present

## 2022-03-22 DIAGNOSIS — Z01818 Encounter for other preprocedural examination: Secondary | ICD-10-CM

## 2022-03-22 DIAGNOSIS — M1611 Unilateral primary osteoarthritis, right hip: Principal | ICD-10-CM | POA: Insufficient documentation

## 2022-03-22 DIAGNOSIS — Q6589 Other specified congenital deformities of hip: Secondary | ICD-10-CM | POA: Insufficient documentation

## 2022-03-22 DIAGNOSIS — J45909 Unspecified asthma, uncomplicated: Secondary | ICD-10-CM | POA: Diagnosis not present

## 2022-03-22 DIAGNOSIS — Z96641 Presence of right artificial hip joint: Secondary | ICD-10-CM

## 2022-03-22 HISTORY — PX: TOTAL HIP ARTHROPLASTY: SHX124

## 2022-03-22 LAB — ABO/RH: ABO/RH(D): A NEG

## 2022-03-22 LAB — TYPE AND SCREEN
ABO/RH(D): A NEG
Antibody Screen: NEGATIVE

## 2022-03-22 LAB — POCT PREGNANCY, URINE: Preg Test, Ur: NEGATIVE

## 2022-03-22 SURGERY — ARTHROPLASTY, HIP, TOTAL, ANTERIOR APPROACH
Anesthesia: Spinal | Site: Hip | Laterality: Right

## 2022-03-22 MED ORDER — MENTHOL 3 MG MT LOZG: 1.0000 | LOZENGE | OROMUCOSAL | Status: DC | PRN

## 2022-03-22 MED ORDER — FENTANYL CITRATE PF 50 MCG/ML IJ SOSY
PREFILLED_SYRINGE | INTRAMUSCULAR | Status: AC
  Filled 2022-03-22: qty 1

## 2022-03-22 MED ORDER — DOCUSATE SODIUM 100 MG PO CAPS
100.0000 mg | ORAL_CAPSULE | Freq: Two times a day (BID) | ORAL | Status: DC
  Administered 2022-03-22 – 2022-03-23 (×2): 100 mg via ORAL
  Filled 2022-03-22 (×2): qty 1

## 2022-03-22 MED ORDER — METOCLOPRAMIDE HCL 5 MG/ML IJ SOLN: 5.0000 mg | Freq: Three times a day (TID) | INTRAMUSCULAR | Status: DC | PRN

## 2022-03-22 MED ORDER — CHLORHEXIDINE GLUCONATE 0.12 % MT SOLN
15.0000 mL | Freq: Once | OROMUCOSAL | Status: AC
  Administered 2022-03-22: 15 mL via OROMUCOSAL

## 2022-03-22 MED ORDER — SUGAMMADEX SODIUM 200 MG/2ML IV SOLN
INTRAVENOUS | Status: DC | PRN
  Administered 2022-03-22: 200 mg via INTRAVENOUS

## 2022-03-22 MED ORDER — POVIDONE-IODINE 10 % EX SWAB: 2.0000 | Freq: Once | CUTANEOUS | Status: DC

## 2022-03-22 MED ORDER — ACETAMINOPHEN 10 MG/ML IV SOLN
1000.0000 mg | Freq: Once | INTRAVENOUS | Status: DC | PRN
  Administered 2022-03-22: 1000 mg via INTRAVENOUS

## 2022-03-22 MED ORDER — ACETAMINOPHEN 500 MG PO TABS
1000.0000 mg | ORAL_TABLET | Freq: Four times a day (QID) | ORAL | Status: DC
  Administered 2022-03-23 (×3): 1000 mg via ORAL
  Filled 2022-03-22 (×5): qty 2

## 2022-03-22 MED ORDER — METOPROLOL SUCCINATE ER 50 MG PO TB24
50.0000 mg | ORAL_TABLET | Freq: Two times a day (BID) | ORAL | Status: DC
  Administered 2022-03-22 – 2022-03-23 (×2): 50 mg via ORAL
  Filled 2022-03-22 (×2): qty 1

## 2022-03-22 MED ORDER — DEXAMETHASONE SODIUM PHOSPHATE 10 MG/ML IJ SOLN
8.0000 mg | Freq: Once | INTRAMUSCULAR | Status: AC
  Administered 2022-03-22: 8 mg via INTRAVENOUS

## 2022-03-22 MED ORDER — MIDAZOLAM HCL 2 MG/2ML IJ SOLN
INTRAMUSCULAR | Status: AC
  Filled 2022-03-22: qty 2

## 2022-03-22 MED ORDER — PROPOFOL 500 MG/50ML IV EMUL
INTRAVENOUS | Status: DC | PRN
  Administered 2022-03-22: 125 ug/kg/min via INTRAVENOUS

## 2022-03-22 MED ORDER — DULOXETINE HCL 30 MG PO CPEP
30.0000 mg | ORAL_CAPSULE | Freq: Every day | ORAL | Status: DC
  Administered 2022-03-23: 30 mg via ORAL
  Filled 2022-03-22: qty 1

## 2022-03-22 MED ORDER — FENTANYL CITRATE (PF) 100 MCG/2ML IJ SOLN
INTRAMUSCULAR | Status: DC | PRN
  Administered 2022-03-22 (×2): 50 ug via INTRAVENOUS

## 2022-03-22 MED ORDER — HYDROMORPHONE HCL 1 MG/ML IJ SOLN
0.2500 mg | INTRAMUSCULAR | Status: DC | PRN
  Administered 2022-03-22 (×4): 0.5 mg via INTRAVENOUS

## 2022-03-22 MED ORDER — ROCURONIUM BROMIDE 10 MG/ML (PF) SYRINGE
PREFILLED_SYRINGE | INTRAVENOUS | Status: AC
  Filled 2022-03-22: qty 10

## 2022-03-22 MED ORDER — ACETAMINOPHEN 325 MG PO TABS: 325.0000 mg | ORAL_TABLET | Freq: Four times a day (QID) | ORAL | Status: DC | PRN

## 2022-03-22 MED ORDER — MORPHINE SULFATE (PF) 2 MG/ML IV SOLN
1.0000 mg | INTRAVENOUS | Status: DC | PRN
  Administered 2022-03-22 – 2022-03-23 (×4): 2 mg via INTRAVENOUS
  Filled 2022-03-22 (×4): qty 1

## 2022-03-22 MED ORDER — GABAPENTIN 300 MG PO CAPS
300.0000 mg | ORAL_CAPSULE | Freq: Two times a day (BID) | ORAL | Status: DC
  Administered 2022-03-22 – 2022-03-23 (×2): 300 mg via ORAL
  Filled 2022-03-22 (×2): qty 1

## 2022-03-22 MED ORDER — PROPOFOL 1000 MG/100ML IV EMUL
INTRAVENOUS | Status: AC
  Filled 2022-03-22: qty 100

## 2022-03-22 MED ORDER — HYDROMORPHONE HCL 1 MG/ML IJ SOLN
0.2500 mg | INTRAMUSCULAR | Status: DC | PRN
  Administered 2022-03-22: 0.5 mg via INTRAVENOUS

## 2022-03-22 MED ORDER — PROPOFOL 10 MG/ML IV BOLUS
INTRAVENOUS | Status: DC | PRN
  Administered 2022-03-22 (×2): 20 mg via INTRAVENOUS
  Administered 2022-03-22: 200 mg via INTRAVENOUS

## 2022-03-22 MED ORDER — KETOROLAC TROMETHAMINE 30 MG/ML IJ SOLN
INTRAMUSCULAR | Status: AC
  Filled 2022-03-22: qty 1

## 2022-03-22 MED ORDER — HYDROMORPHONE HCL 1 MG/ML IJ SOLN
INTRAMUSCULAR | Status: AC
  Filled 2022-03-22: qty 1

## 2022-03-22 MED ORDER — AMISULPRIDE (ANTIEMETIC) 5 MG/2ML IV SOLN: 10.0000 mg | Freq: Once | INTRAVENOUS | Status: DC | PRN

## 2022-03-22 MED ORDER — LACTATED RINGERS IV SOLN: INTRAVENOUS | Status: DC

## 2022-03-22 MED ORDER — PHENOL 1.4 % MT LIQD: 1.0000 | OROMUCOSAL | Status: DC | PRN

## 2022-03-22 MED ORDER — CEFAZOLIN SODIUM-DEXTROSE 2-4 GM/100ML-% IV SOLN
2.0000 g | Freq: Four times a day (QID) | INTRAVENOUS | Status: AC
  Administered 2022-03-22 – 2022-03-23 (×2): 2 g via INTRAVENOUS
  Filled 2022-03-22 (×2): qty 100

## 2022-03-22 MED ORDER — METHOCARBAMOL 500 MG IVPB - SIMPLE MED
500.0000 mg | Freq: Four times a day (QID) | INTRAVENOUS | Status: DC | PRN
  Administered 2022-03-22: 500 mg via INTRAVENOUS

## 2022-03-22 MED ORDER — BUPIVACAINE-EPINEPHRINE (PF) 0.25% -1:200000 IJ SOLN
INTRAMUSCULAR | Status: DC | PRN
  Administered 2022-03-22: 30 mL

## 2022-03-22 MED ORDER — ALBUTEROL SULFATE (2.5 MG/3ML) 0.083% IN NEBU: 3.0000 mL | INHALATION_SOLUTION | Freq: Four times a day (QID) | RESPIRATORY_TRACT | Status: DC | PRN

## 2022-03-22 MED ORDER — OXYCODONE HCL 5 MG/5ML PO SOLN: 5.0000 mg | Freq: Once | ORAL | Status: DC | PRN

## 2022-03-22 MED ORDER — OXYCODONE HCL 5 MG PO TABS
ORAL_TABLET | ORAL | Status: AC
  Filled 2022-03-22: qty 2

## 2022-03-22 MED ORDER — LIDOCAINE HCL (PF) 2 % IJ SOLN
INTRAMUSCULAR | Status: AC
  Filled 2022-03-22: qty 5

## 2022-03-22 MED ORDER — KETOROLAC TROMETHAMINE 30 MG/ML IJ SOLN
INTRAMUSCULAR | Status: DC | PRN
  Administered 2022-03-22: 30 mg via INTRAMUSCULAR

## 2022-03-22 MED ORDER — DULOXETINE HCL 60 MG PO CPEP
60.0000 mg | ORAL_CAPSULE | Freq: Every day | ORAL | Status: DC
  Administered 2022-03-23: 60 mg via ORAL
  Filled 2022-03-22: qty 1

## 2022-03-22 MED ORDER — PROPOFOL 10 MG/ML IV BOLUS
INTRAVENOUS | Status: AC
  Filled 2022-03-22: qty 20

## 2022-03-22 MED ORDER — OXYCODONE HCL 5 MG PO TABS: 5.0000 mg | ORAL_TABLET | ORAL | Status: DC | PRN

## 2022-03-22 MED ORDER — LORATADINE 10 MG PO TABS
10.0000 mg | ORAL_TABLET | Freq: Every evening | ORAL | Status: DC
  Administered 2022-03-22 – 2022-03-23 (×2): 10 mg via ORAL
  Filled 2022-03-22 (×2): qty 1

## 2022-03-22 MED ORDER — TRANEXAMIC ACID-NACL 1000-0.7 MG/100ML-% IV SOLN
1000.0000 mg | Freq: Once | INTRAVENOUS | Status: AC
  Administered 2022-03-22: 1000 mg via INTRAVENOUS
  Filled 2022-03-22: qty 100

## 2022-03-22 MED ORDER — CEFAZOLIN SODIUM-DEXTROSE 2-4 GM/100ML-% IV SOLN
2.0000 g | INTRAVENOUS | Status: AC
  Administered 2022-03-22: 2 g via INTRAVENOUS
  Filled 2022-03-22: qty 100

## 2022-03-22 MED ORDER — ONDANSETRON HCL 4 MG/2ML IJ SOLN
INTRAMUSCULAR | Status: AC
  Filled 2022-03-22: qty 2

## 2022-03-22 MED ORDER — ROCURONIUM BROMIDE 100 MG/10ML IV SOLN
INTRAVENOUS | Status: DC | PRN
  Administered 2022-03-22: 80 mg via INTRAVENOUS

## 2022-03-22 MED ORDER — ASPIRIN 81 MG PO CHEW
81.0000 mg | CHEWABLE_TABLET | Freq: Two times a day (BID) | ORAL | Status: DC
  Administered 2022-03-22 – 2022-03-23 (×2): 81 mg via ORAL
  Filled 2022-03-22 (×2): qty 1

## 2022-03-22 MED ORDER — METOCLOPRAMIDE HCL 5 MG PO TABS: 5.0000 mg | ORAL_TABLET | Freq: Three times a day (TID) | ORAL | Status: DC | PRN

## 2022-03-22 MED ORDER — CELECOXIB 200 MG PO CAPS
200.0000 mg | ORAL_CAPSULE | Freq: Two times a day (BID) | ORAL | Status: DC
  Administered 2022-03-23: 200 mg via ORAL
  Filled 2022-03-22: qty 1

## 2022-03-22 MED ORDER — OXYCODONE HCL 5 MG PO TABS
10.0000 mg | ORAL_TABLET | ORAL | Status: DC | PRN
  Administered 2022-03-22 – 2022-03-23 (×3): 15 mg via ORAL
  Filled 2022-03-22 (×3): qty 3

## 2022-03-22 MED ORDER — DIPHENHYDRAMINE HCL 12.5 MG/5ML PO ELIX
12.5000 mg | ORAL_SOLUTION | ORAL | Status: DC | PRN
  Administered 2022-03-23 (×3): 25 mg via ORAL
  Filled 2022-03-22 (×3): qty 10

## 2022-03-22 MED ORDER — ORAL CARE MOUTH RINSE: 15.0000 mL | Freq: Once | OROMUCOSAL | Status: AC

## 2022-03-22 MED ORDER — TRANEXAMIC ACID-NACL 1000-0.7 MG/100ML-% IV SOLN
1000.0000 mg | INTRAVENOUS | Status: AC
  Administered 2022-03-22: 1000 mg via INTRAVENOUS
  Filled 2022-03-22: qty 100

## 2022-03-22 MED ORDER — ONDANSETRON HCL 4 MG PO TABS
4.0000 mg | ORAL_TABLET | Freq: Four times a day (QID) | ORAL | Status: DC | PRN
  Administered 2022-03-22: 4 mg via ORAL
  Filled 2022-03-22: qty 1

## 2022-03-22 MED ORDER — METHOCARBAMOL 500 MG PO TABS
500.0000 mg | ORAL_TABLET | Freq: Four times a day (QID) | ORAL | Status: DC | PRN
  Administered 2022-03-23: 500 mg via ORAL
  Filled 2022-03-22 (×2): qty 1

## 2022-03-22 MED ORDER — FENTANYL CITRATE (PF) 100 MCG/2ML IJ SOLN
INTRAMUSCULAR | Status: AC
  Filled 2022-03-22: qty 2

## 2022-03-22 MED ORDER — KETOROLAC TROMETHAMINE 30 MG/ML IJ SOLN
INTRAMUSCULAR | Status: DC | PRN
  Administered 2022-03-22: 30 mg via INTRAVENOUS

## 2022-03-22 MED ORDER — POLYETHYLENE GLYCOL 3350 17 G PO PACK
17.0000 g | PACK | Freq: Two times a day (BID) | ORAL | Status: DC
  Administered 2022-03-22 – 2022-03-23 (×2): 17 g via ORAL
  Filled 2022-03-22 (×2): qty 1

## 2022-03-22 MED ORDER — TRAZODONE HCL 50 MG PO TABS
50.0000 mg | ORAL_TABLET | Freq: Every day | ORAL | Status: DC
  Administered 2022-03-22: 50 mg via ORAL
  Filled 2022-03-22: qty 1

## 2022-03-22 MED ORDER — BISACODYL 10 MG RE SUPP: 10.0000 mg | Freq: Every day | RECTAL | Status: DC | PRN

## 2022-03-22 MED ORDER — OXYCODONE HCL 5 MG PO TABS: 5.0000 mg | ORAL_TABLET | Freq: Once | ORAL | Status: DC | PRN

## 2022-03-22 MED ORDER — FENTANYL CITRATE PF 50 MCG/ML IJ SOSY
25.0000 ug | PREFILLED_SYRINGE | INTRAMUSCULAR | Status: DC | PRN
  Administered 2022-03-22 (×3): 50 ug via INTRAVENOUS

## 2022-03-22 MED ORDER — BUPIVACAINE IN DEXTROSE 0.75-8.25 % IT SOLN
INTRATHECAL | Status: DC | PRN
  Administered 2022-03-22: 1.6 mL via INTRATHECAL

## 2022-03-22 MED ORDER — BUPIVACAINE-EPINEPHRINE (PF) 0.25% -1:200000 IJ SOLN
INTRAMUSCULAR | Status: AC
  Filled 2022-03-22: qty 30

## 2022-03-22 MED ORDER — DEXAMETHASONE SODIUM PHOSPHATE 10 MG/ML IJ SOLN
INTRAMUSCULAR | Status: AC
  Filled 2022-03-22: qty 1

## 2022-03-22 MED ORDER — AMPHETAMINE-DEXTROAMPHET ER 10 MG PO CP24
20.0000 mg | ORAL_CAPSULE | Freq: Every day | ORAL | Status: DC
  Administered 2022-03-23: 20 mg via ORAL
  Filled 2022-03-22 (×2): qty 2

## 2022-03-22 MED ORDER — ONDANSETRON HCL 4 MG/2ML IJ SOLN
INTRAMUSCULAR | Status: DC | PRN
  Administered 2022-03-22: 4 mg via INTRAVENOUS

## 2022-03-22 MED ORDER — KETOROLAC TROMETHAMINE 15 MG/ML IJ SOLN
7.5000 mg | Freq: Four times a day (QID) | INTRAMUSCULAR | Status: DC
  Administered 2022-03-22 – 2022-03-23 (×3): 7.5 mg via INTRAVENOUS
  Filled 2022-03-22 (×4): qty 1

## 2022-03-22 MED ORDER — ACETAMINOPHEN 10 MG/ML IV SOLN
INTRAVENOUS | Status: AC
  Filled 2022-03-22: qty 100

## 2022-03-22 MED ORDER — DEXMEDETOMIDINE HCL IN NACL 80 MCG/20ML IV SOLN
INTRAVENOUS | Status: DC | PRN
  Administered 2022-03-22: 8 ug via INTRAVENOUS
  Administered 2022-03-22 (×2): 4 ug via INTRAVENOUS
  Administered 2022-03-22: 8 ug via INTRAVENOUS
  Administered 2022-03-22: 4 ug via INTRAVENOUS

## 2022-03-22 MED ORDER — DEXAMETHASONE SODIUM PHOSPHATE 10 MG/ML IJ SOLN
10.0000 mg | Freq: Once | INTRAMUSCULAR | Status: AC
  Administered 2022-03-23: 10 mg via INTRAVENOUS
  Filled 2022-03-22: qty 1

## 2022-03-22 MED ORDER — SODIUM CHLORIDE (PF) 0.9 % IJ SOLN
INTRAMUSCULAR | Status: DC | PRN
  Administered 2022-03-22: 30 mL

## 2022-03-22 MED ORDER — LIDOCAINE HCL (CARDIAC) PF 100 MG/5ML IV SOSY
PREFILLED_SYRINGE | INTRAVENOUS | Status: DC | PRN
  Administered 2022-03-22: 40 mg via INTRAVENOUS

## 2022-03-22 MED ORDER — 0.9 % SODIUM CHLORIDE (POUR BTL) OPTIME
TOPICAL | Status: DC | PRN
  Administered 2022-03-22: 1000 mL

## 2022-03-22 MED ORDER — ACETAMINOPHEN 500 MG PO TABS
1000.0000 mg | ORAL_TABLET | Freq: Once | ORAL | Status: AC
  Administered 2022-03-22: 1000 mg via ORAL
  Filled 2022-03-22: qty 2

## 2022-03-22 MED ORDER — MIDAZOLAM HCL 5 MG/5ML IJ SOLN
INTRAMUSCULAR | Status: DC | PRN
  Administered 2022-03-22: 2 mg via INTRAVENOUS

## 2022-03-22 MED ORDER — SODIUM CHLORIDE 0.9 % IV SOLN: INTRAVENOUS | Status: DC

## 2022-03-22 MED ORDER — METHOCARBAMOL 500 MG IVPB - SIMPLE MED
INTRAVENOUS | Status: AC
  Filled 2022-03-22: qty 55

## 2022-03-22 MED ORDER — DEXMEDETOMIDINE HCL IN NACL 80 MCG/20ML IV SOLN
INTRAVENOUS | Status: AC
  Filled 2022-03-22: qty 20

## 2022-03-22 MED ORDER — LEVOCETIRIZINE DIHYDROCHLORIDE 5 MG PO TABS: 5.0000 mg | ORAL_TABLET | Freq: Every evening | ORAL | Status: DC

## 2022-03-22 MED ORDER — SODIUM CHLORIDE (PF) 0.9 % IJ SOLN
INTRAMUSCULAR | Status: AC
  Filled 2022-03-22: qty 50

## 2022-03-22 MED ORDER — ONDANSETRON HCL 4 MG/2ML IJ SOLN: 4.0000 mg | Freq: Four times a day (QID) | INTRAMUSCULAR | Status: DC | PRN

## 2022-03-22 SURGICAL SUPPLY — 38 items
BAG COUNTER SPONGE SURGICOUNT (BAG) IMPLANT
BAG DECANTER FOR FLEXI CONT (MISCELLANEOUS) IMPLANT
BAG ZIPLOCK 12X15 (MISCELLANEOUS) IMPLANT
BLADE SAG 18X100X1.27 (BLADE) ×1 IMPLANT
COVER PERINEAL POST (MISCELLANEOUS) ×1 IMPLANT
COVER SURGICAL LIGHT HANDLE (MISCELLANEOUS) ×1 IMPLANT
CUP ACETBLR 52 OD PINNACLE (Hips) IMPLANT
DERMABOND ADVANCED .7 DNX12 (GAUZE/BANDAGES/DRESSINGS) ×1 IMPLANT
DRAPE FOOT SWITCH (DRAPES) ×1 IMPLANT
DRAPE STERI IOBAN 125X83 (DRAPES) ×1 IMPLANT
DRAPE U-SHAPE 47X51 STRL (DRAPES) ×2 IMPLANT
DRESSING AQUACEL AG SP 3.5X10 (GAUZE/BANDAGES/DRESSINGS) ×1 IMPLANT
DRSG AQUACEL AG ADV 3.5X10 (GAUZE/BANDAGES/DRESSINGS) IMPLANT
DRSG AQUACEL AG SP 3.5X10 (GAUZE/BANDAGES/DRESSINGS) ×1
DURAPREP 26ML APPLICATOR (WOUND CARE) ×1 IMPLANT
ELECT REM PT RETURN 15FT ADLT (MISCELLANEOUS) ×1 IMPLANT
GLOVE BIO SURGEON STRL SZ 6 (GLOVE) ×1 IMPLANT
GLOVE BIOGEL PI IND STRL 6.5 (GLOVE) ×1 IMPLANT
GLOVE BIOGEL PI IND STRL 7.5 (GLOVE) ×1 IMPLANT
GLOVE ORTHO TXT STRL SZ7.5 (GLOVE) ×2 IMPLANT
GOWN STRL REUS W/ TWL LRG LVL3 (GOWN DISPOSABLE) ×2 IMPLANT
GOWN STRL REUS W/TWL LRG LVL3 (GOWN DISPOSABLE) ×2
HEAD CERAMIC DELTA 36 PLUS 1.5 (Hips) IMPLANT
HOLDER FOLEY CATH W/STRAP (MISCELLANEOUS) ×1 IMPLANT
KIT TURNOVER KIT A (KITS) IMPLANT
LINER NEUTRAL 52X36MM PLUS 4 (Liner) IMPLANT
PACK ANTERIOR HIP CUSTOM (KITS) ×1 IMPLANT
SCREW 6.5MMX30MM (Screw) IMPLANT
STEM FEM ACTIS HIGH SZ2 (Stem) IMPLANT
SUT MNCRL AB 4-0 PS2 18 (SUTURE) ×1 IMPLANT
SUT STRATAFIX 0 PDS 27 VIOLET (SUTURE) ×1
SUT VIC AB 1 CT1 36 (SUTURE) ×3 IMPLANT
SUT VIC AB 2-0 CT1 27 (SUTURE) ×2
SUT VIC AB 2-0 CT1 TAPERPNT 27 (SUTURE) ×2 IMPLANT
SUTURE STRATFX 0 PDS 27 VIOLET (SUTURE) ×1 IMPLANT
TRAY FOLEY MTR SLVR 16FR STAT (SET/KITS/TRAYS/PACK) IMPLANT
TUBE SUCTION HIGH CAP CLEAR NV (SUCTIONS) ×1 IMPLANT
WATER STERILE IRR 1000ML POUR (IV SOLUTION) ×1 IMPLANT

## 2022-03-22 NOTE — Anesthesia Procedure Notes (Signed)
Spinal  Patient location during procedure: OR Start time: 03/22/2022 2:21 PM End time: 03/22/2022 2:25 PM Reason for block: surgical anesthesia Staffing Performed: resident/CRNA  Resident/CRNA: Niel Hummer, CRNA Performed by: Niel Hummer, CRNA Authorized by: Brennan Bailey, MD   Preanesthetic Checklist Completed: patient identified, IV checked, site marked, surgical consent, monitors and equipment checked, pre-op evaluation and timeout performed Spinal Block Patient position: sitting Prep: DuraPrep Patient monitoring: heart rate, continuous pulse ox and blood pressure Approach: midline Location: L3-4 Injection technique: single-shot Needle Needle type: Pencan  Needle gauge: 24 G Needle length: 10 cm Additional Notes Expiration date of kit checked. Clear CSF flow prior to injection. Dr. Daiva Huge present

## 2022-03-22 NOTE — Discharge Instructions (Signed)

## 2022-03-22 NOTE — Anesthesia Procedure Notes (Signed)
Procedure Name: MAC Date/Time: 03/22/2022 2:21 PM  Performed by: Niel Hummer, CRNAPre-anesthesia Checklist: Patient identified, Emergency Drugs available, Patient being monitored and Suction available Oxygen Delivery Method: Simple face mask

## 2022-03-22 NOTE — Op Note (Signed)
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      SURGEON:  Pietro Cassis. Alvan Dame, M.D.      ASSISTANT:  Costella Hatcher, PA-C     ANESTHESIA:  Spinal.      SPECIMENS:  None.      COMPLICATIONS:  None.      BLOOD LOSS:  500 cc     DRAINS:  None.      INDICATION OF THE PROCEDURE:  Christine Schneider is a 32 y.o. female who had   presented to office for evaluation of right hip pain.  Radiographs revealed   progressive degenerative changes with bone-on-bone   articulation of the  hip joint, including subchondral cystic changes and osteophytes.  The patient had painful limited range of   motion significantly affecting their overall quality of life and function.  The patient was failing to    respond to conservative measures including medications and/or injections and activity modification and at this point was ready   to proceed with more definitive measures.  Consent was obtained for   benefit of pain relief.  Specific risks of infection, DVT, component   failure, dislocation, neurovascular injury, and need for revision surgery were reviewed in the office She has undergone to previous arthroscopic surgeries on this hip with persistent complaints of right hip pain.  Importantly she had an intra-articular injection which she  report provided relief of her symptom albeit short term.  Based on this and her persistent pain affecting her quality of life she elected to proceed with total hip replacement.     PROCEDURE IN DETAIL:  The patient was brought to operative theater.   Once adequate anesthesia, preoperative antibiotics, 2 gm of Ancef, 1 gm of Tranexamic Acid, and 10 mg of Decadron were administered, the patient was positioned supine on the [employer] table.  Once the patient was safely positioned with adequate padding of boney prominences we predraped out the hip, and used fluoroscopy to confirm orientation of the pelvis.      The right hip was then prepped and draped from proximal iliac crest to   mid thigh with a shower curtain technique.      Time-out was performed identifying the patient, planned procedure, and the appropriate extremity.     An incision was then made 2 cm lateral to the   anterior superior iliac spine extending over the orientation of the   tensor fascia lata muscle and sharp  dissection was carried down to the   fascia of the muscle.      The fascia was then incised.  The muscle belly was identified and swept   laterally and retractor placed along the superior neck.  Following   cauterization of the circumflex vessels and removing some pericapsular   fat, a second cobra retractor was placed on the inferior neck.  A T-capsulotomy was made along the line of the   superior neck to the trochanteric fossa, then extended proximally and   distally.  Tag sutures were placed and the retractors were then placed   intracapsular.  We then identified the trochanteric fossa and   orientation of my neck cut and then made a neck osteotomy with the femur on traction.  The femoral   head was removed without difficulty or complication.  Traction was let   off and retractors were placed posterior and anterior around the   acetabulum.      The labrum and foveal tissue were debrided.  I began reaming with a 45  mm   reamer and reamed up to 51 mm reamer with good bony bed preparation and a 52 mm  cup was chosen.  The final 52 mm Pinnacle cup was then impacted under fluoroscopy to confirm the depth of penetration and orientation with respect to   Abduction and forward flexion.  A screw was placed into the ilium followed by the hole eliminator.  The final   36+4 neutral Altrex liner was impacted with good visualized rim fit.  The cup was positioned anatomically within the acetabular portion of the pelvis.      At this point, the femur was rolled to 100 degrees.  Further capsule was   released off the inferior aspect of the femoral neck.  I then   released the superior capsule proximally.  With the leg in a neutral position the hook was placed laterally   along the femur under the vastus lateralis origin and elevated manually and then held in position using the hook attachment on the bed.  The leg was then extended and adducted with the leg rolled to 100   degrees of external rotation.  Retractors were placed along the medial calcar and posteriorly over the greater trochanter.  Once the proximal femur was fully   exposed, I used a box osteotome to set orientation.  I then began   broaching with the starting chili pepper broach and passed this by hand and then broached up to 2.  With the 2 broach in place I chose a high offset neck and did several trial reductions.  The offset was appropriate, leg lengths   appeared to be equal best matched with the +1.5 head ball trial confirmed radiographically.   Given these findings, I went ahead and dislocated the hip, repositioned all   retractors and positioned the right hip in the extended and abducted position.  The final 2 Hi Actis stem was   chosen and it was impacted down to the level of neck cut.  Based on this   and the trial reductions, a final 36+1.5 delta ceramic ball was chosen and   impacted onto a clean and dry trunnion, and the hip was reduced.  The    hip had been irrigated throughout the case again at this point.  I did   reapproximate the superior capsular leaflet to the anterior leaflet   using #1 Vicryl.  The fascia of the   tensor fascia lata  muscle was then reapproximated using #1 Vicryl and #0 Stratafix sutures.  The   remaining wound was closed with 2-0 Vicryl and running 4-0 Monocryl.   The hip was cleaned, dried, and dressed sterilely using Dermabond and   Aquacel dressing.  The patient was then brought   to recovery room in stable condition tolerating the procedure well.   Costella Hatcher, PA-C was present for the entirety of the case involved from preoperative positioning, perioperative retractor management, general facilitation of the case, as well as primary wound closure as assistant.            Pietro Cassis Alvan Dame, M.D.        03/22/2022 2:33 PM

## 2022-03-22 NOTE — Anesthesia Preprocedure Evaluation (Addendum)
Anesthesia Evaluation  Patient identified by MRN, date of birth, ID band Patient awake    Reviewed: Allergy & Precautions, NPO status , Patient's Chart, lab work & pertinent test results, reviewed documented beta blocker date and time   History of Anesthesia Complications (+) PONV and history of anesthetic complications  Airway Mallampati: II  TM Distance: >3 FB Neck ROM: Full    Dental no notable dental hx.    Pulmonary asthma    Pulmonary exam normal        Cardiovascular hypertension, Pt. on medications and Pt. on home beta blockers Normal cardiovascular exam     Neuro/Psych   Anxiety Depression Bipolar Disorder   RLS    GI/Hepatic Neg liver ROS, hiatal hernia,GERD  Controlled,,  Endo/Other  diabetes    Renal/GU negative Renal ROS  negative genitourinary   Musculoskeletal  (+) Arthritis ,    Abdominal   Peds  Hematology negative hematology ROS (+)   Anesthesia Other Findings Day of surgery medications reviewed with patient.  Reproductive/Obstetrics negative OB ROS                              Anesthesia Physical Anesthesia Plan  ASA: 2  Anesthesia Plan: Spinal   Post-op Pain Management: Tylenol PO (pre-op)*   Induction:   PONV Risk Score and Plan: 3 and Treatment may vary due to age or medical condition, Ondansetron, Dexamethasone, Midazolam, Propofol infusion and TIVA  Airway Management Planned: Simple Face Mask and Natural Airway  Additional Equipment: None  Intra-op Plan:   Post-operative Plan:   Informed Consent: I have reviewed the patients History and Physical, chart, labs and discussed the procedure including the risks, benefits and alternatives for the proposed anesthesia with the patient or authorized representative who has indicated his/her understanding and acceptance.     Dental advisory given  Plan Discussed with: CRNA  Anesthesia Plan Comments:          Anesthesia Quick Evaluation

## 2022-03-22 NOTE — Anesthesia Postprocedure Evaluation (Signed)
Anesthesia Post Note  Patient: Christine Schneider  Procedure(s) Performed: TOTAL HIP ARTHROPLASTY ANTERIOR APPROACH (Right: Hip)     Patient location during evaluation: PACU Anesthesia Type: Spinal and General Level of consciousness: awake and alert Pain management: pain level controlled Vital Signs Assessment: post-procedure vital signs reviewed and stable Respiratory status: spontaneous breathing, nonlabored ventilation and respiratory function stable Cardiovascular status: blood pressure returned to baseline Postop Assessment: no apparent nausea or vomiting Anesthetic complications: no Comments: Difficulty with pain control in PACU. Has received fentanyl 11mg, dilaudid 2.58m precedex 2848m tylenol, and ketorolac perioperatively. Patient is opioid-naive. Plan admission for pain management overnight. -Christine Schneider   No notable events documented.  Last Vitals:  Vitals:   03/22/22 1800 03/22/22 1815  BP: 114/76 111/75  Pulse: 88 90  Resp: 15 (!) 22  Temp:    SpO2: 100% 96%    Last Pain:  Vitals:   03/22/22 1815  TempSrc:   PainSc: 8  Christine Schneider

## 2022-03-22 NOTE — Transfer of Care (Signed)
Immediate Anesthesia Transfer of Care Note  Patient: Christine Schneider  Procedure(s) Performed: TOTAL HIP ARTHROPLASTY ANTERIOR APPROACH (Right: Hip)  Patient Location: PACU  Anesthesia Type:General and Spinal  Level of Consciousness: oriented, drowsy, and patient cooperative  Airway & Oxygen Therapy: Patient Spontanous Breathing and Patient connected to face mask oxygen  Post-op Assessment: Report given to RN and Post -op Vital signs reviewed and stable  Post vital signs: Reviewed  Last Vitals:  Vitals Value Taken Time  BP 153/135 03/22/22 1627  Temp    Pulse 72 03/22/22 1630  Resp 14 03/22/22 1630  SpO2 100 % 03/22/22 1630  Vitals shown include unvalidated device data.  Last Pain:  Vitals:   03/22/22 1221  TempSrc: Oral         Complications: No notable events documented.

## 2022-03-22 NOTE — Interval H&P Note (Signed)
History and Physical Interval Note:  03/22/2022 1:16 PM  Christine Schneider  has presented today for surgery, with the diagnosis of Right hip osteoarthritis.  The various methods of treatment have been discussed with the patient and family. After consideration of risks, benefits and other options for treatment, the patient has consented to  Procedure(s) with comments: [place] (Right) - 90 as a surgical intervention.  The patient's history has been reviewed, patient examined, no change in status, stable for surgery.  I have reviewed the patient's chart and labs.  Questions were answered to the patient's satisfaction.     Mauri Pole

## 2022-03-22 NOTE — H&P (Signed)
TOTAL HIP ADMISSION H&P  Patient is admitted for right total hip arthroplasty.  Subjective:  Chief Complaint: right hip pain  HPI: Christine Schneider, 32 y.o. female, has a history of pain and functional disability in the right hip(s) due to arthritis and patient has failed non-surgical conservative treatments for greater than 12 weeks to include NSAID's and/or analgesics, corticosteriod injections, and activity modification.  Onset of symptoms was gradual starting 3 years ago with gradually worsening course since that time.The patient noted prior procedures of the hip to include arthroscopy on the right hip(s).  Patient currently rates pain in the right hip at 8 out of 10 with activity. Patient has worsening of pain with activity and weight bearing, pain that interfers with activities of daily living, and pain with passive range of motion. Patient has evidence of joint space narrowing by imaging studies. This condition presents safety issues increasing the risk of falls. There is no current active infection.  Patient Active Problem List   Diagnosis Date Noted   History of fatty infiltration of liver 02/21/2022   Encounter for health-related screening 02/21/2022   History of PCOS 02/21/2022   Bilateral carpal tunnel syndrome 09/08/2021   Osteoarthritis 06/08/2021   Chronic hip pain, bilateral 05/04/2021   Snoring 05/04/2021   Body mass index (BMI) of 33.0 to 33.9 in adult 05/04/2021   Paroxysmal atrial tachycardia 11/20/2019   Mixed hyperlipidemia    Hearing reduced, bilateral 11/30/2018   S/P hip arthroscopy 09/14/2018   Raynaud's disease 04/15/2018   RLS (restless legs syndrome) 04/15/2018   Elevated ALT measurement 02/05/2018   SVT (supraventricular tachycardia) 02/01/2018   History of gestational diabetes 08/28/2017   Hot flashes 03/08/2017   Weight gain 03/08/2017   Anxiety and depression 05/27/2016   History of atrial flutter 05/27/2016   Bipolar I disorder, most recent episode  depressed, mild ([place]) 12/30/2015   Somatic symptom disorder 12/30/2015   Attention deficit hyperactivity disorder, 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   Current Outpatient Medications  Medication Sig Dispense Refill Last Dose   albuterol (VENTOLIN HFA) 108 (90 Base) MCG/ACT inhaler Inhale 2 puffs into the lungs every 6 (six) hours as needed for wheezing or shortness of breath. 18  g 11    amphetamine-dextroamphetamine (ADDERALL XR) 20 MG 24 hr capsule Take 20 mg by mouth in the morning.      DULoxetine (CYMBALTA) 30 MG capsule Take 30 mg by mouth See admin instructions. Take with 60 mg for a total of 90 mg in the morning      DULoxetine (CYMBALTA) 60 MG capsule Take 60 mg by mouth See admin instructions. Take with 30 mg for a total of 90 mg in the morning      ESTARYLLA 0.25-35 MG-MCG tablet Take 1 tablet by mouth daily.      gabapentin (NEURONTIN) 300 MG capsule Take 300 mg by mouth 2 (two) times daily.      levocetirizine (XYZAL) 5 MG tablet Take 5 mg by mouth every evening.      metoprolol succinate (TOPROL-XL) 50 MG 24 hr tablet Take 50 mg by mouth 2 (two) times daily.      Multiple Vitamins-Minerals (MULTIVITAMIN WITH MINERALS) tablet Take 1 tablet by mouth daily.      Omega-3 Fatty Acids (FISH OIL) 1000 MG CAPS Take 2,000 mg by mouth at bedtime.      RESVERATROL PO Take 2 tablets by mouth daily. Gelcap      traZODone (DESYREL) 50 MG tablet Take 50 mg by mouth at bedtime.      Allergies  Allergen Reactions   Oxycodone-Acetaminophen Nausea And Vomiting    Must get Zofran or Phenergan before   Hydromorphone Itching and Other (See Comments)    Dilaudid    Social History   Tobacco Use   Smoking status: Never   Smokeless tobacco: Never  Substance Use Topics   Alcohol use: Yes    Comment: rare    Family History  Problem Relation Age of Onset   Hyperlipidemia Mother    Hypertension Father    Depression Sister    Hypothyroidism Daughter        congenital   Breast cancer Paternal Aunt    Lymphoma Maternal Grandfather        B cell   Heart disease Maternal Grandfather    Heart attack Paternal Grandmother    Diabetes type I Paternal Grandfather    Esophageal cancer Other        mat great uncle, started in his mouth, smoker   Colon cancer Neg Hx    CVA Neg Hx      Review of Systems  Constitutional:  Negative for chills and fever.  Respiratory:   Negative for cough and shortness of breath.   Cardiovascular:  Negative for chest pain.  Gastrointestinal:  Negative for nausea and vomiting.  Musculoskeletal:  Positive for arthralgias.     Objective:  Physical Exam Well nourished and well developed. General: Alert and oriented x3, cooperative and pleasant, no acute distress. Head: normocephalic, atraumatic, neck supple. Eyes: EOMI.  Musculoskeletal: Right hip exam: She does have pain with hip flexion internal rotation over 20 degrees with external rotation over 30 degrees She does  have reproducible pain anteriorly She has 5/5 strength with active hip flexion and abduction She is neurovascular intact distally Left hip exam reveals fluid range of motion left hip without reproducible groin pain.  Calves soft and nontender. Motor function intact in LE. Strength 5/5 LE bilaterally. Neuro: Distal pulses 2+. Sensation to light touch intact in LE  Vital signs in last 24 hours:    Labs:   Estimated body mass index is 33.29 kg/m as calculated from the following:   Height as of 03/15/22: 5' 4.5" (1.638 m).   Weight as of 03/15/22: 89.4 kg.   Imaging Review Plain radiographs demonstrate severe degenerative joint disease of the right hip(s). The bone quality appears to be adequate for age and reported activity level.      Assessment/Plan:  End stage arthritis, right hip(s)  The patient history, physical examination, clinical judgement of the provider and imaging studies are consistent with end stage degenerative joint disease of the right hip(s) and total hip arthroplasty is deemed medically necessary. The treatment options including medical management, injection therapy, arthroscopy and arthroplasty were discussed at length. The risks and benefits of total hip arthroplasty were presented and reviewed. The risks due to aseptic loosening, infection, stiffness, dislocation/subluxation,  thromboembolic complications and other  imponderables were discussed.  The patient acknowledged the explanation, agreed to proceed with the plan and consent was signed. Patient is being admitted for inpatient treatment for surgery, pain control, PT, OT, prophylactic antibiotics, VTE prophylaxis, progressive ambulation and ADL's and discharge planning.The patient is planning to be discharged  home.  Therapy Plans: HEP Disposition: Home with husband Planned DVT Prophylaxis: aspirin 31m BID DME needed: walker PCP: Dr. SClarise CruzEarly, clearance received Cardiologist: Dr. AMinna Merritts (hx of SVT) TXA: IV Allergies: dilaudid - itching, percocet - nausea/vomiting (tolerates with zofran/phenergan) Anesthesia Concerns: none - had epidurals with delivery previously BMI: 33.9 Last HgbA1c: Not diabetic   Other: - oxycodone, robaxin, tylenol, celebrex, zofran - staying overnight - pain related - patient reports difficulty with pain management - No hx of VTE or cancer - TAKE PICTURES  ACostella Hatcher PA-C Orthopedic Surgery [facility] [place] [phone]

## 2022-03-23 ENCOUNTER — Encounter (HOSPITAL_COMMUNITY): Payer: Self-pay | Admitting: Orthopedic Surgery

## 2022-03-23 ENCOUNTER — Other Ambulatory Visit: Payer: Self-pay

## 2022-03-23 DIAGNOSIS — M1611 Unilateral primary osteoarthritis, right hip: Secondary | ICD-10-CM | POA: Diagnosis not present

## 2022-03-23 LAB — BASIC METABOLIC PANEL
Anion gap: 7 (ref 5–15)
BUN: 8 mg/dL (ref 6–20)
CO2: 22 mmol/L (ref 22–32)
Calcium: 8.2 mg/dL — ABNORMAL LOW (ref 8.9–10.3)
Chloride: 104 mmol/L (ref 98–111)
Creatinine, Ser: 0.7 mg/dL (ref 0.44–1.00)
GFR, Estimated: >60 mL/min (ref >60)
Glucose, Bld: 167 mg/dL — ABNORMAL HIGH (ref 70–99)
Potassium: 4.6 mmol/L (ref 3.5–5.1)
Sodium: 133 mmol/L — ABNORMAL LOW (ref 135–145)

## 2022-03-23 LAB — CBC
HCT: 34.3 % — ABNORMAL LOW (ref 36.0–46.0)
Hemoglobin: 11.4 g/dL — ABNORMAL LOW (ref 12.0–15.0)
MCH: 30.8 pg (ref 26.0–34.0)
MCHC: 33.2 g/dL (ref 30.0–36.0)
MCV: 92.7 fL (ref 80.0–100.0)
Platelets: 266 10*3/uL (ref 150–400)
RBC: 3.7 MIL/uL — ABNORMAL LOW (ref 3.87–5.11)
RDW: 11.9 % (ref 11.5–15.5)
WBC: 9.2 10*3/uL (ref 4.0–10.5)
nRBC: 0 % (ref 0.0–0.2)

## 2022-03-23 MED ORDER — ONDANSETRON HCL 4 MG/2ML IJ SOLN
INTRAMUSCULAR | Status: AC
  Filled 2022-03-23: qty 2

## 2022-03-23 MED ORDER — TIZANIDINE HCL 4 MG PO TABS: 2.0000 mg | ORAL_TABLET | Freq: Three times a day (TID) | ORAL | Status: DC | PRN

## 2022-03-23 MED ORDER — PHENYLEPHRINE HCL (PRESSORS) 10 MG/ML IV SOLN
INTRAVENOUS | Status: AC
  Filled 2022-03-23: qty 1

## 2022-03-23 MED ORDER — SENNA 8.6 MG PO TABS: 1.0000 | ORAL_TABLET | Freq: Every day | ORAL | 0 refills | Status: AC

## 2022-03-23 MED ORDER — TIZANIDINE HCL 2 MG PO TABS: 2.0000 mg | ORAL_TABLET | Freq: Three times a day (TID) | ORAL | 2 refills | Status: DC | PRN

## 2022-03-23 MED ORDER — ACETAMINOPHEN 500 MG PO TABS: 1000.0000 mg | ORAL_TABLET | Freq: Four times a day (QID) | ORAL | 0 refills | Status: DC

## 2022-03-23 MED ORDER — POLYETHYLENE GLYCOL 3350 17 G PO PACK: 17.0000 g | PACK | Freq: Two times a day (BID) | ORAL | 0 refills | Status: DC

## 2022-03-23 MED ORDER — ASPIRIN 81 MG PO CHEW: 81.0000 mg | CHEWABLE_TABLET | Freq: Two times a day (BID) | ORAL | 0 refills | Status: AC

## 2022-03-23 MED ORDER — ONDANSETRON HCL 4 MG PO TABS: 4.0000 mg | ORAL_TABLET | Freq: Four times a day (QID) | ORAL | 0 refills | Status: DC | PRN

## 2022-03-23 MED ORDER — DEXAMETHASONE SODIUM PHOSPHATE 10 MG/ML IJ SOLN
INTRAMUSCULAR | Status: AC
  Filled 2022-03-23: qty 1

## 2022-03-23 MED ORDER — CELECOXIB 200 MG PO CAPS: 200.0000 mg | ORAL_CAPSULE | Freq: Two times a day (BID) | ORAL | 0 refills | Status: DC

## 2022-03-23 MED ORDER — OXYCODONE HCL 5 MG PO TABS: 5.0000 mg | ORAL_TABLET | ORAL | 0 refills | Status: DC | PRN

## 2022-03-23 NOTE — Progress Notes (Signed)
PT TX NOTE   03/23/22 1400  PT Visit Information  Last PT Received On 03/23/22  Assistance Needed Pt continues to make excellent progress, meeting PT goals; pt is still receiving IV pain meds therefore will defer to d/c to MD and RN. Pt is cleared from PT standpoint (overall mod I to supervision with mobility) however will continue to follow during acute stay.  History of Present Illness 32 yo female s/p R AA THA on 03/23/22. PMH: anxiety, depression, bipolar, somatic symptom disorder, R elbow ulnar nerve release  Precautions  Precautions None  Restrictions  Weight Bearing Restrictions No  Other Position/Activity Restrictions WBAT  Pain Assessment  Pain Assessment 0-10  Pain Score 5  Pain Location right hip  Pain Descriptors / Indicators Sore  Pain Intervention(s) Limited activity within patient's tolerance;Monitored during session;Premedicated before session;Repositioned;Ice applied  Cognition  Arousal/Alertness Awake/alert  Behavior During Therapy WFL for tasks assessed/performed  Overall Cognitive Status Within Functional Limits for tasks assessed  Bed Mobility  Overal bed mobility Modified Independent  Transfers  Equipment used Rolling walker (2 wheels)  Transfers Sit to/from Stand  Sit to Stand Supervision;Modified independent (Device/Increase time)  General transfer comment cues for hand placement  Ambulation/Gait  Ambulation/Gait assistance Supervision;Modified independent (Device/Increase time)  Gait Distance (Feet) 380 Feet  Assistive device Rolling walker (2 wheels)  Gait Pattern/deviations Step-through pattern  General Gait Details cues for progression, improving stride length and wt  shift to RLE  Stairs Yes  Stairs assistance Min guard  Stair Management No rails;Step to pattern;Forwards;With walker  Number of Stairs 4  General stair comments verbal cues for sequence and technique. good stability, no LOB  PT - End of Session  Equipment Utilized During Treatment Gait  belt  Activity Tolerance Patient tolerated treatment well  Patient left in bed;with call bell/phone within reach;with bed alarm set   PT - Assessment/Plan  PT Plan Current plan remains appropriate  PT Visit Diagnosis Difficulty in walking, not elsewhere classified (R26.2)  PT Frequency (ACUTE ONLY) 7X/week  Follow Up Recommendations Follow physician's recommendations for discharge plan and follow up therapies  Assistance recommended at discharge Intermittent Supervision/Assistance  Patient can return home with the following Help with stairs or ramp for entrance;Assistance with cooking/housework;Assist for transportation  PT equipment None recommended by PT  AM-PAC PT "6 Clicks" Mobility Outcome Measure (Version 2)  Help needed turning from your back to your side while in a flat bed without using bedrails? 3  Help needed moving from lying on your back to sitting on the side of a flat bed without using bedrails? 3  Help needed moving to and from a bed to a chair (including a wheelchair)? 3  Help needed standing up from a chair using your arms (e.g., wheelchair or bedside chair)? 3  Help needed to walk in hospital room? 3  Help needed climbing 3-5 steps with a railing?  3  6 Click Score 18  Consider Recommendation of Discharge To: Home with [facility]  PT Goal Progression  Progress towards PT goals Progressing toward goals  Acute Rehab PT Goals  PT Goal Formulation With patient  Time For Goal Achievement 03/30/22  Potential to Achieve Goals Good  PT Time Calculation  PT Start Time (ACUTE ONLY) 1344  PT Stop Time (ACUTE ONLY) 1412  PT Time Calculation (min) (ACUTE ONLY) 28 min  PT General Charges  $$ ACUTE PT VISIT 1 Visit  PT Treatments  $Gait Training 23-37 mins

## 2022-03-23 NOTE — Evaluation (Signed)
Physical Therapy Evaluation Patient Details Name: Christine Schneider MRN: [id number] DOB: 1990-03-03 Today's Date: 03/23/2022  History of Present Illness  32 yo female s/p R AA THA on 03/23/22. PMH: anxiety, depression, bipolar, somatic symptom disorder, R elbow ulnar nerve release  Clinical Impression  Pt is s/p THA resulting in the deficits listed below (see PT Problem List).  Pt doing well, amb ~ 200' with RW and supervision for safety. Pt concerned about d/c home today and prefers to stay another  day, will relay info to RN. Will see pt again inpm.  Pt will benefit from skilled PT to increase their independence and safety with mobility to allow discharge to the venue listed below.         Recommendations for follow up therapy are one component of a multi-disciplinary discharge planning process, led by the attending physician.  Recommendations may be updated based on patient status, additional functional criteria and insurance authorization.  Follow Up Recommendations Follow physician's recommendations for discharge plan and follow up therapies      Assistance Recommended at Discharge Intermittent Supervision/Assistance  Patient can return home with the following  Help with stairs or ramp for entrance;Assistance with cooking/housework;Assist for transportation    Equipment Recommendations None recommended by PT  Recommendations for Other Services       Functional Status Assessment Patient has had a recent decline in their functional status and demonstrates the ability to make significant improvements in function in a reasonable and predictable amount of time.     Precautions / Restrictions Precautions Precautions: None Restrictions RLE Weight Bearing: Weight bearing as tolerated      Mobility  Bed Mobility Overal bed mobility: Needs Assistance Bed Mobility: Supine to Sit     Supine to sit: Modified independent (Device/Increase time)          Transfers Overall  transfer level: Needs assistance Equipment used: Rolling walker (2 wheels) Transfers: Sit to/from Stand Sit to Stand: Min guard           General transfer comment: cues for hand placement    Ambulation/Gait Ambulation/Gait assistance: Min guard, Supervision Gait Distance (Feet): 200 Feet Assistive device: Rolling walker (2 wheels) Gait Pattern/deviations: Step-through pattern       General Gait Details: min-guard to supervision for safety, cues for RW position  Stairs            Wheelchair Mobility    Modified Rankin (Stroke Patients Only)       Balance                                             Pertinent Vitals/Pain Pain Assessment Pain Assessment: 0-10 Pain Score: 4  Pain Location: right hip Pain Descriptors / Indicators: Sore Pain Intervention(s): Limited activity within patient's tolerance, Monitored during session, Premedicated before session, Repositioned    Home Living Family/patient expects to be discharged to:: Private residence Living Arrangements: Spouse/significant other;Children Available Help at Discharge: Family;Available 24 hours/day Type of Home: House Home Access: Stairs to enter Entrance Stairs-Rails: None Entrance Stairs-Number of Steps: 2-3   Home Layout: One level Home Equipment: Conservation officer, nature (2 wheels)      Prior Function Prior Level of Function : Independent/Modified Independent                     Hand Dominance  Extremity/Trunk Assessment   Upper Extremity Assessment Upper Extremity Assessment: Overall WFL for tasks assessed    Lower Extremity Assessment Lower Extremity Assessment: RLE deficits/detail RLE Deficits / Details: ankle WFL, knee and hip grossly 3/5. limited by post op pain       Communication      Cognition Arousal/Alertness: Awake/alert Behavior During Therapy: WFL for tasks assessed/performed Overall Cognitive Status: Within Functional Limits for tasks  assessed                                          General Comments      Exercises Total Joint Exercises Ankle Circles/Pumps: AROM, Both, 10 reps Quad Sets: AROM, 10 reps, Both Heel Slides: AROM, AAROM, Right, 10 reps   Assessment/Plan    PT Assessment Patient needs continued PT services  PT Problem List Decreased strength;Decreased activity tolerance;Decreased mobility;Pain;Decreased knowledge of precautions       PT Treatment Interventions DME instruction;Therapeutic exercise;Functional mobility training;Therapeutic activities;Patient/family education;Gait training;Stair training    PT Goals (Current goals can be found in the Care Plan section)  Acute Rehab PT Goals PT Goal Formulation: With patient Time For Goal Achievement: 03/30/22 Potential to Achieve Goals: Good    Frequency 7X/week     Co-evaluation               AM-PAC PT "6 Clicks" Mobility  Outcome Measure Help needed turning from your back to your side while in a flat bed without using bedrails?: A Little Help needed moving from lying on your back to sitting on the side of a flat bed without using bedrails?: A Little Help needed moving to and from a bed to a chair (including a wheelchair)?: A Little Help needed standing up from a chair using your arms (e.g., wheelchair or bedside chair)?: A Little Help needed to walk in hospital room?: A Little Help needed climbing 3-5 steps with a railing? : A Little 6 Click Score: 18    End of Session Equipment Utilized During Treatment: Gait belt Activity Tolerance: Patient tolerated treatment well Patient left: with call bell/phone within reach;in chair;with chair alarm set   PT Visit Diagnosis: Difficulty in walking, not elsewhere classified (R26.2)    Time: [id number] PT Time Calculation (min) (ACUTE ONLY): 25 min   Charges:   PT Evaluation $PT Eval Low Complexity: 1 Low PT Treatments $Gait Training: 8-22 mins        Baxter Flattery,  PT  Acute Rehab Dept [facility]) [phone]  [facility] Weekend Pager (Saturday/Sunday only)  [phone]  03/23/2022   [facility] 03/23/2022, 11:33 AM

## 2022-03-23 NOTE — TOC Transition Note (Signed)
Transition of Care [facility]) - CM/SW Discharge Note  Patient Details  Name: JALINDA SEGER MRN: [id number] Date of Birth: September 18, 1990  Transition of Care [facility]) CM/SW Contact:  Sherie Don, LCSW Phone Number: 03/23/2022, 10:08 AM  Clinical Narrative: Patient is expected to discharge home after working with PT. CSW met with patient to confirm discharge plan and needs. Patient will go home with a home exercise program (HEP). Patient will need a rolling walker, which [employer] delivered to patient's room. TOC signing off.    Final next level of care: Home/Self Care Barriers to Discharge: No Barriers Identified  Patient Goals and CMS Choice CMS Medicare.gov Compare Post Acute Care list provided to:: Patient Choice offered to / list presented to : Patient  Discharge Plan and Services Additional resources added to the After Visit Summary for          DME Arranged: Walker rolling DME Agency: [employer] Representative spoke with at DME Agency: Prearranged in orthopedist's office  Social Determinants of Health (SDOH) Interventions SDOH Screenings   Food Insecurity: No Food Insecurity (03/23/2022)  Housing: Low Risk  (03/23/2022)  Transportation Needs: No Transportation Needs (03/23/2022)  Utilities: Not At Risk (03/23/2022)  Depression (PHQ2-9): Low Risk  (05/03/2021)  Tobacco Use: Low Risk  (03/22/2022)   Readmission Risk Interventions     No data to display

## 2022-03-23 NOTE — Progress Notes (Signed)
   Subjective: 1 Day Post-Op Procedure(s) (LRB): TOTAL HIP ARTHROPLASTY ANTERIOR APPROACH (Right) Patient reports pain as moderate.   Patient seen in rounds with Dr. Alvan Dame. Patient is resting in bed on exam this morning. No acute events overnight. Foley catheter removed. Patient has not been up with PT yet. We will start therapy today.   Objective: Vital signs in last 24 hours: Temp:  [97 F (36.1 C)-99.5 F (37.5 C)] 98.2 F (36.8 C) (02/21 0410) Pulse Rate:  [70-95] 73 (02/21 0817) Resp:  [12-22] 16 (02/21 0817) BP: (101-139)/(59-100) 102/65 (02/21 0817) SpO2:  [95 %-100 %] 100 % (02/21 0817) Weight:  [89.4 kg] 89.4 kg (02/20 1253)  Intake/Output from previous day:  Intake/Output Summary (Last 24 hours) at 03/23/2022 0856 Last data filed at 03/23/2022 0819 Gross per 24 hour  Intake 1578.9 ml  Output 1100 ml  Net 478.9 ml     Intake/Output this shift: No intake/output data recorded.  Labs: Recent Labs    03/23/22 0316  HGB 11.4*   Recent Labs    03/23/22 0316  WBC 9.2  RBC 3.70*  HCT 34.3*  PLT 266   Recent Labs    03/23/22 0316  NA 133*  K 4.6  CL 104  CO2 22  BUN 8  CREATININE 0.70  GLUCOSE 167*  CALCIUM 8.2*   No results for input(s): "LABPT", "INR" in the last 72 hours.  Exam: General - Patient is Alert and Oriented Extremity - Neurologically intact Sensation intact distally Intact pulses distally Dorsiflexion/Plantar flexion intact Dressing - dressing C/D/I Motor Function - intact, moving foot and toes well on exam.   Past Medical History:  Diagnosis Date   Abrasion of hand 09/09/2013   bilateral   Anxiety    Arthritis    Asthma    Complication of anesthesia    hair loss after anesthesia   Depression    Dysrhythmia    svt  treated   Elevated ALT measurement 02/05/2018   Esophagitis    GERD (gastroesophageal reflux disease)    OTC as needed   Gestational diabetes    H/O hiatal hernia    High cholesterol    no current med.    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   Weight as of this encounter: 89.4 kg. Advance diet Up with therapy D/C IV fluids  DVT Prophylaxis - Aspirin Weight bearing as tolerated.  Hgb stable at 11.4 this AM.  Plan is to go Home after hospital stay. Plan for discharge today after meeting goals with therapy. Follow up in the office in 2 weeks.   Griffith Citron, PA-C Orthopedic Surgery [phone] 03/23/2022, 8:56 AM

## 2022-03-24 ENCOUNTER — Telehealth: Payer: Self-pay

## 2022-03-24 NOTE — Transitions of Care (Post Inpatient/ED Visit) (Signed)
   03/24/2022  Name: Christine Schneider MRN: [id number] DOB: 1990/09/07  Today's TOC FU Call Status: Today's TOC FU Call Status:: Successful TOC FU Call Competed TOC FU Call Complete Date: 03/24/22  Transition Care Management Follow-up Telephone Call Date of Discharge: 03/23/22 Discharge Facility: [facility]) Type of Discharge: Inpatient Admission Primary Inpatient Discharge Diagnosis:: S/P total right hip arthroplasty How have you been since you were released from the hospital?: Better Any questions or concerns?: No  Items Reviewed: Did you receive and understand the discharge instructions provided?: Yes Medications obtained and verified?: Yes (Medications Reviewed) Any new allergies since your discharge?: No Dietary orders reviewed?: NA Do you have support at home?: Yes  Home Care and Equipment/Supplies: [place] Ordered?: No Any new equipment or medical supplies ordered?: No  Functional Questionnaire: Do you need assistance with bathing/showering or dressing?: No Do you need assistance with meal preparation?: No Do you need assistance with eating?: No Do you have difficulty maintaining continence: No Do you need assistance with getting out of bed/getting out of a chair/moving?: No Do you have difficulty managing or taking your medications?: No  Folllow up appointments reviewed: PCP Follow-up appointment confirmed?: No (specialist) MD Provider Line Number:[phone] Given: Yes [facility] Follow-up appointment confirmed?: Yes Date of Specialist follow-up appointment?: 04/06/22 Follow-Up Specialty Provider:: Dr Alvan Dame Do you need transportation to your follow-up appointment?: No Do you understand care options if your condition(s) worsen?: Yes-patient verbalized understanding    [place] LPN [place] Direct Dial [phone]

## 2022-03-27 ENCOUNTER — Other Ambulatory Visit: Payer: Self-pay

## 2022-03-27 ENCOUNTER — Encounter (HOSPITAL_COMMUNITY): Payer: Self-pay

## 2022-03-27 ENCOUNTER — Emergency Department (HOSPITAL_COMMUNITY)
Admission: EM | Admit: 2022-03-27 | Discharge: 2022-03-27 | Disposition: A | Discharge status: Home / Self Care | Payer: 59 | Attending: Emergency Medicine | Admitting: Emergency Medicine

## 2022-03-27 ENCOUNTER — Emergency Department (HOSPITAL_COMMUNITY): Payer: 59

## 2022-03-27 DIAGNOSIS — Z7982 Long term (current) use of aspirin: Secondary | ICD-10-CM | POA: Diagnosis not present

## 2022-03-27 DIAGNOSIS — R748 Abnormal levels of other serum enzymes: Secondary | ICD-10-CM | POA: Diagnosis not present

## 2022-03-27 DIAGNOSIS — L298 Other pruritus: Secondary | ICD-10-CM | POA: Insufficient documentation

## 2022-03-27 DIAGNOSIS — L299 Pruritus, unspecified: Secondary | ICD-10-CM

## 2022-03-27 LAB — ACETAMINOPHEN LEVEL: Acetaminophen (Tylenol), Serum: <10 ug/mL — ABNORMAL LOW (ref 10–30)

## 2022-03-27 LAB — CBC WITH DIFFERENTIAL/PLATELET
Abs Immature Granulocytes: 0.02 10*3/uL (ref 0.00–0.07)
Basophils Absolute: 0 10*3/uL (ref 0.0–0.1)
Basophils Relative: 0 %
Eosinophils Absolute: 0.2 10*3/uL (ref 0.0–0.5)
Eosinophils Relative: 3 %
HCT: 35.2 % — ABNORMAL LOW (ref 36.0–46.0)
Hemoglobin: 11.4 g/dL — ABNORMAL LOW (ref 12.0–15.0)
Immature Granulocytes: 0 %
Lymphocytes Relative: 25 %
Lymphs Abs: 1.7 10*3/uL (ref 0.7–4.0)
MCH: 30.8 pg (ref 26.0–34.0)
MCHC: 32.4 g/dL (ref 30.0–36.0)
MCV: 95.1 fL (ref 80.0–100.0)
Monocytes Absolute: 0.6 10*3/uL (ref 0.1–1.0)
Monocytes Relative: 9 %
Neutro Abs: 4.1 10*3/uL (ref 1.7–7.7)
Neutrophils Relative %: 63 %
Platelets: 303 10*3/uL (ref 150–400)
RBC: 3.7 MIL/uL — ABNORMAL LOW (ref 3.87–5.11)
RDW: 12 % (ref 11.5–15.5)
WBC: 6.6 10*3/uL (ref 4.0–10.5)
nRBC: 0 % (ref 0.0–0.2)

## 2022-03-27 LAB — COMPREHENSIVE METABOLIC PANEL
ALT: 46 U/L — ABNORMAL HIGH (ref 0–44)
AST: 29 U/L (ref 15–41)
Albumin: 3.1 g/dL — ABNORMAL LOW (ref 3.5–5.0)
Alkaline Phosphatase: 141 U/L — ABNORMAL HIGH (ref 38–126)
Anion gap: 9 (ref 5–15)
BUN: 11 mg/dL (ref 6–20)
CO2: 28 mmol/L (ref 22–32)
Calcium: 8.9 mg/dL (ref 8.9–10.3)
Chloride: 102 mmol/L (ref 98–111)
Creatinine, Ser: 0.69 mg/dL (ref 0.44–1.00)
GFR, Estimated: >60 mL/min (ref >60)
Glucose, Bld: 117 mg/dL — ABNORMAL HIGH (ref 70–99)
Potassium: 4.4 mmol/L (ref 3.5–5.1)
Sodium: 139 mmol/L (ref 135–145)
Total Bilirubin: 0.6 mg/dL (ref 0.3–1.2)
Total Protein: 7 g/dL (ref 6.5–8.1)

## 2022-03-27 LAB — LIPASE, BLOOD: Lipase: 38 U/L (ref 11–51)

## 2022-03-27 LAB — ETHANOL: Alcohol, Ethyl (B): <10 mg/dL (ref <10)

## 2022-03-27 LAB — URINALYSIS, ROUTINE W REFLEX MICROSCOPIC
Glucose, UA: NEGATIVE mg/dL
Hgb urine dipstick: NEGATIVE
Ketones, ur: NEGATIVE mg/dL
Leukocytes,Ua: NEGATIVE
Nitrite: NEGATIVE
Protein, ur: NEGATIVE mg/dL
Specific Gravity, Urine: 1.023 (ref 1.005–1.030)
pH: 5 (ref 5.0–8.0)

## 2022-03-27 LAB — AMMONIA: Ammonia: 21 umol/L (ref 9–35)

## 2022-03-27 LAB — PREGNANCY, URINE: Preg Test, Ur: NEGATIVE

## 2022-03-27 MED ORDER — HYDROXYZINE HCL 25 MG PO TABS: 25.0000 mg | ORAL_TABLET | Freq: Four times a day (QID) | ORAL | 0 refills | Status: DC

## 2022-03-27 MED ORDER — SODIUM CHLORIDE 0.9 % IV BOLUS
1000.0000 mL | Freq: Once | INTRAVENOUS | Status: AC
  Administered 2022-03-27: 1000 mL via INTRAVENOUS

## 2022-03-27 MED ORDER — ONDANSETRON HCL 4 MG/2ML IJ SOLN
4.0000 mg | Freq: Once | INTRAMUSCULAR | Status: AC
  Administered 2022-03-27: 4 mg via INTRAVENOUS
  Filled 2022-03-27: qty 2

## 2022-03-27 MED ORDER — DIPHENHYDRAMINE HCL 50 MG/ML IJ SOLN
25.0000 mg | Freq: Once | INTRAMUSCULAR | Status: AC
  Administered 2022-03-27: 25 mg via INTRAVENOUS
  Filled 2022-03-27: qty 1

## 2022-03-27 MED ORDER — CHOLESTYRAMINE LIGHT 4 G PO PACK
2.0000 g | PACK | Freq: Once | ORAL | Status: AC
  Administered 2022-03-27: 2 g via ORAL
  Filled 2022-03-27: qty 1

## 2022-03-27 NOTE — ED Provider Notes (Signed)
[facility] Provider Note   CSN: [id number] Arrival date & time: 03/27/22  1037     History  Chief Complaint  Patient presents with   Pruritis    MARYPATRICIA TOWN is a 32 y.o. female, history of PCOS, gestational diabetes, who presents to the ED secondary to pruritus, increased thirst, nausea, feeling weak, and dark-colored urine for the last day.  She states that she had a hip replacement on the right hip, about 5 days ago, and it went well, and she has been taking some Tylenol and oxycodone for pain control.  She notes that she was doing well until last night, when she started feeling off, and unwell, nausea started, and then she woke up this morning scratching all over.  She states she has a rash all over her body, and her urine was dark orange today.  She states she has had issues with her liver in the past, and she wants to get it checked out.  She denies any IV drugs, alcoholism, or high doses of Tylenol.  Denies any fevers, chills, states that she does have a little upper abdominal pain, but no other complaints.  Abdominal pain is just a twinge.  No vomiting or diarrhea.  Last bowel movement this morning.    Home Medications Prior to Admission medications   Medication Sig Start Date End Date Taking? Authorizing Provider  hydrOXYzine (ATARAX) 25 MG tablet Take 1 tablet (25 mg total) by mouth every 6 (six) hours. 03/27/22  Yes Lenee Franze L, PA  acetaminophen (TYLENOL) 500 MG tablet Take 2 tablets (1,000 mg total) by mouth every 6 (six) hours. 03/23/22   Irving Copas, PA-C  albuterol (VENTOLIN HFA) 108 (90 Base) MCG/ACT inhaler Inhale 2 puffs into the lungs every 6 (six) hours as needed for wheezing or shortness of breath. 04/15/19   Julian Hy, DO  amphetamine-dextroamphetamine (ADDERALL XR) 20 MG 24 hr capsule Take 20 mg by mouth in the morning.    [provider]  aspirin 81 MG chewable tablet Chew 1 tablet (81 mg total)  by mouth 2 (two) times daily for 28 days. 03/23/22 04/20/22  Irving Copas, PA-C  celecoxib (CELEBREX) 200 MG capsule Take 1 capsule (200 mg total) by mouth 2 (two) times daily. 03/23/22   Irving Copas, PA-C  DULoxetine (CYMBALTA) 30 MG capsule Take 30 mg by mouth See admin instructions. Take with 60 mg for a total of 90 mg in the morning    [provider]  DULoxetine (CYMBALTA) 60 MG capsule Take 60 mg by mouth See admin instructions. Take with 30 mg for a total of 90 mg in the morning    [provider]  ESTARYLLA 0.25-35 MG-MCG tablet Take 1 tablet by mouth daily. 02/26/22   [provider]  gabapentin (NEURONTIN) 300 MG capsule Take 300 mg by mouth 2 (two) times daily.    [provider]  levocetirizine (XYZAL) 5 MG tablet Take 5 mg by mouth every evening.    [provider]  metoprolol succinate (TOPROL-XL) 50 MG 24 hr tablet Take 50 mg by mouth 2 (two) times daily.    [provider]  Multiple Vitamins-Minerals (MULTIVITAMIN WITH MINERALS) tablet Take 1 tablet by mouth daily.    [provider]  Omega-3 Fatty Acids (FISH OIL) 1000 MG CAPS Take 2,000 mg by mouth at bedtime.    [provider]  ondansetron (ZOFRAN) 4 MG tablet Take 1  tablet (4 mg total) by mouth every 6 (six) hours as needed for nausea. 03/23/22   Irving Copas, PA-C  oxyCODONE (OXY IR/ROXICODONE) 5 MG immediate release tablet Take 1 tablet (5 mg total) by mouth every 4 (four) hours as needed for severe pain. 03/23/22   Irving Copas, PA-C  polyethylene glycol (MIRALAX / GLYCOLAX) 17 g packet Take 17 g by mouth 2 (two) times daily. 03/23/22   Irving Copas, PA-C  RESVERATROL PO Take 2 tablets by mouth daily. Gelcap    [provider]  senna (SENOKOT) 8.6 MG TABS tablet Take 1 tablet (8.6 mg total) by mouth at bedtime for 14 days. 03/23/22 04/06/22  Irving Copas, PA-C  tiZANidine (ZANAFLEX) 2 MG tablet Take 1 tablet (2 mg total) by  mouth every 8 (eight) hours as needed for muscle spasms. 03/23/22   Irving Copas, PA-C  traZODone (DESYREL) 50 MG tablet Take 50 mg by mouth at bedtime. 01/17/22   [provider]      Allergies    Oxycodone-acetaminophen and Hydromorphone    Review of Systems   Review of Systems  Gastrointestinal:  Positive for abdominal pain and nausea. Negative for vomiting.    Physical Exam Updated Vital Signs BP 133/84   Pulse 82   Temp 99.1 F (37.3 C) (Oral)   Resp 18   Ht '5\' 5"'$  (1.651 m)   Wt 88.5 kg   LMP 03/01/2022   SpO2 99%   BMI 32.45 kg/m  Physical Exam Vitals and nursing note reviewed.  Constitutional:      General: She is not in acute distress.    Appearance: She is well-developed.  HENT:     Head: Normocephalic and atraumatic.  Eyes:     Conjunctiva/sclera: Conjunctivae normal.  Cardiovascular:     Rate and Rhythm: Normal rate and regular rhythm.     Heart sounds: No murmur heard. Pulmonary:     Effort: Pulmonary effort is normal. No respiratory distress.     Breath sounds: Normal breath sounds.  Abdominal:     Palpations: Abdomen is soft.     Tenderness: There is abdominal tenderness in the epigastric area. There is no guarding or rebound.  Musculoskeletal:        General: No swelling.     Cervical back: Neck supple.  Skin:    General: Skin is warm and dry.     Capillary Refill: Capillary refill takes less than 2 seconds.     Comments: Erythematous patches all over body, with some excoriation.  Healing incision to right thigh, with surgical bandage over it.  No erythema edema or warmth.  Neurological:     Mental Status: She is alert.  Psychiatric:        Mood and Affect: Mood normal.     ED Results / Procedures / Treatments   Labs (all labs ordered are listed, but only abnormal results are displayed) Labs Reviewed  CBC WITH DIFFERENTIAL/PLATELET - Abnormal; Notable for the following components:      Result Value   RBC 3.70 (*)     Hemoglobin 11.4 (*)    HCT 35.2 (*)    All other components within normal limits  COMPREHENSIVE METABOLIC PANEL - Abnormal; Notable for the following components:   Glucose, Bld 117 (*)    Albumin 3.1 (*)    ALT 46 (*)    Alkaline Phosphatase 141 (*)    All other components within normal limits  URINALYSIS, ROUTINE W REFLEX  MICROSCOPIC - Abnormal; Notable for the following components:   Color, Urine AMBER (*)    Bilirubin Urine Patty Lopezgarcia (*)    All other components within normal limits  ACETAMINOPHEN LEVEL - Abnormal; Notable for the following components:   Acetaminophen (Tylenol), Serum <10 (*)    All other components within normal limits  LIPASE, BLOOD  PREGNANCY, URINE  ETHANOL  AMMONIA    EKG None  Radiology US Abdomen Limited RUQ (LIVER/GB)  Result Date: 03/27/2022 CLINICAL DATA:  Right upper quadrant pain.  Elevated LFTs. EXAM: ULTRASOUND ABDOMEN LIMITED RIGHT UPPER QUADRANT COMPARISON:  None Available. FINDINGS: Gallbladder: Surgically absent Common bile duct: Diameter: 5 mm Liver: No focal lesion identified. Within normal limits in parenchymal echogenicity. Portal vein is patent on color Doppler imaging with normal direction of blood flow towards the liver. Other: None. IMPRESSION: The gallbladder is surgically absent. The study is otherwise normal. Electronically Signed   By: Dorise Bullion III M.D.   On: 03/27/2022 14:12    Procedures Procedures    Medications Ordered in ED Medications  cholestyramine light (PREVALITE) packet 2 g (has no administration in time range)  ondansetron (ZOFRAN) injection 4 mg (4 mg Intravenous Given 03/27/22 1245)  sodium chloride 0.9 % bolus 1,000 mL (0 mLs Intravenous Stopped 03/27/22 1516)  diphenhydrAMINE (BENADRYL) injection 25 mg (25 mg Intravenous Given 03/27/22 1245)    ED Course/ Medical Decision Making/ A&P                             Medical Decision Making Patient is a 32 year old female, here for dark urine, abdominal  discomfort, pruritus, for the last day and a half.  States she had surgery about 5 days ago, and that last year when she had surgery this happen again.  Has only been using little bit of Tylenol, not much Hu obtain labs, including ammonia, Tylenol level, further evaluation.  As well give her fluids, and Benadryl.  Abdominal exam is fairly nonspecific, mild tenderness to palpation of epigastric area.  Amount and/or Complexity of Data Reviewed Labs: ordered.    Details: Mild LFT elevation of the ALT, alk phos, twice is much as 2 months ago. Radiology: ordered.    Details: Ultrasound unremarkable. Discussion of management or test interpretation with external provider(s): Discussed with patient, she is overall well-appearing, just has pruritus, we attempted Benadryl without relief.  Right upper quadrant was unremarkable, labs show mild elevated ALT, alk phos.  I spoke to GI Anderson Malta from [facility], and discussed possible use of cholestyramine, she recommends only one-time use of this as she it will cause some constipation, and she is a postsurgical patient.  1 dose ordered.  I encouraged follow-up for PCP, and referred her to a different GI service per her request.  Additionally if this persist, she may need to see hematologist.  Return precautions emphasized.  She is well-appearing upon discharge.  Risk Prescription drug management.   Final Clinical Impression(s) / ED Diagnoses Final diagnoses:  Pruritus  Elevated alkaline phosphatase level    Rx / DC Orders ED Discharge Orders          Ordered    hydrOXYzine (ATARAX) 25 MG tablet  Every 6 hours        03/27/22 1504              Dusty Raczkowski, [place], [place] 03/27/22 1523    Ezequiel Essex, MD 03/27/22 1701

## 2022-03-27 NOTE — ED Triage Notes (Signed)
Pt presents to ED from C/O itching, nausea, dark colored urine X 2 days. Pt reports R hip replacement last Tuesday. Denies falls. Pt reports the last time she had these symptoms her liver enzymes were elevated.

## 2022-03-27 NOTE — Discharge Instructions (Addendum)
Please follow-up with your primary care doctor, and follow-up with GI, I have provided you information for [facility] as you have not had a good experience with [facility].  Additionally if you start having any kind of severe abdominal pain, your eyes start turning yellow, or your skin becomes yellow, please return to the ER.  Your liver enzyme tests were mildly elevated today, and this may be the cause of your itching.  You can use a menthol cream to help with some of the itching.

## 2022-03-30 NOTE — Discharge Summary (Signed)
Patient ID: Christine Schneider MRN: [id number] DOB/AGE: November 22, 1990 32 y.o.  Admit date: 03/22/2022 Discharge date: 03/23/2022  Admission Diagnoses:  Right hip osteoarthritis  Discharge Diagnoses:  Principal Problem:   S/P total right hip arthroplasty   Past Medical History:  Diagnosis Date   Abrasion of hand 09/09/2013   bilateral   Anxiety    Arthritis    Asthma    Complication of anesthesia    hair loss after anesthesia   Depression    Dysrhythmia    svt  treated   Elevated ALT measurement 02/05/2018   Esophagitis    GERD (gastroesophageal reflux disease)    OTC as needed   Gestational diabetes    H/O hiatal hernia    High cholesterol    no current med.   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 female who was admitted 03/22/2022 for operative treatment ofS/P total right hip arthroplasty. Patient has severe unremitting pain that affects sleep, daily activities, and work/hobbies. After pre-op clearance the patient was taken to the operating room on 03/22/2022 and underwent  Procedure(s): TOTAL HIP ARTHROPLASTY ANTERIOR APPROACH.    Patient was given perioperative antibiotics:  Anti-infectives (From admission, onward)    Start     Dose/Rate Route Frequency Ordered Stop   03/22/22 2030  ceFAZolin (ANCEF) IVPB 2g/100 mL premix         2 g 200 mL/hr over 30 Minutes Intravenous Every 6 hours 03/22/22 1808 03/23/22 0416   03/22/22 1215  ceFAZolin (ANCEF) IVPB 2g/100 mL premix        2 g 200 mL/hr over 30 Minutes Intravenous On call to O.R. 03/22/22 1210 03/22/22 1456        Patient was given sequential compression devices, early ambulation, and chemoprophylaxis to prevent DVT. Patient worked with PT and was meeting their goals regarding safe ambulation and transfers.  Patient benefited maximally from hospital stay and there were no complications.    Recent vital signs: No data found.   Recent laboratory studies:  Recent Labs    03/27/22 1128  WBC 6.6  HGB 11.4*  HCT 35.2*  PLT 303  NA 139  K 4.4  CL 102  CO2 28  BUN 11  CREATININE 0.69  GLUCOSE 117*  CALCIUM 8.9     Discharge Medications:   Allergies as of 03/23/2022       Reactions   Oxycodone-acetaminophen Nausea And Vomiting   Must get Zofran or Phenergan before   Hydromorphone Itching, Other (See Comments)        Medication List     TAKE these medications    acetaminophen 500 MG tablet Commonly known as: TYLENOL Take 2 tablets (1,000 mg total) by mouth every 6 (six) hours.   albuterol 108 (90 Base) MCG/ACT inhaler Commonly  known as: VENTOLIN HFA Inhale 2 puffs into the lungs every 6 (six) hours as needed for wheezing or shortness of breath.   amphetamine-dextroamphetamine 20 MG 24 hr capsule Commonly known as: ADDERALL XR Take 20 mg by mouth in the morning.   aspirin 81 MG chewable tablet Chew 1 tablet (81 mg total) by mouth 2 (two) times daily for 28 days.   celecoxib 200 MG capsule Commonly known as: CELEBREX Take 1 capsule (200 mg total) by mouth 2 (two) times daily.   DULoxetine 30 MG capsule Commonly known as: CYMBALTA Take 30 mg by mouth See admin instructions. Take with 60 mg for a total of 90 mg in the morning   DULoxetine 60 MG capsule Commonly known as: CYMBALTA Take 60 mg by mouth See admin instructions. Take  with 30 mg for a total of 90 mg in the morning   Estarylla 0.25-35 MG-MCG tablet Generic drug: norgestimate-ethinyl estradiol Take 1 tablet by mouth daily.   Fish Oil 1000 MG Caps Take 2,000 mg by mouth at bedtime.   gabapentin 300 MG capsule Commonly known as: NEURONTIN Take 300 mg by mouth 2 (two) times daily.   levocetirizine 5 MG tablet Commonly known as: XYZAL Take 5 mg by mouth every evening.   metoprolol succinate 50 MG 24 hr tablet Commonly known as: TOPROL-XL Take 50 mg by mouth 2 (two) times daily.   multivitamin with minerals tablet Take 1 tablet by mouth daily.   ondansetron 4 MG tablet Commonly known as: ZOFRAN Take 1 tablet (4 mg total) by mouth every 6 (six) hours as needed for nausea.   oxyCODONE 5 MG immediate release tablet Commonly known as: Oxy IR/ROXICODONE Take 1 tablet (5 mg total) by mouth every 4 (four) hours as needed for severe pain.   polyethylene glycol 17 g packet Commonly known as: MIRALAX / GLYCOLAX Take 17 g by mouth 2 (two) times daily.   RESVERATROL PO Take 2 tablets by mouth daily. Gelcap   senna 8.6 MG Tabs tablet Commonly known as: SENOKOT Take 1 tablet (8.6 mg total) by mouth at bedtime for 14 days.   tiZANidine 2 MG tablet Commonly known as: ZANAFLEX Take 1 tablet (2 mg total) by mouth every 8 (eight) hours as needed for muscle spasms.   traZODone 50 MG tablet Commonly known as: DESYREL Take 50 mg by mouth at bedtime.               Discharge Care Instructions  (From admission, onward)           Start     Ordered   03/23/22 0000  Change dressing       Comments: Maintain surgical dressing until follow up in the clinic. If the edges start to pull up, may reinforce with tape. If the dressing is no longer working, may remove and cover with gauze and tape, but must keep the area dry and clean.  Call with any questions or concerns.   03/23/22 0900            Diagnostic Studies: US Abdomen Limited RUQ  (LIVER/GB)  Result Date: 03/27/2022 CLINICAL DATA:  Right upper quadrant pain.  Elevated LFTs. EXAM: ULTRASOUND ABDOMEN LIMITED RIGHT UPPER QUADRANT COMPARISON:  None Available. FINDINGS: Gallbladder: Surgically absent Common bile duct: Diameter: 5 mm Liver: No focal lesion identified. Within normal limits in parenchymal echogenicity. Portal vein is patent on color Doppler imaging with normal direction of blood flow towards the liver. Other: None. IMPRESSION: The gallbladder is  surgically absent. The study is otherwise normal. Electronically Signed   By: Dorise Bullion III M.D.   On: 03/27/2022 14:12   DG Pelvis Portable  Result Date: 03/22/2022 CLINICAL DATA:  Status post right hip surgery. EXAM: PORTABLE PELVIS 1-2 VIEWS COMPARISON:  MRI right hip 11/14/2015 FINDINGS: Interval total right hip arthroplasty. No perihardware lucency is seen to indicate hardware failure or loosening. The left femoroacetabular joint space is maintained. The bilateral sacroiliac joint spaces are maintained. Mild pubic symphysis joint space narrowing with inferior subchondral sclerosis amounts appear degenerative spurring. Expected postoperative subcutaneous air lateral to the right hip. IMPRESSION: Interval total right hip arthroplasty without evidence of hardware failure. Mild osteitis pubis. Electronically Signed   By: Yvonne Kendall M.D.   On: 03/22/2022 16:50   DG HIP PORT UNILAT WITH PELVIS 1V RIGHT  Result Date: 03/22/2022 CLINICAL DATA:  Status post total right hip arthroplasty. Intraoperative fluoroscopy. EXAM: DG HIP (WITH OR WITHOUT PELVIS) 1V PORT RIGHT COMPARISON:  MRI right hip 11/14/2015 FINDINGS: Images were performed intraoperatively without the presence of a radiologist. The patient is undergoing total right hip arthroplasty. No hardware complication is seen. Total fluoroscopy images: 2 Total fluoroscopy time: 14 seconds Total dose: Radiation Exposure Index (as provided by the fluoroscopic device): 2.382 mGy  air Kerma Please see intraoperative findings for further detail. IMPRESSION: Intraoperative fluoroscopy for total right hip arthroplasty. Electronically Signed   By: Yvonne Kendall M.D.   On: 03/22/2022 16:48   DG C-Arm 1-60 Min-No Report  Result Date: 03/22/2022 Fluoroscopy was utilized by the requesting physician.  No radiographic interpretation.   DG C-Arm 1-60 Min-No Report  Result Date: 03/22/2022 Fluoroscopy was utilized by the requesting physician.  No radiographic interpretation.    Disposition: Discharge disposition: 01-Home or Self Care       Discharge Instructions     Call MD / Call 911   Complete by: As directed    If you experience chest pain or shortness of breath, CALL 911 and be transported to the hospital emergency room.  If you develope a fever above 101 F, pus (white drainage) or increased drainage or redness at the wound, or calf pain, call your surgeon's office.   Change dressing   Complete by: As directed    Maintain surgical dressing until follow up in the clinic. If the edges start to pull up, may reinforce with tape. If the dressing is no longer working, may remove and cover with gauze and tape, but must keep the area dry and clean.  Call with any questions or concerns.   Constipation Prevention   Complete by: As directed    Drink plenty of fluids.  Prune juice may be helpful.  You may use a stool softener, such as Colace (over the counter) 100 mg twice a day.  Use MiraLax (over the counter) for constipation as needed.   Diet - low sodium heart healthy   Complete by: As directed    Increase activity slowly as tolerated   Complete by: As directed    Weight bearing as tolerated with assist device (walker, cane, etc) as directed, use it as long as suggested by your surgeon or therapist, typically at least 4-6 weeks.   Post-operative opioid taper instructions:   Complete by: As directed    POST-OPERATIVE OPIOID TAPER INSTRUCTIONS: It is important to wean off of  your opioid medication as soon as possible. If you do not need pain medication after your surgery it is ok to stop day  one. Opioids include: Codeine, Hydrocodone(Norco, Vicodin), Oxycodone(Percocet, oxycontin) and hydromorphone amongst others.  Long term and even short term use of opiods can cause: Increased pain response Dependence Constipation Depression Respiratory depression And more.  Withdrawal symptoms can include Flu like symptoms Nausea, vomiting And more Techniques to manage these symptoms Hydrate well Eat regular healthy meals Stay active Use relaxation techniques(deep breathing, meditating, yoga) Do Not substitute Alcohol to help with tapering If you have been on opioids for less than two weeks and do not have pain than it is ok to stop all together.  Plan to wean off of opioids This plan should start within one week post op of your joint replacement. Maintain the same interval or time between taking each dose and first decrease the dose.  Cut the total daily intake of opioids by one tablet each day Next start to increase the time between doses. The last dose that should be eliminated is the evening dose.      TED hose   Complete by: As directed    Use stockings (TED hose) for 2 weeks on both leg(s).  You may remove them at night for sleeping.        Follow-up Information     Paralee Cancel, MD. Schedule an appointment as soon as possible for a visit in 2 week(s).   Specialty: Orthopedic Surgery Contact information: [address] [place] [address] [id number]                  Signed: Irving Copas 03/30/2022, 9:02 AM

## 2022-04-15 ENCOUNTER — Ambulatory Visit (INDEPENDENT_AMBULATORY_CARE_PROVIDER_SITE_OTHER): Payer: 59 | Admitting: Nurse Practitioner

## 2022-04-15 ENCOUNTER — Encounter: Payer: Self-pay | Admitting: Nurse Practitioner

## 2022-04-15 VITALS — BP 108/62 | HR 81 | Wt 208.4 lb

## 2022-04-15 DIAGNOSIS — R768 Other specified abnormal immunological findings in serum: Secondary | ICD-10-CM | POA: Diagnosis not present

## 2022-04-15 DIAGNOSIS — R8290 Unspecified abnormal findings in urine: Secondary | ICD-10-CM | POA: Diagnosis not present

## 2022-04-15 DIAGNOSIS — Z8719 Personal history of other diseases of the digestive system: Secondary | ICD-10-CM

## 2022-04-15 DIAGNOSIS — R822 Biliuria: Secondary | ICD-10-CM

## 2022-04-15 DIAGNOSIS — R7401 Elevation of levels of liver transaminase levels: Secondary | ICD-10-CM | POA: Diagnosis not present

## 2022-04-15 DIAGNOSIS — L299 Pruritus, unspecified: Secondary | ICD-10-CM

## 2022-04-15 LAB — POCT URINALYSIS DIP (CLINITEK)
Blood, UA: NEGATIVE
Glucose, UA: NEGATIVE mg/dL
Leukocytes, UA: NEGATIVE
Nitrite, UA: NEGATIVE
POC PROTEIN,UA: NEGATIVE
Spec Grav, UA: >=1.03 — AB (ref 1.010–1.025)
Urobilinogen, UA: 0.2 E.U./dL
pH, UA: 6 (ref 5.0–8.0)

## 2022-04-15 NOTE — Patient Instructions (Addendum)
  [facility] [address]  [place]  [place], [place] [address]   [phone] (Work)  [phone] (Fax)

## 2022-04-15 NOTE — Progress Notes (Unsigned)
Orma Render, DNP, AGNP-c [place], [place] [address] [phone]  Subjective:   Christine Schneider is a 32 y.o. female presents to day for evaluation of:  Christine Schneider presents today with chief complaints of experiencing dark urine, the presence of bilirubin in her urine, and a distinct itching sensation with recent ED visit for the same.  She reports a history of liver malfunction over the past three years, with no identified cause despite evaluation by a gastroenterologist. The patient describes the itching sensation as unique and not akin to hives or typical allergic reactions. She recalls waking up at 2 AM on Saturday with this sensation and observing orange urine, alongside feelings of being unwell and nauseous. These symptoms were initially thought to be related to recent surgery and medications. An ER doctor has suggested the possibility of hemolytic anemia as a stress response per the patient. Her medical history includes an ALT level once reaching the 1300s, a positive ANA, slightly elevated IgG antibodies, and a speckled ANA pattern. She also has a history of fatty liver, which showed improvement with dietary changes, with subsequent ultrasounds returning normal results. She has undergone a liver biopsy, which returned clean.  The patient has previously been treated with prednisone and dexamethasone injections for upper respiratory issues, but notes no recent use. She has ceased taking Tylenol with oxycodone post-surgery, confirming the absence of acetaminophen in her system during hospital tests. The patient denies any alcohol consumption or recreational drug use. She notes experiencing itching and nausea when liver values are slightly elevated, though her gastroenterologist does not believe these symptoms are linked to the liver values.  The patient expresses a desire for an A1C test, citing high morning blood sugar levels and a history of  gestational diabetes.   PMH, Medications, and Allergies reviewed and updated in chart as appropriate.   ROS negative except for what is listed in HPI. Objective:  BP 108/62   Pulse 81   Wt 208 lb 6.4 oz (94.5 kg)   LMP 03/01/2022   BMI 34.68 kg/m  Physical Exam Vitals and nursing note reviewed.  Constitutional:      Appearance: Normal appearance.  HENT:     Head: Normocephalic.  Eyes:     General: No scleral icterus.    Pupils: Pupils are equal, round, and reactive to light.  Neck:     Vascular: No carotid bruit.  Cardiovascular:     Rate and Rhythm: Normal rate and regular rhythm.     Pulses: Normal pulses.     Heart sounds: Normal heart sounds.  Pulmonary:     Effort: Pulmonary effort is normal.     Breath sounds: Normal breath sounds.  Abdominal:     General: Bowel sounds are normal. There is no distension.     Palpations: Abdomen is soft.     Tenderness: There is abdominal tenderness. There is no right CVA tenderness, left CVA tenderness or guarding.  Musculoskeletal:        General: Normal range of motion.  Lymphadenopathy:     Cervical: No cervical adenopathy.  Skin:    General: Skin is warm and dry.     Capillary Refill: Capillary refill takes less than 2 seconds.     Coloration: Skin is not jaundiced.  Neurological:     General: No focal deficit present.     Mental Status: She is alert.  Psychiatric:        Mood and Affect: Mood normal.  Assessment & Plan:   Problem List Items Addressed This Visit     Elevated ALT measurement - Primary    Christine Schneider has a history of elevated liver enzymes, bilirubinuria, pruritus, and nausea with recent ED visit. Positive ANA and slightly elevated IgG antibodies are noted, alongside a history of resolved fatty liver through dietary changes. At this time, no diagnosis has been established for recurrent elevation in her liver studies or the presence of pruritus. Evaluation today shows no evidence of jaundice or  GI distention. I am concerned for the possibility of medication or autoimmune induced hepatitis given no clear evidence of other cause.  Plan: - Continue to monitor liver function tests (LFTs), including ALT and bilirubin levels. - Referral to hepatology for comprehensive evaluation and management. I know of a specialist with [facility]/  - Additional labs ordered for in-depth evaluation: CBC, CMP, lactate dehydrogenase, ferritin, anti-mitochondrial antibody, and gamma-GT.      Relevant Orders   Amb Referral to Hepatology   CBC with Differential/Platelet (Completed)   Iron, TIBC and Ferritin Panel (Completed)   Lactate Dehydrogenase (Completed)   HAV, HBV, HCV (Completed)   Alkaline phosphatase, isoenzymes (Completed)   Hemoglobin A1c (Completed)   Gamma GT (Completed)   History of fatty infiltration of liver   Relevant Orders   Amb Referral to Hepatology   CBC with Differential/Platelet (Completed)   Iron, TIBC and Ferritin Panel (Completed)   Lactate Dehydrogenase (Completed)   HAV, HBV, HCV (Completed)   Alkaline phosphatase, isoenzymes (Completed)   Hemoglobin A1c (Completed)   Gamma GT (Completed)   Other Visit Diagnoses     Abnormal urine odor       Relevant Orders   POCT URINALYSIS DIP (CLINITEK) (Completed)   Amb Referral to Hepatology   CBC with Differential/Platelet (Completed)   Iron, TIBC and Ferritin Panel (Completed)   Lactate Dehydrogenase (Completed)   HAV, HBV, HCV (Completed)   Alkaline phosphatase, isoenzymes (Completed)   Hemoglobin A1c (Completed)   Gamma GT (Completed)   Positive ANA (antinuclear antibody)       Relevant Orders   Amb Referral to Hepatology   CBC with Differential/Platelet (Completed)   Iron, TIBC and Ferritin Panel (Completed)   Lactate Dehydrogenase (Completed)   HAV, HBV, HCV (Completed)   Alkaline phosphatase, isoenzymes (Completed)   Hemoglobin A1c (Completed)   Gamma GT (Completed)   Bilirubinuria       Relevant Orders    Amb Referral to Hepatology   CBC with Differential/Platelet (Completed)   Iron, TIBC and Ferritin Panel (Completed)   Lactate Dehydrogenase (Completed)   HAV, HBV, HCV (Completed)   Alkaline phosphatase, isoenzymes (Completed)   Hemoglobin A1c (Completed)   Gamma GT (Completed)   Pruritic condition       Relevant Orders   Amb Referral to Hepatology   CBC with Differential/Platelet (Completed)   Iron, TIBC and Ferritin Panel (Completed)   Lactate Dehydrogenase (Completed)   HAV, HBV, HCV (Completed)   Alkaline phosphatase, isoenzymes (Completed)   Hemoglobin A1c (Completed)   Gamma GT (Completed)         Orma Render, DNP, AGNP-c 04/21/2022  8:04 PM    History, Medications, Surgery, SDOH, and Family History reviewed and updated as appropriate.

## 2022-04-17 ENCOUNTER — Encounter: Payer: Self-pay | Admitting: Nurse Practitioner

## 2022-04-21 NOTE — Assessment & Plan Note (Signed)
Christine Schneider has a history of elevated liver enzymes, bilirubinuria, pruritus, and nausea with recent ED visit. Positive ANA and slightly elevated IgG antibodies are noted, alongside a history of resolved fatty liver through dietary changes. At this time, no diagnosis has been established for recurrent elevation in her liver studies or the presence of pruritus. Evaluation today shows no evidence of jaundice or GI distention. I am concerned for the possibility of medication or autoimmune induced hepatitis given no clear evidence of other cause.  Plan: - Continue to monitor liver function tests (LFTs), including ALT and bilirubin levels. - Referral to hepatology for comprehensive evaluation and management. I know of a specialist with [facility]/  - Additional labs ordered for in-depth evaluation: CBC, CMP, lactate dehydrogenase, ferritin, anti-mitochondrial antibody, and gamma-GT.

## 2022-04-22 LAB — CBC WITH DIFFERENTIAL/PLATELET
Basophils Absolute: 0 10*3/uL (ref 0.0–0.2)
Basos: 1 %
EOS (ABSOLUTE): 0.2 10*3/uL (ref 0.0–0.4)
Eos: 3 %
Hematocrit: 36.1 % (ref 34.0–46.6)
Hemoglobin: 12.2 g/dL (ref 11.1–15.9)
Immature Grans (Abs): 0 10*3/uL (ref 0.0–0.1)
Immature Granulocytes: 0 %
Lymphocytes Absolute: 1.9 10*3/uL (ref 0.7–3.1)
Lymphs: 31 %
MCH: 31.4 pg (ref 26.6–33.0)
MCHC: 33.8 g/dL (ref 31.5–35.7)
MCV: 93 fL (ref 79–97)
Monocytes Absolute: 0.4 10*3/uL (ref 0.1–0.9)
Monocytes: 7 %
Neutrophils Absolute: 3.5 10*3/uL (ref 1.4–7.0)
Neutrophils: 58 %
Platelets: 456 10*3/uL — ABNORMAL HIGH (ref 150–450)
RBC: 3.88 x10E6/uL (ref 3.77–5.28)
RDW: 11.6 % — ABNORMAL LOW (ref 11.7–15.4)
WBC: 6 10*3/uL (ref 3.4–10.8)

## 2022-04-22 LAB — HEPATITIS A ANTIBODY, IGM: Hep A IgM: NEGATIVE

## 2022-04-22 LAB — HCV INTERPRETATION

## 2022-04-22 LAB — IRON,TIBC AND FERRITIN PANEL
Ferritin: 22 ng/mL (ref 15–150)
Iron Saturation: 11 % — ABNORMAL LOW (ref 15–55)
Iron: 58 ug/dL (ref 27–159)
Total Iron Binding Capacity: 548 ug/dL — ABNORMAL HIGH (ref 250–450)
UIBC: 490 ug/dL — ABNORMAL HIGH (ref 131–425)

## 2022-04-22 LAB — ALKALINE PHOSPHATASE, ISOENZYMES
Alkaline Phosphatase: 259 IU/L — ABNORMAL HIGH (ref 44–121)
BONE FRACTION: 20 % (ref 14–68)
INTESTINAL FRAC.: 0 % (ref 0–18)
LIVER FRACTION: 80 % (ref 18–85)

## 2022-04-22 LAB — GAMMA GT: GGT: 33 IU/L (ref 0–60)

## 2022-04-22 LAB — HAV, HBV, HCV
HCV Ab: NONREACTIVE
Hep B Core Total Ab: NEGATIVE
Hep B Surface Ab, Qual: REACTIVE
Hepatitis B Surface Ag: NEGATIVE
hep A Total Ab: POSITIVE — AB

## 2022-04-22 LAB — HEMOGLOBIN A1C
Est. average glucose Bld gHb Est-mCnc: 117 mg/dL
Hgb A1c MFr Bld: 5.7 % — ABNORMAL HIGH (ref 4.8–5.6)

## 2022-04-22 LAB — LACTATE DEHYDROGENASE: LDH: 153 IU/L (ref 119–226)

## 2022-05-05 ENCOUNTER — Other Ambulatory Visit: Payer: 59

## 2022-05-05 ENCOUNTER — Other Ambulatory Visit: Payer: Self-pay | Admitting: Nurse Practitioner

## 2022-05-05 DIAGNOSIS — I4719 Other supraventricular tachycardia: Secondary | ICD-10-CM

## 2022-05-05 DIAGNOSIS — R7401 Elevation of levels of liver transaminase levels: Secondary | ICD-10-CM

## 2022-05-05 DIAGNOSIS — Z8679 Personal history of other diseases of the circulatory system: Secondary | ICD-10-CM

## 2022-05-05 DIAGNOSIS — Z8719 Personal history of other diseases of the digestive system: Secondary | ICD-10-CM

## 2022-05-05 DIAGNOSIS — E782 Mixed hyperlipidemia: Secondary | ICD-10-CM

## 2022-05-05 DIAGNOSIS — I7300 Raynaud's syndrome without gangrene: Secondary | ICD-10-CM

## 2022-05-05 DIAGNOSIS — R5382 Chronic fatigue, unspecified: Secondary | ICD-10-CM

## 2022-05-06 LAB — COMPREHENSIVE METABOLIC PANEL
ALT: 23 IU/L (ref 0–32)
AST: 17 IU/L (ref 0–40)
Albumin/Globulin Ratio: 1.4 (ref 1.2–2.2)
Albumin: 3.9 g/dL (ref 3.9–4.9)
Alkaline Phosphatase: 164 IU/L — ABNORMAL HIGH (ref 44–121)
BUN/Creatinine Ratio: 13 (ref 9–23)
BUN: 10 mg/dL (ref 6–20)
Bilirubin Total: <0.2 mg/dL (ref 0.0–1.2)
CO2: 24 mmol/L (ref 20–29)
Calcium: 9.1 mg/dL (ref 8.7–10.2)
Chloride: 102 mmol/L (ref 96–106)
Creatinine, Ser: 0.76 mg/dL (ref 0.57–1.00)
Globulin, Total: 2.8 g/dL (ref 1.5–4.5)
Glucose: 113 mg/dL — ABNORMAL HIGH (ref 70–99)
Potassium: 4.3 mmol/L (ref 3.5–5.2)
Sodium: 140 mmol/L (ref 134–144)
Total Protein: 6.7 g/dL (ref 6.0–8.5)
eGFR: 107 mL/min/{1.73_m2} (ref >59)

## 2022-05-06 LAB — CBC WITH DIFFERENTIAL/PLATELET
Basophils Absolute: 0.1 10*3/uL (ref 0.0–0.2)
Basos: 1 %
EOS (ABSOLUTE): 0.2 10*3/uL (ref 0.0–0.4)
Eos: 3 %
Hematocrit: 38.2 % (ref 34.0–46.6)
Hemoglobin: 12.8 g/dL (ref 11.1–15.9)
Immature Grans (Abs): 0 10*3/uL (ref 0.0–0.1)
Immature Granulocytes: 0 %
Lymphocytes Absolute: 1.5 10*3/uL (ref 0.7–3.1)
Lymphs: 17 %
MCH: 30.5 pg (ref 26.6–33.0)
MCHC: 33.5 g/dL (ref 31.5–35.7)
MCV: 91 fL (ref 79–97)
Monocytes Absolute: 0.5 10*3/uL (ref 0.1–0.9)
Monocytes: 6 %
Neutrophils Absolute: 6.5 10*3/uL (ref 1.4–7.0)
Neutrophils: 73 %
Platelets: 388 10*3/uL (ref 150–450)
RBC: 4.19 x10E6/uL (ref 3.77–5.28)
RDW: 12.4 % (ref 11.7–15.4)
WBC: 8.9 10*3/uL (ref 3.4–10.8)

## 2022-05-06 LAB — IRON,TIBC AND FERRITIN PANEL
Ferritin: 18 ng/mL (ref 15–150)
Iron Saturation: 18 % (ref 15–55)
Iron: 90 ug/dL (ref 27–159)
Total Iron Binding Capacity: 491 ug/dL — ABNORMAL HIGH (ref 250–450)
UIBC: 401 ug/dL (ref 131–425)

## 2022-07-05 ENCOUNTER — Other Ambulatory Visit: Payer: Self-pay | Admitting: Nurse Practitioner

## 2022-07-05 DIAGNOSIS — L299 Pruritus, unspecified: Secondary | ICD-10-CM

## 2022-07-05 DIAGNOSIS — R748 Abnormal levels of other serum enzymes: Secondary | ICD-10-CM

## 2022-07-05 DIAGNOSIS — K760 Fatty (change of) liver, not elsewhere classified: Secondary | ICD-10-CM

## 2022-07-24 ENCOUNTER — Ambulatory Visit
Admission: RE | Admit: 2022-07-24 | Discharge: 2022-07-24 | Disposition: A | Discharge status: Home / Self Care | Payer: 59 | Source: Ambulatory Visit | Attending: Nurse Practitioner | Admitting: Nurse Practitioner

## 2022-07-24 DIAGNOSIS — L299 Pruritus, unspecified: Secondary | ICD-10-CM

## 2022-07-24 DIAGNOSIS — R748 Abnormal levels of other serum enzymes: Secondary | ICD-10-CM

## 2022-07-24 DIAGNOSIS — K760 Fatty (change of) liver, not elsewhere classified: Secondary | ICD-10-CM

## 2022-07-28 ENCOUNTER — Encounter: Payer: Self-pay | Admitting: Nurse Practitioner

## 2022-09-21 ENCOUNTER — Emergency Department (HOSPITAL_COMMUNITY): Payer: 59

## 2022-09-21 ENCOUNTER — Encounter (HOSPITAL_COMMUNITY): Payer: Self-pay

## 2022-09-21 ENCOUNTER — Emergency Department (HOSPITAL_COMMUNITY)
Admission: EM | Admit: 2022-09-21 | Discharge: 2022-09-21 | Disposition: A | Discharge status: Home / Self Care | Payer: 59 | Attending: Emergency Medicine | Admitting: Emergency Medicine

## 2022-09-21 ENCOUNTER — Other Ambulatory Visit: Payer: Self-pay

## 2022-09-21 DIAGNOSIS — R0789 Other chest pain: Secondary | ICD-10-CM | POA: Diagnosis not present

## 2022-09-21 DIAGNOSIS — R0602 Shortness of breath: Secondary | ICD-10-CM | POA: Diagnosis not present

## 2022-09-21 DIAGNOSIS — R110 Nausea: Secondary | ICD-10-CM | POA: Insufficient documentation

## 2022-09-21 LAB — CBC
HCT: 42.5 % (ref 36.0–46.0)
Hemoglobin: 14 g/dL (ref 12.0–15.0)
MCH: 31.3 pg (ref 26.0–34.0)
MCHC: 32.9 g/dL (ref 30.0–36.0)
MCV: 95.1 fL (ref 80.0–100.0)
Platelets: 337 10*3/uL (ref 150–400)
RBC: 4.47 MIL/uL (ref 3.87–5.11)
RDW: 12.6 % (ref 11.5–15.5)
WBC: 7.3 10*3/uL (ref 4.0–10.5)
nRBC: 0 % (ref 0.0–0.2)

## 2022-09-21 LAB — HCG, SERUM, QUALITATIVE: Preg, Serum: NEGATIVE

## 2022-09-21 LAB — TROPONIN I (HIGH SENSITIVITY)
Troponin I (High Sensitivity): <2 ng/L (ref <18)
Troponin I (High Sensitivity): <2 ng/L (ref <18)

## 2022-09-21 LAB — BASIC METABOLIC PANEL
Anion gap: 8 (ref 5–15)
BUN: 13 mg/dL (ref 6–20)
CO2: 27 mmol/L (ref 22–32)
Calcium: 9.3 mg/dL (ref 8.9–10.3)
Chloride: 101 mmol/L (ref 98–111)
Creatinine, Ser: 0.74 mg/dL (ref 0.44–1.00)
GFR, Estimated: >60 mL/min (ref >60)
Glucose, Bld: 100 mg/dL — ABNORMAL HIGH (ref 70–99)
Potassium: 4.4 mmol/L (ref 3.5–5.1)
Sodium: 136 mmol/L (ref 135–145)

## 2022-09-21 MED ORDER — IOHEXOL 350 MG/ML SOLN
75.0000 mL | Freq: Once | INTRAVENOUS | Status: AC | PRN
  Administered 2022-09-21: 75 mL via INTRAVENOUS

## 2022-09-21 NOTE — Discharge Instructions (Signed)
Test results today are reassuring.  Follow-up with your outpatient providers.  Return to the emergency department for any new or worsening symptoms of concern.

## 2022-09-21 NOTE — ED Triage Notes (Addendum)
Pt coming in complaining of chest pain/pressure that has been going on for a couple of weeks. Pt referred to ER by cardiologist due to s/s. Pt also endorses nausea, and intermittent SOB. Hx of SVT.

## 2022-09-21 NOTE — ED Provider Notes (Signed)
[place] EMERGENCY DEPARTMENT AT [facility] Provider Note   CSN: [id number] Arrival date & time: 09/21/22  1639     History  Chief Complaint  Patient presents with   Chest Pain    Christine Schneider is a 32 y.o. female.   Chest Pain Associated symptoms: nausea and shortness of breath   Patient presents for chest pain.  Medical history includes anxiety, depression, SVT, atrial flutter, HLD, bipolar disorder, RLS, PCOS.  She sees cardiology through [facility].  She was last seen in the office in February.  Over the past 2 weeks, patient has had intermittent symptoms of chest pain, chest pressure, shortness of breath, nausea.  This seems to occur every day.  Typically onset will be in the afternoons and persist throughout the night.  She reached out to her cardiology office and they told her they cannot see her until she is evaluated in the ED.  Currently, she endorses some mild nausea.       Home Medications Prior to Admission medications   Medication Sig Start Date End Date Taking? Authorizing Provider  albuterol (VENTOLIN HFA) 108 (90 Base) MCG/ACT inhaler Inhale 2 puffs into the lungs every 6 (six) hours as needed for wheezing or shortness of breath. 04/15/19   Steffanie Dunn, DO  amphetamine-dextroamphetamine (ADDERALL XR) 20 MG 24 hr capsule Take 20 mg by mouth in the morning.    [provider]  celecoxib (CELEBREX) 200 MG capsule Take 1 capsule (200 mg total) by mouth 2 (two) times daily. 03/23/22   Cassandria Anger, PA-C  DULoxetine (CYMBALTA) 30 MG capsule Take 30 mg by mouth See admin instructions. Take with 60 mg for a total of 90 mg in the morning    [provider]  DULoxetine (CYMBALTA) 60 MG capsule Take 60 mg by mouth See admin instructions. Take with 30 mg for a total of 90 mg in the morning    [provider]  ESTARYLLA 0.25-35 MG-MCG tablet Take 1 tablet by mouth daily. 02/26/22   [provider]  gabapentin (NEURONTIN) 300  MG capsule Take 300 mg by mouth 2 (two) times daily.    [provider]  levocetirizine (XYZAL) 5 MG tablet Take 5 mg by mouth every evening.    [provider]  metoprolol succinate (TOPROL-XL) 50 MG 24 hr tablet Take 50 mg by mouth 2 (two) times daily.    [provider]  Multiple Vitamins-Minerals (MULTIVITAMIN WITH MINERALS) tablet Take 1 tablet by mouth daily.    [provider]  Omega-3 Fatty Acids (FISH OIL) 1000 MG CAPS Take 2,000 mg by mouth at bedtime.    [provider]  ondansetron (ZOFRAN) 4 MG tablet Take 1 tablet (4 mg total) by mouth every 6 (six) hours as needed for nausea. 03/23/22   Cassandria Anger, PA-C  oxyCODONE (OXY IR/ROXICODONE) 5 MG immediate release tablet Take 1 tablet (5 mg total) by mouth every 4 (four) hours as needed for severe pain. 03/23/22   Cassandria Anger, PA-C  RESVERATROL PO Take 2 tablets by mouth daily. Gelcap    [provider]  tiZANidine (ZANAFLEX) 2 MG tablet Take 1 tablet (2 mg total) by mouth every 8 (eight) hours as needed for muscle spasms. 03/23/22   Cassandria Anger, PA-C  traZODone (DESYREL) 50 MG tablet Take 50 mg by mouth at bedtime. 01/17/22   [provider]      Allergies    Oxycodone-acetaminophen and Hydromorphone  Review of Systems   Review of Systems  Respiratory:  Positive for chest tightness and shortness of breath.   Cardiovascular:  Positive for chest pain.  Gastrointestinal:  Positive for nausea.  All other systems reviewed and are negative.   Physical Exam Updated Vital Signs BP 134/78   Pulse 75   Temp 99 F (37.2 C) (Oral)   Resp 17   Ht 5' 4.25" (1.632 m)   Wt 93.4 kg   LMP 08/31/2022   SpO2 99%   BMI 35.09 kg/m  Physical Exam Vitals and nursing note reviewed.  Constitutional:      General: She is not in acute distress.    Appearance: She is well-developed. She is not ill-appearing, toxic-appearing or diaphoretic.  HENT:     Head:  Normocephalic and atraumatic.  Eyes:     Conjunctiva/sclera: Conjunctivae normal.  Cardiovascular:     Rate and Rhythm: Normal rate and regular rhythm.     Heart sounds: No murmur heard. Pulmonary:     Effort: Pulmonary effort is normal. No tachypnea or respiratory distress.     Breath sounds: Normal breath sounds. No decreased breath sounds, wheezing, rhonchi or rales.  Chest:     Chest wall: No tenderness.  Abdominal:     Palpations: Abdomen is soft.     Tenderness: There is no abdominal tenderness.  Musculoskeletal:        General: No swelling. Normal range of motion.     Cervical back: Neck supple.     Right lower leg: No edema.     Left lower leg: No edema.  Skin:    General: Skin is warm and dry.     Coloration: Skin is not cyanotic or pale.  Neurological:     General: No focal deficit present.     Mental Status: She is alert and oriented to person, place, and time.  Psychiatric:        Mood and Affect: Mood normal.        Behavior: Behavior normal.     ED Results / Procedures / Treatments   Labs (all labs ordered are listed, but only abnormal results are displayed) Labs Reviewed  BASIC METABOLIC PANEL - Abnormal; Notable for the following components:      Result Value   Glucose, Bld 100 (*)    All other components within normal limits  CBC  HCG, SERUM, QUALITATIVE  TROPONIN I (HIGH SENSITIVITY)  TROPONIN I (HIGH SENSITIVITY)    EKG EKG Interpretation Date/Time:  Wednesday September 21 2022 16:54:00 EDT Ventricular Rate:  84 PR Interval:  149 QRS Duration:  89 QT Interval:  350 QTC Calculation: 414 R Axis:   40  Text Interpretation: Sinus rhythm Low voltage, precordial leads RSR' in V1 or V2, probably normal variant Baseline wander in lead(s) II III aVF V3 Confirmed by Gloris Manchester ([id number]) on 09/21/2022 7:21:26 PM  Radiology CT Angio Chest PE W and/or Wo Contrast  Result Date: 09/21/2022 CLINICAL DATA:  Chest pain. EXAM: CT ANGIOGRAPHY CHEST WITH CONTRAST  TECHNIQUE: Multidetector CT imaging of the chest was performed using the standard protocol during bolus administration of intravenous contrast. Multiplanar CT image reconstructions and MIPs were obtained to evaluate the vascular anatomy. RADIATION DOSE REDUCTION: This exam was performed according to the departmental dose-optimization program which includes automated exposure control, adjustment of the mA and/or kV according to patient size and/or use of iterative reconstruction technique. CONTRAST:  75mL OMNIPAQUE IOHEXOL 350 MG/ML SOLN COMPARISON:  April 17, 2019 FINDINGS:  Cardiovascular: The thoracic aorta is normal in appearance. Satisfactory opacification of the pulmonary arteries to the segmental level. No evidence of pulmonary embolism. Normal heart size. No pericardial effusion. Mediastinum/Nodes: No enlarged mediastinal, hilar, or axillary lymph nodes. Thyroid gland, trachea, and esophagus demonstrate no significant findings. Lungs/Pleura: Lungs are clear. No pleural effusion or pneumothorax. Upper Abdomen: There is a small hiatal hernia. Multiple surgical clips are seen within the gallbladder fossa. Musculoskeletal: No chest wall abnormality. No acute or significant osseous findings. Review of the MIP images confirms the above findings. IMPRESSION: 1. No evidence of pulmonary embolism or acute cardiopulmonary disease. 2. Small hiatal hernia. 3. Evidence of prior cholecystectomy. Electronically Signed   By: Aram Candela M.D.   On: 09/21/2022 20:43   DG Chest 2 View  Result Date: 09/21/2022 CLINICAL DATA:  Chest pain EXAM: CHEST - 2 VIEW COMPARISON:  X-ray 10/18/2019 FINDINGS: The heart size and mediastinal contours are within normal limits. No consolidation, pneumothorax or effusion. No edema. The visualized skeletal structures are unremarkable. Overlapping cardiac leads. Surgical clips in the right upper quadrant IMPRESSION: No acute cardiopulmonary disease. Electronically Signed   By: Karen Kays  M.D.   On: 09/21/2022 18:02    Procedures Procedures    Medications Ordered in ED Medications  iohexol (OMNIPAQUE) 350 MG/ML injection 75 mL (75 mLs Intravenous Contrast Given 09/21/22 2003)    ED Course/ Medical Decision Making/ A&P                                 Medical Decision Making Amount and/or Complexity of Data Reviewed Labs: ordered. Radiology: ordered.  Risk Prescription drug management.   This patient presents to the ED for concern of chest discomfort, this involves an extensive number of treatment options, and is a complaint that carries with it a high risk of complications and morbidity.  The differential diagnosis includes reactive airway disease, PE, reflux, musculoskeletal etiology, anxiety   Co morbidities that complicate the patient evaluation  anxiety, depression, SVT, atrial flutter, HLD, bipolar disorder, RLS, PCOS   Additional history obtained:  Additional history obtained from N/A External records from outside source obtained and reviewed including EMR   Lab Tests:  I Ordered, and personally interpreted labs.  The pertinent results include: Normal hemoglobin, no leukocytosis, normal kidney function, normal electrolytes, normal troponins x 2   Imaging Studies ordered:  I ordered imaging studies including chest x-ray, CTA chest I independently visualized and interpreted imaging which showed no acute findings, small hiatal hernia is present. I agree with the radiologist interpretation   Cardiac Monitoring: / EKG:  The patient was maintained on a cardiac monitor.  I personally viewed and interpreted the cardiac monitored which showed an underlying rhythm of: Sinus rhythm   Problem List / ED Course / Critical interventions / Medication management  Patient presenting for intermittent symptoms over the past 2 weeks of chest pressure, shortness of breath, nausea.  On arrival in the ED, vital signs are normal.  Patient is well-appearing on exam.   Her breathing is unlabored.  Lungs are clear to auscultation.  I do not appreciate any cardiac rubs or murmurs.  EKG shows normal sinus rhythm.  Initial lab work shows normal hemoglobin, no leukocytosis, normal kidney function, normal electrolytes, and normal initial troponin.  X-ray shows no acute findings.  CTA did not show acute findings.  He did show small hiatal hernia which will subject patient to reflux and  may explain her recent symptoms.  Patient states that she does know about her hiatal hernia.  She was offered GI cocktail in the ED.  She declined.  She feels comfortable with discharge home.   Social Determinants of Health:  Has access to outpatient care        Final Clinical Impression(s) / ED Diagnoses Final diagnoses:  Chest discomfort    Rx / DC Orders ED Discharge Orders     None         Gloris Manchester, MD 09/21/22 2132

## 2022-10-18 ENCOUNTER — Ambulatory Visit (INDEPENDENT_AMBULATORY_CARE_PROVIDER_SITE_OTHER): Payer: 59 | Admitting: Nurse Practitioner

## 2022-10-18 ENCOUNTER — Encounter: Payer: Self-pay | Admitting: Nurse Practitioner

## 2022-10-18 VITALS — BP 120/76 | HR 68 | Wt 208.6 lb

## 2022-10-18 DIAGNOSIS — E611 Iron deficiency: Secondary | ICD-10-CM

## 2022-10-18 DIAGNOSIS — R7401 Elevation of levels of liver transaminase levels: Secondary | ICD-10-CM

## 2022-10-18 DIAGNOSIS — I471 Supraventricular tachycardia, unspecified: Secondary | ICD-10-CM | POA: Diagnosis not present

## 2022-10-18 DIAGNOSIS — Z6836 Body mass index (BMI) 36.0-36.9, adult: Secondary | ICD-10-CM

## 2022-10-18 DIAGNOSIS — E782 Mixed hyperlipidemia: Secondary | ICD-10-CM

## 2022-10-18 DIAGNOSIS — K219 Gastro-esophageal reflux disease without esophagitis: Secondary | ICD-10-CM

## 2022-10-18 DIAGNOSIS — Z8719 Personal history of other diseases of the digestive system: Secondary | ICD-10-CM | POA: Diagnosis not present

## 2022-10-18 DIAGNOSIS — E559 Vitamin D deficiency, unspecified: Secondary | ICD-10-CM

## 2022-10-18 DIAGNOSIS — R7303 Prediabetes: Secondary | ICD-10-CM

## 2022-10-18 MED ORDER — PANTOPRAZOLE SODIUM 40 MG PO TBEC
40.0000 mg | DELAYED_RELEASE_TABLET | Freq: Every day | ORAL | 3 refills | Status: AC
Start: 2022-10-18 — End: ?

## 2022-10-18 MED ORDER — WEGOVY 0.25 MG/0.5ML ~~LOC~~ SOAJ: 0.2500 mg | SUBCUTANEOUS | 0 refills | Status: DC

## 2022-10-18 NOTE — Progress Notes (Signed)
Christine Eth, DNP, AGNP-c [facility] [address] [place], [place] [address] [phone]   ACUTE VISIT- ESTABLISHED PATIENT  Blood pressure 120/76, pulse 68, weight 208 lb 9.6 oz (94.6 kg), last menstrual period 08/31/2022.  Subjective:  HPI Christine Schneider is a 32 y.o. female presents to day for evaluation of acute concern(s).   Chest Pressure: She was seen in the ED on 08/21 with extensive work-up with no concerning findings. Cardiopulmonary work-up was negative. She does have a hx of hiatal hernia, unclear if this was contributing with GERD. She tells me she takes mediation for reflux as needed and she did not feel this was the cause. She tells me the symptoms are happening every day in the afternoon around the same time (3-4pm). She tells me she will have nausea and chest pressure, but all of her vitals are normal during the episodes.  Maalox, Gaviscon, or Pepcid AC for immediate use.  She will take omeprazole if the symptoms go on for several days.  She has a hx of  SVT, but reports this is not occurring during these periods.   Weight: She has only tried diet and exercise in the past for weight management. She tells me she eats well and does not overeat. She tells me she is very active. She did have a hip replacement in February, which may have slowed some of her activity. She tells me she did get up to 217/218 lbs and she has lost down to 204/208lbs but cannot get lower than that. She tells me her biggest concern with her weight is her joints and arthritis. She also has a strong family history of cardiovascular disease. She does not drink soda, she drinks water.  She has a history of gestational diabetes with insulin dependent diabetes during both pregnancy's.  She has been prediabetic outside of pregnancy. She also has a history of HLD.  ROS negative except for what is listed in HPI. History, Medications, Surgery, SDOH, and Family History reviewed and updated  as appropriate.  Objective:  Physical Exam Vitals and nursing note reviewed.  Constitutional:      Appearance: Normal appearance. She is obese.  HENT:     Head: Normocephalic.  Eyes:     Conjunctiva/sclera: Conjunctivae normal.  Cardiovascular:     Rate and Rhythm: Normal rate and regular rhythm.     Pulses: Normal pulses.     Heart sounds: Normal heart sounds.  Pulmonary:     Effort: Pulmonary effort is normal.     Breath sounds: Normal breath sounds.  Abdominal:     General: Bowel sounds are normal. There is no distension.     Palpations: Abdomen is soft.  Musculoskeletal:        General: Normal range of motion.     Right lower leg: No edema.     Left lower leg: No edema.  Skin:    General: Skin is warm and dry.     Capillary Refill: Capillary refill takes less than 2 seconds.  Neurological:     Mental Status: She is alert and oriented to person, place, and time.     Motor: No weakness.  Psychiatric:        Behavior: Behavior normal.         Assessment & Plan:   Problem List Items Addressed This Visit     SVT (supraventricular tachycardia)    Currently managed with metoprolol. She does not feel the SVT is contributing to her chest pressure symptoms.  HRRR today. She is following up with cardiology later this week. We will continue current medication and monitor.       Relevant Orders   Hemoglobin A1c (Completed)   CBC with Differential/Platelet (Completed)   Comprehensive metabolic panel (Completed)   Iron, TIBC and Ferritin Panel (Completed)   LP+LDL Direct (Completed)   Elevated ALT measurement - Primary    Recommend continued weight management. Labs pending.       Relevant Medications   Semaglutide-Weight Management (WEGOVY) 0.25 MG/0.5ML SOAJ   Other Relevant Orders   UA/M w/rflx Culture, Routine (Completed)   Hemoglobin A1c (Completed)   CBC with Differential/Platelet (Completed)   Comprehensive metabolic panel (Completed)   Iron, TIBC and Ferritin  Panel (Completed)   LP+LDL Direct (Completed)   Mixed hyperlipidemia    Chronic. Not currently managed with medication. Recommend very strict low saturated fat, high fiber diet in the setting of family hx of CVD, BMI >35, and hx of pre-DM/gestational DM. Consider statin therapy for optimal control and management.       Relevant Medications   Semaglutide-Weight Management (WEGOVY) 0.25 MG/0.5ML SOAJ   Other Relevant Orders   Hemoglobin A1c (Completed)   CBC with Differential/Platelet (Completed)   Comprehensive metabolic panel (Completed)   Iron, TIBC and Ferritin Panel (Completed)   LP+LDL Direct (Completed)   BMI 36.0-36.9,adult    BMI elevated with difficulty managing weight despite diet and active lifestyle. I suspect insulin resistance is contributing. Given her hx of gestational diabetes and current Pre-DM she is at high risk of development of diabetes in the future. She also has co-morbidities of HLD, fatty liver, and irregular heart rhythms which would all benefit from weight loss. We discussed options today and she is interested in GLP-1. Will send script for wegovy to be used in addition to strict diet and exercise. Information/recommendations provided on AVS. F/U in 3-6 months      Relevant Medications   Semaglutide-Weight Management (WEGOVY) 0.25 MG/0.5ML SOAJ   Other Relevant Orders   Hemoglobin A1c (Completed)   CBC with Differential/Platelet (Completed)   Comprehensive metabolic panel (Completed)   Iron, TIBC and Ferritin Panel (Completed)   LP+LDL Direct (Completed)   History of fatty infiltration of liver    Continue with weight management through strict diet and exercise. Avoid skipping meals. Keep saturated fat intake low. Labs pending today.       Relevant Medications   Semaglutide-Weight Management (WEGOVY) 0.25 MG/0.5ML SOAJ   Other Relevant Orders   Hemoglobin A1c (Completed)   CBC with Differential/Platelet (Completed)   Comprehensive metabolic panel  (Completed)   Iron, TIBC and Ferritin Panel (Completed)   LP+LDL Direct (Completed)   Pre-diabetes    Chronic with hx of insulin dependent gestational DM. She is at high risk of DM development. We discussed options for risk reduction including diet and weight management. We will trial GLP-1 with Wegovy to see if we can get improved weight management to reduce risks.       Relevant Medications   Semaglutide-Weight Management (WEGOVY) 0.25 MG/0.5ML SOAJ   Other Relevant Orders   Hemoglobin A1c (Completed)   CBC with Differential/Platelet (Completed)   Comprehensive metabolic panel (Completed)   Iron, TIBC and Ferritin Panel (Completed)   LP+LDL Direct (Completed)   Iron deficiency    Labs pending.       Relevant Orders   CBC with Differential/Platelet (Completed)   Iron, TIBC and Ferritin Panel (Completed)   Gastroesophageal reflux disease    Chronic GERD  not currently on daily therapy. In the setting of chest pressure and known hiatal hernia, I suspect that her symptoms are likely related to uncontrolled GERD. I recommend consideration of daily PPI to see if this is helpful for management. Cardiac work-up negative in ED. We will plan to begin daily pantoprazole for 30 days and monitor to see if symptoms resolve/improve. Continue with cardiology evaluation later this week.       Relevant Medications   pantoprazole (PROTONIX) 40 MG tablet   Other Visit Diagnoses     Vitamin D deficiency       Relevant Orders   VITAMIN D 25 Hydroxy (Vit-D Deficiency, Fractures) (Completed)       Time: 48 minutes, >50% spent counseling, care coordination, chart review, and documentation.   Christine Eth, DNP, AGNP-c

## 2022-10-18 NOTE — Patient Instructions (Signed)
I have sent in pantoprazole to see if this helps with the chest pressure. It sounds a lot like silent reflux.   I have sent in the [facility] to see if we can get coverage for this. It will take a couple of days to get prior authorization on the medication.   WEIGHT LOSS PLANNING Your progress today shows:     10/18/2022    1:54 PM 09/21/2022    9:30 PM 09/21/2022    8:30 PM  Vitals with BMI  Weight 208 lbs 10 oz    Systolic 120 116 295  Diastolic 76 79 78  Pulse 68 66 75    For best management of weight, it is vital to balance intake versus output. This means the number of calories burned per day must be less than the calories you take in with food and drink.   I recommend trying to follow a diet with the following: Calories: 1200-1500 calories per day Carbohydrates: 150-180 grams of carbohydrates per day  Why: Gives your body enough "quick fuel" for cells to maintain normal function without sending them into starvation mode.  Protein: At least 90 grams of protein per day- 30 grams with each meal Why: Protein takes longer and uses more energy than carbohydrates to break down for fuel. The carbohydrates in your meals serves as quick energy sources and proteins help use some of that extra quick energy to break down to produce long term energy. This helps you not feel hungry as quickly and protein breakdown burns calories.  Water: Drink AT LEAST 64 ounces of water per day  Why: Water is essential to healthy metabolism. Water helps to fill the stomach and keep you fuller longer. Water is required for healthy digestion and filtering of waste in the body.  Fat: Limit fats in your diet- when choosing fats, choose foods with lower fats content such as lean meats (chicken, fish, [place]).  Why: Increased fat intake leads to storage "for later". Once you burn your carbohydrate energy, your body goes into fat and protein breakdown mode to help you loose weight.  Cholesterol: Fats and oils that are LIQUID  at room temperature are best. Choose vegetable oils (olive oil, avocado oil, nuts). Avoid fats that are SOLID at room temperature (animal fats, processed meats). Healthy fats are often found in whole grains, beans, nuts, seeds, and berries.  Why: Elevated cholesterol levels lead to build up of cholesterol on the inside of your blood vessels. This will eventually cause the blood vessels to become hard and can lead to high blood pressure and damage to your organs. When the blood flow is reduced, but the pressure is high from cholesterol buildup, parts of the cholesterol can break off and form clots that can go to the brain or heart leading to a stroke or heart attack.  Fiber: Increase amount of SOLUBLE the fiber in your diet. This helps to fill you up, lowers cholesterol, and helps with digestion. Some foods high in soluble fiber are oats, peas, beans, apples, carrots, barley, and citrus fruits.   Why: Fiber fills you up, helps remove excess cholesterol, and aids in healthy digestion which are all very important in weight management.   I recommend the following as a minimum activity routine: Purposeful walk or other physical activity at least 20 minutes every single day. This means purposefully taking a walk, jog, bike, swim, treadmill, elliptical, dance, etc.  This activity should be ABOVE your normal daily activities, such as walking at work.  Goal exercise should be at least 150 minutes a week- work your way up to this.   Heart Rate: Your maximum exercise heart rate should be 220 - Your Age in Years. When exercising, get your heart rate up, but avoid going over the maximum targeted heart rate.  60-70% of your maximum heart rate is where you tend to burn the most fat. To find this number:  220 - Age In Years= Max HR  Max HR x 0.6 (or 0.7) = Fat Burning HR The Fat Burning HR is your goal heart rate while working out to burn the most fat.  NEVER exercise to the point your feel lightheaded, weak,  nauseated, dizzy. If you experience ANY of these symptoms- STOP exercise! Allow yourself to cool down and your heart rate to come down. Then restart slower next time.  If at ANY TIME you feel chest pain or chest pressure during exercise, STOP IMMEDIATELY and seek medical attention.

## 2022-10-19 LAB — UA/M W/RFLX CULTURE, ROUTINE
Bilirubin, UA: NEGATIVE
Glucose, UA: NEGATIVE
Ketones, UA: NEGATIVE
Leukocytes,UA: NEGATIVE
Nitrite, UA: NEGATIVE
Protein,UA: NEGATIVE
RBC, UA: NEGATIVE
Specific Gravity, UA: 1.021 (ref 1.005–1.030)
Urobilinogen, Ur: 0.2 mg/dL (ref 0.2–1.0)
pH, UA: 6.5 (ref 5.0–7.5)

## 2022-10-19 LAB — COMPREHENSIVE METABOLIC PANEL
ALT: 33 IU/L — ABNORMAL HIGH (ref 0–32)
AST: 23 IU/L (ref 0–40)
Albumin: 4.3 g/dL (ref 3.9–4.9)
Alkaline Phosphatase: 127 IU/L — ABNORMAL HIGH (ref 44–121)
BUN/Creatinine Ratio: 18 (ref 9–23)
BUN: 13 mg/dL (ref 6–20)
Bilirubin Total: <0.2 mg/dL (ref 0.0–1.2)
CO2: 24 mmol/L (ref 20–29)
Calcium: 9.5 mg/dL (ref 8.7–10.2)
Chloride: 98 mmol/L (ref 96–106)
Creatinine, Ser: 0.74 mg/dL (ref 0.57–1.00)
Globulin, Total: 2.6 g/dL (ref 1.5–4.5)
Glucose: 85 mg/dL (ref 70–99)
Potassium: 4.7 mmol/L (ref 3.5–5.2)
Sodium: 137 mmol/L (ref 134–144)
Total Protein: 6.9 g/dL (ref 6.0–8.5)
eGFR: 110 mL/min/{1.73_m2} (ref >59)

## 2022-10-19 LAB — LP+LDL DIRECT
Cholesterol, Total: 259 mg/dL — ABNORMAL HIGH (ref 100–199)
HDL: 32 mg/dL — ABNORMAL LOW (ref >39)
LDL Chol Calc (NIH): 155 mg/dL — ABNORMAL HIGH (ref 0–99)
LDL Direct: 169 mg/dL — ABNORMAL HIGH (ref 0–99)
Triglycerides: 378 mg/dL — ABNORMAL HIGH (ref 0–149)
VLDL Cholesterol Cal: 72 mg/dL — ABNORMAL HIGH (ref 5–40)

## 2022-10-19 LAB — IRON,TIBC AND FERRITIN PANEL
Ferritin: 21 ng/mL (ref 15–150)
Iron Saturation: 30 % (ref 15–55)
Iron: 137 ug/dL (ref 27–159)
Total Iron Binding Capacity: 461 ug/dL — ABNORMAL HIGH (ref 250–450)
UIBC: 324 ug/dL (ref 131–425)

## 2022-10-19 LAB — CBC WITH DIFFERENTIAL/PLATELET
Basophils Absolute: 0 10*3/uL (ref 0.0–0.2)
Basos: 1 %
EOS (ABSOLUTE): 0.1 10*3/uL (ref 0.0–0.4)
Eos: 2 %
Hematocrit: 41.8 % (ref 34.0–46.6)
Hemoglobin: 14.1 g/dL (ref 11.1–15.9)
Immature Grans (Abs): 0 10*3/uL (ref 0.0–0.1)
Immature Granulocytes: 0 %
Lymphocytes Absolute: 1.9 10*3/uL (ref 0.7–3.1)
Lymphs: 32 %
MCH: 31.3 pg (ref 26.6–33.0)
MCHC: 33.7 g/dL (ref 31.5–35.7)
MCV: 93 fL (ref 79–97)
Monocytes Absolute: 0.4 10*3/uL (ref 0.1–0.9)
Monocytes: 7 %
Neutrophils Absolute: 3.5 10*3/uL (ref 1.4–7.0)
Neutrophils: 58 %
Platelets: 339 10*3/uL (ref 150–450)
RBC: 4.5 x10E6/uL (ref 3.77–5.28)
RDW: 12.4 % (ref 11.7–15.4)
WBC: 6 10*3/uL (ref 3.4–10.8)

## 2022-10-19 LAB — VITAMIN D 25 HYDROXY (VIT D DEFICIENCY, FRACTURES): Vit D, 25-Hydroxy: 55.8 ng/mL (ref 30.0–100.0)

## 2022-10-19 LAB — HEMOGLOBIN A1C
Est. average glucose Bld gHb Est-mCnc: 120 mg/dL
Hgb A1c MFr Bld: 5.8 % — ABNORMAL HIGH (ref 4.8–5.6)

## 2022-10-24 ENCOUNTER — Encounter: Payer: Self-pay | Admitting: Nurse Practitioner

## 2022-10-24 DIAGNOSIS — K219 Gastro-esophageal reflux disease without esophagitis: Secondary | ICD-10-CM | POA: Insufficient documentation

## 2022-10-24 DIAGNOSIS — R7401 Elevation of levels of liver transaminase levels: Secondary | ICD-10-CM

## 2022-10-24 DIAGNOSIS — E782 Mixed hyperlipidemia: Secondary | ICD-10-CM

## 2022-10-24 DIAGNOSIS — Z6836 Body mass index (BMI) 36.0-36.9, adult: Secondary | ICD-10-CM

## 2022-10-24 DIAGNOSIS — R7303 Prediabetes: Secondary | ICD-10-CM

## 2022-10-24 DIAGNOSIS — E611 Iron deficiency: Secondary | ICD-10-CM | POA: Insufficient documentation

## 2022-10-24 DIAGNOSIS — Z8719 Personal history of other diseases of the digestive system: Secondary | ICD-10-CM

## 2022-10-24 NOTE — Assessment & Plan Note (Signed)
Currently managed with metoprolol. She does not feel the SVT is contributing to her chest pressure symptoms. HRRR today. She is following up with cardiology later this week. We will continue current medication and monitor.

## 2022-10-24 NOTE — Assessment & Plan Note (Signed)
Continue with weight management through strict diet and exercise. Avoid skipping meals. Keep saturated fat intake low. Labs pending today.

## 2022-10-24 NOTE — Assessment & Plan Note (Signed)
Labs pending.  

## 2022-10-24 NOTE — Assessment & Plan Note (Signed)
BMI elevated with difficulty managing weight despite diet and active lifestyle. I suspect insulin resistance is contributing. Given her hx of gestational diabetes and current Pre-DM she is at high risk of development of diabetes in the future. She also has co-morbidities of HLD, fatty liver, and irregular heart rhythms which would all benefit from weight loss. We discussed options today and she is interested in GLP-1. Will send script for wegovy to be used in addition to strict diet and exercise. Information/recommendations provided on AVS. F/U in 3-6 months

## 2022-10-24 NOTE — Assessment & Plan Note (Signed)
Chronic GERD not currently on daily therapy. In the setting of chest pressure and known hiatal hernia, I suspect that her symptoms are likely related to uncontrolled GERD. I recommend consideration of daily PPI to see if this is helpful for management. Cardiac work-up negative in ED. We will plan to begin daily pantoprazole for 30 days and monitor to see if symptoms resolve/improve. Continue with cardiology evaluation later this week.

## 2022-10-24 NOTE — Assessment & Plan Note (Signed)
Chronic with hx of insulin dependent gestational DM. She is at high risk of DM development. We discussed options for risk reduction including diet and weight management. We will trial GLP-1 with Wegovy to see if we can get improved weight management to reduce risks.

## 2022-10-24 NOTE — Assessment & Plan Note (Signed)
Chronic. Not currently managed with medication. Recommend very strict low saturated fat, high fiber diet in the setting of family hx of CVD, BMI >35, and hx of pre-DM/gestational DM. Consider statin therapy for optimal control and management.

## 2022-10-24 NOTE — Assessment & Plan Note (Signed)
Recommend continued weight management. Labs pending.

## 2022-10-28 ENCOUNTER — Telehealth: Payer: Self-pay

## 2022-10-28 ENCOUNTER — Other Ambulatory Visit: Payer: Self-pay

## 2022-10-28 ENCOUNTER — Other Ambulatory Visit (HOSPITAL_COMMUNITY): Payer: Self-pay

## 2022-10-28 MED ORDER — WEGOVY 0.25 MG/0.5ML ~~LOC~~ SOAJ
0.2500 mg | SUBCUTANEOUS | 0 refills | Status: DC
Start: 2022-10-28 — End: 2023-12-01
  Filled 2022-10-28: qty 2, 28d supply, fill #0

## 2022-10-28 MED ORDER — ROSUVASTATIN CALCIUM 10 MG PO TABS
10.0000 mg | ORAL_TABLET | ORAL | 3 refills | Status: DC
Start: 2022-10-28 — End: 2023-12-07
  Filled 2022-10-28: qty 36, 84d supply, fill #0
  Filled 2023-02-10: qty 36, 84d supply, fill #1
  Filled 2023-07-31 – 2023-09-25 (×2): qty 36, 84d supply, fill #2

## 2022-10-28 NOTE — Telephone Encounter (Signed)
Key: [id number] Rx #: [id number] Drug: HQIONG 0.25MG /0.5ML auto-injectors Form: [facility] Electronic Prior Authorization Form (2017 NCPDP) Status: Wait for Determination

## 2022-10-28 NOTE — Telephone Encounter (Signed)
Please let her know that I have sent in the rosuvastatin for three days a week. I have also sent the [facility] to the [facility] pharmacy on [address] to see if we can get it there.   I sent both prescriptions to the [facility]. If they are not able to fill the wegovy, I can move the rosuvastatin to [facility] for her.

## 2022-10-31 ENCOUNTER — Encounter: Payer: Self-pay | Admitting: Nurse Practitioner

## 2022-10-31 ENCOUNTER — Other Ambulatory Visit (HOSPITAL_COMMUNITY): Payer: Self-pay

## 2022-10-31 ENCOUNTER — Encounter (HOSPITAL_COMMUNITY): Payer: Self-pay

## 2022-10-31 NOTE — Telephone Encounter (Signed)
[facility] denied for plan exclusion. Please advise.

## 2022-10-31 NOTE — Telephone Encounter (Signed)
Plan exclusion means no weight loss meds will be covered, only option will be cash pay and cheapest options are phentermine or Qsymia has a $98 a month cash pay program.  Are either of these an option?

## 2022-10-31 NOTE — Telephone Encounter (Signed)
If plan does not cover, then we have no recourse. We can try zepbound and see if they will cover that, but my assumption is this would not be covered either. Did the denial mention what they do cover?

## 2023-02-20 ENCOUNTER — Encounter: Payer: 59 | Admitting: Nurse Practitioner

## 2023-07-25 ENCOUNTER — Ambulatory Visit (HOSPITAL_COMMUNITY)
Admission: EM | Admit: 2023-07-25 | Discharge: 2023-07-26 | Disposition: A | Discharge status: Home / Self Care | Attending: Nurse Practitioner | Admitting: Nurse Practitioner

## 2023-07-25 DIAGNOSIS — F430 Acute stress reaction: Secondary | ICD-10-CM | POA: Insufficient documentation

## 2023-07-25 DIAGNOSIS — R451 Restlessness and agitation: Secondary | ICD-10-CM | POA: Diagnosis not present

## 2023-07-25 DIAGNOSIS — F4329 Adjustment disorder with other symptoms: Secondary | ICD-10-CM | POA: Diagnosis not present

## 2023-07-25 DIAGNOSIS — F419 Anxiety disorder, unspecified: Secondary | ICD-10-CM | POA: Diagnosis not present

## 2023-07-25 DIAGNOSIS — Z630 Problems in relationship with spouse or partner: Secondary | ICD-10-CM | POA: Diagnosis not present

## 2023-07-25 LAB — POCT URINE DRUG SCREEN - MANUAL ENTRY (I-SCREEN)
POC Amphetamine UR: NOT DETECTED
POC Buprenorphine (BUP): NOT DETECTED
POC Cocaine UR: NOT DETECTED
POC Marijuana UR: NOT DETECTED
POC Methadone UR: NOT DETECTED
POC Methamphetamine UR: NOT DETECTED
POC Morphine: NOT DETECTED
POC Oxazepam (BZO): NOT DETECTED
POC Oxycodone UR: NOT DETECTED
POC Secobarbital (BAR): NOT DETECTED

## 2023-07-25 LAB — POC URINE PREG, ED: Preg Test, Ur: NEGATIVE

## 2023-07-25 MED ORDER — LORAZEPAM 2 MG/ML IJ SOLN: 2.0000 mg | Freq: Three times a day (TID) | INTRAMUSCULAR | Status: DC | PRN

## 2023-07-25 MED ORDER — HALOPERIDOL 5 MG PO TABS: 5.0000 mg | ORAL_TABLET | Freq: Three times a day (TID) | ORAL | Status: DC | PRN

## 2023-07-25 MED ORDER — HALOPERIDOL LACTATE 5 MG/ML IJ SOLN: 5.0000 mg | Freq: Three times a day (TID) | INTRAMUSCULAR | Status: DC | PRN

## 2023-07-25 MED ORDER — ALUM & MAG HYDROXIDE-SIMETH 200-200-20 MG/5ML PO SUSP: 30.0000 mL | ORAL | Status: DC | PRN

## 2023-07-25 MED ORDER — HALOPERIDOL LACTATE 5 MG/ML IJ SOLN: 10.0000 mg | Freq: Three times a day (TID) | INTRAMUSCULAR | Status: DC | PRN

## 2023-07-25 MED ORDER — DIPHENHYDRAMINE HCL 50 MG/ML IJ SOLN: 50.0000 mg | Freq: Three times a day (TID) | INTRAMUSCULAR | Status: DC | PRN

## 2023-07-25 MED ORDER — PROPRANOLOL HCL 10 MG PO TABS
10.0000 mg | ORAL_TABLET | Freq: Two times a day (BID) | ORAL | Status: DC
  Administered 2023-07-26: 10 mg via ORAL
  Filled 2023-07-25: qty 1

## 2023-07-25 MED ORDER — DIPHENHYDRAMINE HCL 50 MG PO CAPS: 50.0000 mg | ORAL_CAPSULE | Freq: Three times a day (TID) | ORAL | Status: DC | PRN

## 2023-07-25 MED ORDER — LORAZEPAM 1 MG PO TABS
1.0000 mg | ORAL_TABLET | Freq: Once | ORAL | Status: AC
  Administered 2023-07-26: 1 mg via ORAL
  Filled 2023-07-25: qty 1

## 2023-07-25 MED ORDER — MAGNESIUM HYDROXIDE 400 MG/5ML PO SUSP: 30.0000 mL | Freq: Every day | ORAL | Status: DC | PRN
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
## 2023-07-26 DIAGNOSIS — F430 Acute stress reaction: Secondary | ICD-10-CM | POA: Diagnosis not present

## 2023-07-26 LAB — LIPID PANEL
Cholesterol: 282 mg/dL — ABNORMAL HIGH (ref 0–200)
HDL: 37 mg/dL — ABNORMAL LOW (ref >40)
LDL Cholesterol: 185 mg/dL — ABNORMAL HIGH (ref 0–99)
Total CHOL/HDL Ratio: 7.6 ratio
Triglycerides: 298 mg/dL — ABNORMAL HIGH (ref <150)
VLDL: 60 mg/dL — ABNORMAL HIGH (ref 0–40)

## 2023-07-26 LAB — COMPREHENSIVE METABOLIC PANEL WITH GFR
ALT: 59 U/L — ABNORMAL HIGH (ref 0–44)
AST: 29 U/L (ref 15–41)
Albumin: 4.1 g/dL (ref 3.5–5.0)
Alkaline Phosphatase: 126 U/L (ref 38–126)
Anion gap: 17 — ABNORMAL HIGH (ref 5–15)
BUN: 8 mg/dL (ref 6–20)
CO2: 18 mmol/L — ABNORMAL LOW (ref 22–32)
Calcium: 9.5 mg/dL (ref 8.9–10.3)
Chloride: 102 mmol/L (ref 98–111)
Creatinine, Ser: 0.72 mg/dL (ref 0.44–1.00)
GFR, Estimated: >60 mL/min (ref >60)
Glucose, Bld: 80 mg/dL (ref 70–99)
Potassium: 4.4 mmol/L (ref 3.5–5.1)
Sodium: 137 mmol/L (ref 135–145)
Total Bilirubin: 0.4 mg/dL (ref 0.0–1.2)
Total Protein: 7.6 g/dL (ref 6.5–8.1)

## 2023-07-26 LAB — CBC WITH DIFFERENTIAL/PLATELET
Abs Immature Granulocytes: 0.01 10*3/uL (ref 0.00–0.07)
Basophils Absolute: 0 10*3/uL (ref 0.0–0.1)
Basophils Relative: 1 %
Eosinophils Absolute: 0.1 10*3/uL (ref 0.0–0.5)
Eosinophils Relative: 2 %
HCT: 44.1 % (ref 36.0–46.0)
Hemoglobin: 15 g/dL (ref 12.0–15.0)
Immature Granulocytes: 0 %
Lymphocytes Relative: 20 %
Lymphs Abs: 1.5 10*3/uL (ref 0.7–4.0)
MCH: 31.3 pg (ref 26.0–34.0)
MCHC: 34 g/dL (ref 30.0–36.0)
MCV: 92.1 fL (ref 80.0–100.0)
Monocytes Absolute: 0.5 10*3/uL (ref 0.1–1.0)
Monocytes Relative: 6 %
Neutro Abs: 5.5 10*3/uL (ref 1.7–7.7)
Neutrophils Relative %: 71 %
Platelets: 290 10*3/uL (ref 150–400)
RBC: 4.79 MIL/uL (ref 3.87–5.11)
RDW: 12.5 % (ref 11.5–15.5)
WBC: 7.6 10*3/uL (ref 4.0–10.5)
nRBC: 0 % (ref 0.0–0.2)

## 2023-07-26 LAB — HEMOGLOBIN A1C
Hgb A1c MFr Bld: 5.6 % (ref 4.8–5.6)
Mean Plasma Glucose: 114.02 mg/dL

## 2023-07-26 LAB — TSH: TSH: 2.013 u[IU]/mL (ref 0.350–4.500)

## 2023-07-26 MED ORDER — HYDROXYZINE HCL 25 MG PO TABS
25.0000 mg | ORAL_TABLET | Freq: Three times a day (TID) | ORAL | Status: DC | PRN
  Administered 2023-07-26: 25 mg via ORAL
  Filled 2023-07-26: qty 1

## 2023-07-26 MED ORDER — IBUPROFEN 200 MG PO TABS
200.0000 mg | ORAL_TABLET | Freq: Four times a day (QID) | ORAL | Status: DC | PRN
  Administered 2023-07-26: 200 mg via ORAL
  Filled 2023-07-26: qty 1

## 2023-07-26 NOTE — ED Notes (Signed)
Patient observed resting quietly, eyes closed. Respirations equal and unlabored. Will continue to monitor for safety.  

## 2023-07-26 NOTE — Discharge Instructions (Signed)
 Follow-up recommendations:  Activity:  Normal, as tolerated Diet:  Per PCP recommendation  Patient is instructed prior to discharge to:  Follow up with your psychologist today Take all medications as prescribed by mental healthcare provider. Report any adverse effects and/or reactions from the medicines to outpatient provider promptly. To not engage in substance use while on psychiatric medicines.  In the event of worsening symptoms, patient is instructed to call the crisis hotline at 988, 911, or go to the nearest ED for appropriate evaluation and treatment of symptoms. To follow-up with primary care provider for your other medical issues, concerns and, or healthcare needs.
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
## 2023-07-26 NOTE — ED Notes (Signed)
 Patient has awakened from sleep and is currently demanding a pain med for a headache and her heart medication because she would like not to go to the ER. Patient had spoken of her heart medication when night time medications were administered and stated to this nurse that she would take in the morning. Upon reviewing chart, patient has Inderal scheduled for morning. When attempts were made to further inquire details regarding medications she was missing and now demanding she remains argumentative in response and seemingly refuses to directly answer questions inquired. NP Notified of patient's request for her heart medication and pain for her headache. Continuing to monitor.
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
## 2023-07-26 NOTE — ED Notes (Signed)
 Patient is alert & orineted X 3. She denies SI/HI or AVH. Denies anxiety, depression, or physical pain or discomfort. Pt states, I just want to go home. She is calm and cooperative. Patient took her medication and is currently eating breakfast. She denies any additional needs at this time. We will continue to monitor for safety.

## 2023-07-26 NOTE — ED Notes (Signed)
 Patient discharged home in no acute distress.Transported by her husband. Belongings returned from locker #5. AVS given and reviewed with pt voicing understanding.

## 2023-08-10 ENCOUNTER — Other Ambulatory Visit (HOSPITAL_COMMUNITY): Payer: Self-pay

## 2023-09-25 ENCOUNTER — Other Ambulatory Visit: Payer: Self-pay

## 2023-09-25 ENCOUNTER — Encounter: Payer: Self-pay | Admitting: Nurse Practitioner

## 2023-09-26 NOTE — Telephone Encounter (Signed)
 I do think she needs to have it looked at. She may need additional antibiotics. Please call to get her on the schedule.

## 2023-09-27 ENCOUNTER — Ambulatory Visit (INDEPENDENT_AMBULATORY_CARE_PROVIDER_SITE_OTHER): Admitting: Family Medicine

## 2023-09-27 VITALS — BP 124/82 | HR 75 | Wt 206.2 lb

## 2023-09-27 DIAGNOSIS — L02419 Cutaneous abscess of limb, unspecified: Secondary | ICD-10-CM | POA: Diagnosis not present

## 2023-09-27 NOTE — Progress Notes (Signed)
 Name: Christine Schneider   Date of Visit: 09/27/23   Date of last visit with me: Visit date not found   CHIEF COMPLAINT:  Chief Complaint  Patient presents with   Acute Visit    Check MRSA make sure everything is healing properly. Underarm. Has pictures of beginning middle and yesterday. In pain today, tender.        HPI:  Discussed the use of AI scribe software for clinical note transcription with the patient, who gave verbal consent to proceed.  History of Present Illness   Christine Schneider is a 33 year old female who presents with a persistent axillary abscess and recent nausea following a stomach bug.  She noticed two lumps under her axilla a few days before September 15, 2023. Initially, she thought one might be a cystic pimple and the other a lymph node. Considering the possibility of cat scratch disease due to having cats, she visited urgent care on September 15, 2023, where she was diagnosed with a skin infection and prescribed antibiotics. Despite treatment, the condition did not improve, leading to a drainage procedure. The culture returned positive for MRSA. As of the day before this visit, the abscess had stopped draining, although it remains tender. She has been using chlorhexidine  solution on sterile gauze twice daily to clean the area.  She completed her course of antibiotics the day before this visit. She has been using mupirocin  over the area and continues to monitor for signs of infection. No drainage was noted yesterday.  She also reports experiencing nausea and an upset stomach following a stomach bug that affected her household, starting with her son, then her daughter, and finally herself on Thursday night before this visit. She describes the nausea as constant and not significantly affected by eating. She has been taking oregano oil capsules and probiotics daily to manage her symptoms.  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         OBJECTIVE:        05/03/2021    3:33 PM  Depression screen PHQ 2/9  Decreased Interest 0  Down, Depressed, Hopeless 0  PHQ - 2 Score 0     BP Readings from Last 3 Encounters:  09/27/23 124/82  10/18/22 120/76  09/21/22 116/79    BP 124/82   Pulse 75   Wt 206 lb 3.2 oz (93.5 kg)   SpO2 98%   BMI 35.12 kg/m    Physical Exam   BREAST: No signs of cellulitis, redness, or drainage in the axilla. Healing well with tenderness present.      Physical Exam  Inspection reveals to noted incisions which appear to be well-healing.  There is still a palpable lymph node that is tender to palpation but no signs of any abscess.  No signs of any erythema spreading from the axilla and no signs of any persistent cellulitis.  No drainage noted. ASSESSMENT/PLAN:   Assessment & Plan    Assessment and Plan    Right axillary abscess, status post drainage, healing Abscess healing well post-drainage. No drainage or cellulitis. Palpable area likely lymph node. - Continue mupirocin  application. - Use chlorhexidine  solution with sterile gauze and saline for cleaning for another week. - Monitor for redness or recurrence. - Contact office for blood cultures if fever >100.54F. - Consider general surgery referral if abscess recurs.  Nausea following viral gastroenteritis Nausea likely residual from viral gastroenteritis. Not related to food intake. Viral symptoms may take up to  a month to resolve. Fever is a more significant indicator of infection. - Continue oregano oil capsules and probiotics daily. - Resume normal diet to assess tolerance. - Monitor for fever.         Shaunae Sieloff A. Vita MD [facility]

## 2023-09-28 ENCOUNTER — Ambulatory Visit (INDEPENDENT_AMBULATORY_CARE_PROVIDER_SITE_OTHER): Admitting: Family Medicine

## 2023-09-28 ENCOUNTER — Encounter: Payer: Self-pay | Admitting: Family Medicine

## 2023-09-28 ENCOUNTER — Other Ambulatory Visit: Payer: Self-pay

## 2023-09-28 VITALS — BP 118/82 | HR 97 | Wt 206.0 lb

## 2023-09-28 DIAGNOSIS — N3000 Acute cystitis without hematuria: Secondary | ICD-10-CM

## 2023-09-28 DIAGNOSIS — R309 Painful micturition, unspecified: Secondary | ICD-10-CM | POA: Diagnosis not present

## 2023-09-28 LAB — POCT URINE DIPSTICK
Blood, UA: NEGATIVE
Glucose, UA: NEGATIVE mg/dL
Ketones, POC UA: NEGATIVE mg/dL
Nitrite, UA: NEGATIVE
POC PROTEIN,UA: NEGATIVE
Spec Grav, UA: 1.025 (ref 1.010–1.025)
Urobilinogen, UA: 0.2 U/dL
pH, UA: 6 (ref 5.0–8.0)

## 2023-09-28 NOTE — Patient Instructions (Signed)
 Please go get clotrimazole from the pharmacy and apply it to the affected area

## 2023-09-28 NOTE — Progress Notes (Signed)
   Name: Christine Schneider   Date of Visit: 09/28/23   Date of last visit with me: 09/27/2023   CHIEF COMPLAINT:  Chief Complaint  Patient presents with   Acute Visit    UTI. A lot of burning sensation.        HPI:  Discussed the use of AI scribe software for clinical note transcription with the patient, who gave verbal consent to proceed.  History of Present Illness   Christine Schneider is a 33 year old female who presents with a burning sensation around the clitoris and urethra.  She experiences a persistent burning sensation around the clitoris and urethra, which does not affect the vaginal area. The sensation is described as 'like burning fire down there' and is present even while sitting.  She recently completed a course of antibiotics, which she suspects may have disrupted her normal vaginal flora. Despite this, she has not noticed any abnormal vaginal discharge or changes in smell.  She drinks a lot of water and has not used any specific medications for this issue yet.         OBJECTIVE:       05/03/2021    3:33 PM  Depression screen PHQ 2/9  Decreased Interest 0  Down, Depressed, Hopeless 0  PHQ - 2 Score 0     BP Readings from Last 3 Encounters:  09/28/23 118/82  09/27/23 124/82  10/18/22 120/76    BP 118/82   Pulse 97   Wt 206 lb (93.4 kg)   SpO2 99%   BMI 35.09 kg/m    Physical Exam          Physical Exam Constitutional:      Appearance: Normal appearance.  Neurological:     Mental Status: She is alert.  Psychiatric:        Mood and Affect: Mood normal.        Behavior: Behavior normal.        Thought Content: Thought content normal.     ASSESSMENT/PLAN:   Assessment & Plan Acute cystitis without hematuria  Urinary pain    Assessment and Plan    Burning and pain in vulvar and periurethral area Burning around clitoris and urethra. No bacterial infection in urine. -Recent abx use due to MRSA abscess of axilla  - Perform vaginal  swab for infection. - POCT UA unremarkable.  - Apply OTC clotrimazole to affected area. - Use Azo pills for symptom relief. - Maintain hydration. - Report if no improvement. - Consider speculum exam if symptoms persist.         Leeandra Ellerson A. Vita MD [facility]

## 2023-10-01 LAB — NUSWAB VAGINITIS PLUS (VG+)
Candida albicans, NAA: NEGATIVE
Candida glabrata, NAA: NEGATIVE
Chlamydia trachomatis, NAA: NEGATIVE
Neisseria gonorrhoeae, NAA: NEGATIVE
Trich vag by NAA: NEGATIVE

## 2023-10-03 ENCOUNTER — Ambulatory Visit: Payer: Self-pay | Admitting: Family Medicine

## 2023-11-21 ENCOUNTER — Encounter: Payer: Self-pay | Admitting: Family Medicine

## 2023-11-28 ENCOUNTER — Ambulatory Visit (INDEPENDENT_AMBULATORY_CARE_PROVIDER_SITE_OTHER): Admitting: Nurse Practitioner

## 2023-11-28 VITALS — BP 130/78 | HR 99 | Wt 208.8 lb

## 2023-11-28 DIAGNOSIS — Z6835 Body mass index (BMI) 35.0-35.9, adult: Secondary | ICD-10-CM

## 2023-11-28 DIAGNOSIS — Z0000 Encounter for general adult medical examination without abnormal findings: Secondary | ICD-10-CM | POA: Diagnosis not present

## 2023-11-28 DIAGNOSIS — E611 Iron deficiency: Secondary | ICD-10-CM

## 2023-11-28 DIAGNOSIS — R232 Flushing: Secondary | ICD-10-CM

## 2023-11-28 DIAGNOSIS — G4452 New daily persistent headache (NDPH): Secondary | ICD-10-CM

## 2023-11-28 DIAGNOSIS — R5382 Chronic fatigue, unspecified: Secondary | ICD-10-CM

## 2023-11-28 DIAGNOSIS — E782 Mixed hyperlipidemia: Secondary | ICD-10-CM | POA: Diagnosis not present

## 2023-11-28 DIAGNOSIS — I471 Supraventricular tachycardia, unspecified: Secondary | ICD-10-CM | POA: Diagnosis not present

## 2023-11-28 DIAGNOSIS — I10 Essential (primary) hypertension: Secondary | ICD-10-CM

## 2023-11-28 DIAGNOSIS — E282 Polycystic ovarian syndrome: Secondary | ICD-10-CM

## 2023-11-28 DIAGNOSIS — G4733 Obstructive sleep apnea (adult) (pediatric): Secondary | ICD-10-CM

## 2023-11-28 DIAGNOSIS — R7303 Prediabetes: Secondary | ICD-10-CM

## 2023-11-28 MED ORDER — HYDROCHLOROTHIAZIDE 12.5 MG PO TABS: 12.5000 mg | ORAL_TABLET | Freq: Every day | ORAL | 0 refills | Status: DC

## 2023-11-28 NOTE — Patient Instructions (Signed)
 Hypertension, Adult High blood pressure (hypertension) is when the force of blood pumping through the arteries is too strong. The arteries are the blood vessels that carry blood from the heart throughout the body. Hypertension forces the heart to work harder to pump blood and may cause arteries to become narrow or stiff. Untreated or uncontrolled hypertension can lead to a heart attack, heart failure, a stroke, kidney disease, and other problems. A blood pressure reading consists of a higher number over a lower number. Ideally, your blood pressure should be below 120/80. The first ("top") number is called the systolic pressure. It is a measure of the pressure in your arteries as your heart beats. The second ("bottom") number is called the diastolic pressure. It is a measure of the pressure in your arteries as the heart relaxes. What are the causes? The exact cause of this condition is not known. There are some conditions that result in high blood pressure. What increases the risk? Certain factors may make you more likely to develop high blood pressure. Some of these risk factors are under your control, including: Smoking. Not getting enough exercise or physical activity. Being overweight. Having too much fat, sugar, calories, or salt (sodium) in your diet. Drinking too much alcohol. Other risk factors include: Having a personal history of heart disease, diabetes, high cholesterol, or kidney disease. Stress. Having a family history of high blood pressure and high cholesterol. Having obstructive sleep apnea. Age. The risk increases with age. What are the signs or symptoms? High blood pressure may not cause symptoms. Very high blood pressure (hypertensive crisis) may cause: Headache. Fast or irregular heartbeats (palpitations). Shortness of breath. Nosebleed. Nausea and vomiting. Vision changes. Severe chest pain, dizziness, and seizures. How is this diagnosed? This condition is diagnosed by  measuring your blood pressure while you are seated, with your arm resting on a flat surface, your legs uncrossed, and your feet flat on the floor. The cuff of the blood pressure monitor will be placed directly against the skin of your upper arm at the level of your heart. Blood pressure should be measured at least twice using the same arm. Certain conditions can cause a difference in blood pressure between your right and left arms. If you have a high blood pressure reading during one visit or you have normal blood pressure with other risk factors, you may be asked to: Return on a different day to have your blood pressure checked again. Monitor your blood pressure at home for 1 week or longer. If you are diagnosed with hypertension, you may have other blood or imaging tests to help your health care provider understand your overall risk for other conditions. How is this treated? This condition is treated by making healthy lifestyle changes, such as eating healthy foods, exercising more, and reducing your alcohol intake. You may be referred for counseling on a healthy diet and physical activity. Your health care provider may prescribe medicine if lifestyle changes are not enough to get your blood pressure under control and if: Your systolic blood pressure is above 130. Your diastolic blood pressure is above 80. Your personal target blood pressure may vary depending on your medical conditions, your age, and other factors. Follow these instructions at home: Eating and drinking  Eat a diet that is high in fiber and potassium, and low in sodium, added sugar, and fat. An example of this eating plan is called the DASH diet. DASH stands for Dietary Approaches to Stop Hypertension. To eat this way: Eat  plenty of fresh fruits and vegetables. Try to fill one half of your plate at each meal with fruits and vegetables. Eat whole grains, such as whole-wheat pasta, brown rice, or whole-grain bread. Fill about one  fourth of your plate with whole grains. Eat or drink low-fat dairy products, such as skim milk or low-fat yogurt. Avoid fatty cuts of meat, processed or cured meats, and poultry with skin. Fill about one fourth of your plate with lean proteins, such as fish, chicken without skin, beans, eggs, or tofu. Avoid pre-made and processed foods. These tend to be higher in sodium, added sugar, and fat. Reduce your daily sodium intake. Many people with hypertension should eat less than 1,500 mg of sodium a day. Do not drink alcohol if: Your health care provider tells you not to drink. You are pregnant, may be pregnant, or are planning to become pregnant. If you drink alcohol: Limit how much you have to: 0-1 drink a day for women. 0-2 drinks a day for men. Know how much alcohol is in your drink. In the [place] one drink equals one 12 oz bottle of beer (355 mL), one 5 oz glass of wine (148 mL), or one 1 oz glass of hard liquor (44 mL). Lifestyle  Work with your health care provider to maintain a healthy body weight or to lose weight. Ask what an ideal weight is for you. Get at least 30 minutes of exercise that causes your heart to beat faster (aerobic exercise) most days of the week. Activities may include walking, swimming, or biking. Include exercise to strengthen your muscles (resistance exercise), such as Pilates or lifting weights, as part of your weekly exercise routine. Try to do these types of exercises for 30 minutes at least 3 days a week. Do not use any products that contain nicotine or tobacco. These products include cigarettes, chewing tobacco, and vaping devices, such as e-cigarettes. If you need help quitting, ask your health care provider. Monitor your blood pressure at home as told by your health care provider. Keep all follow-up visits. This is important. Medicines Take over-the-counter and prescription medicines only as told by your health care provider. Follow directions carefully. Blood  pressure medicines must be taken as prescribed. Do not skip doses of blood pressure medicine. Doing this puts you at risk for problems and can make the medicine less effective. Ask your health care provider about side effects or reactions to medicines that you should watch for. Contact a health care provider if you: Think you are having a reaction to a medicine you are taking. Have headaches that keep coming back (recurring). Feel dizzy. Have swelling in your ankles. Have trouble with your vision. Get help right away if you: Develop a severe headache or confusion. Have unusual weakness or numbness. Feel faint. Have severe pain in your chest or abdomen. Vomit repeatedly. Have trouble breathing. These symptoms may be an emergency. Get help right away. Call 911. Do not wait to see if the symptoms will go away. Do not drive yourself to the hospital. Summary Hypertension is when the force of blood pumping through your arteries is too strong. If this condition is not controlled, it may put you at risk for serious complications. Your personal target blood pressure may vary depending on your medical conditions, your age, and other factors. For most people, a normal blood pressure is less than 120/80. Hypertension is treated with lifestyle changes, medicines, or a combination of both. Lifestyle changes include losing weight, eating a healthy,  low-sodium diet, exercising more, and limiting alcohol. This information is not intended to replace advice given to you by your health care provider. Make sure you discuss any questions you have with your health care provider. Document Revised: 11/24/2020 Document Reviewed: 11/24/2020 Elsevier Patient Education  2024 [employer].

## 2023-11-28 NOTE — Progress Notes (Unsigned)
 Camie FORBES Doing, DNP, AGNP-c [facility] [address] [place], [place] [address] [phone]   ACUTE VISIT on 11/28/2023  Blood pressure 130/78, pulse 99, weight 208 lb 12.8 oz (94.7 kg).  Subjective:  HPI History of Present Illness Christine Schneider is a 33 year old female with hypertension who presents with concerns about elevated blood pressure and associated symptoms.  She has been monitoring her blood pressure at home, with readings ranging from 120/80 mmHg to 140/90 mmHg. Approximately 40% of her readings are above 140/90 mmHg, 30% are above 130/85 mmHg, and 20% are 120/80 mmHg or lower. Historically, her blood pressure has been consistently below 120/80 mmHg.  She experiences daily headaches, sometimes accompanied by chest pressure, dizziness, and visual disturbances described as 'lightning sparks.' Fatigue and brain fog are also present. The headaches do not respond to ibuprofen  or Tylenol , but she finds some relief using peppermint oil on her temples.  There is a family history of hypertension, diabetes, and high cholesterol. She has experienced hot flashes and night sweats recently. She has spot-checked her glucose levels twice, with readings of 120 mg/dL and 872 mg/dL. She has a history of gestational diabetes during both pregnancies, which was insulin-dependent.  Her sleep pattern includes more than eight hours on weekdays and over ten hours on weekends, but she does not feel rested upon waking. Her husband reports that she snores, although her Apple Watch and phone do not detect it. She has a history of moderate obstructive sleep apnea diagnosed two to three years ago, but she did not pursue CPAP therapy due to concerns about compliance.  Her menstrual cycle is irregular, with a recent pattern of three months without a period followed by a heavy one. She has been spotting intermittently. She has a history of polycystic ovary syndrome (PCOS), although a recent  ultrasound did not show signs of it.  She mentions a history of mental health challenges, for which she is under the care of a psychologist and psychiatrist.  BP at home reading 120-140's/ 80-90's Per her home report: 40% >-=140/90, 30%130/85, 20% 120/80 or less.  Reports her BP typically is less than 120/80. She measures her sleep and gets >8 hours during the week and >10 hours on the weekends Has been having headaches, believed to be related to BP  Having hot flashes and night sweats. Has checked her BG twice and once it was 120 and 127.  Both sides have HTN, HLD, DM  Chest pressure/tightness worse one day but intermittently experiencing. Not as bad.  Psych feels cortisol is a concern Weight not changing Has not gone into panic attack  Seeing sparks at time with HTN. Some dizziness and instability, but not severe.  Having a lot of brain fog  Sleep study in 2023 showed moderate obstructive sleep apnea. They recommended CPAP. She did not get this because of concern that she could not wear it during the night.   ROS negative except for what is listed in HPI. History, Medications, Surgery, SDOH, and Family History reviewed and updated as appropriate.  Objective:  Physical Exam Vitals and nursing note reviewed.  Constitutional:      General: She is not in acute distress.    Appearance: Normal appearance.  HENT:     Head: Normocephalic.     Nose: Nose normal.     Mouth/Throat:     Mouth: Mucous membranes are moist.     Pharynx: Oropharynx is clear.  Eyes:     General: Lids  are normal. No scleral icterus.    Conjunctiva/sclera: Conjunctivae normal.     Pupils: Pupils are equal, round, and reactive to light.     Funduscopic exam:    Right eye: No hemorrhage or exudate. Red reflex present.        Left eye: No hemorrhage or exudate. Red reflex present. Neck:     Vascular: No carotid bruit.  Cardiovascular:     Rate and Rhythm: Normal rate and regular rhythm.     Pulses:  Normal pulses.     Heart sounds: Normal heart sounds.  Pulmonary:     Effort: Pulmonary effort is normal.     Breath sounds: Normal breath sounds.  Abdominal:     General: Bowel sounds are normal. There is no distension.     Palpations: Abdomen is soft.     Tenderness: There is no abdominal tenderness. There is no right CVA tenderness, left CVA tenderness or guarding.  Musculoskeletal:     Cervical back: Neck supple. No tenderness.     Right lower leg: No edema.     Left lower leg: No edema.  Lymphadenopathy:     Cervical: No cervical adenopathy.  Skin:    General: Skin is warm and dry.     Capillary Refill: Capillary refill takes less than 2 seconds.  Neurological:     General: No focal deficit present.     Mental Status: She is alert and oriented to person, place, and time.     Sensory: No sensory deficit.     Motor: No weakness.     Coordination: Coordination normal.     Gait: Gait normal.  Psychiatric:        Mood and Affect: Mood normal.         Assessment & Plan:   Problem List Items Addressed This Visit   None  Hypertension Hypertension with recent home blood pressure readings showing 40% greater than 140/90, 30% greater than 130/85, and 20% in the 120/80 range or less. Historically, blood pressure has been less than 120/80. Associated symptoms include headaches and intermittent chest pressure. Family history of hypertension. Differential includes possible contribution from obstructive sleep apnea. - Start hydrochlorothiazide 12.5 mg daily with option to increase to 25 mg if blood pressure is not consistently at 120/80 or less after one week. - Monitor blood pressure at home and report if symptoms persist or worsen.  Headache Daily headaches with associated dizziness, visual disturbances (sparks), and brain fog. Headaches are a new daily occurrence, previously rare. Differential includes hypertension and sleep apnea as contributing factors. - Monitor response to  hydrochlorothiazide for potential improvement in headache frequency and severity. - Consider additional headache management if symptoms persist despite blood pressure control.  Obstructive sleep apnea, moderate Moderate obstructive sleep apnea with 15 apneic events per hour. Symptoms include fatigue and non-restorative sleep. Difficulty tolerating CPAP due to removing it during sleep. Potential contribution to hypertension and weight issues. - Check if insurance will cover Zepbound for sleep apnea management. - Consider alternative treatments if Zepbound is not covered, such as Inspire device, though insurance approval is slow.  Obesity Obesity with weight between 203-210 lbs despite efforts to lose weight. Gestational diabetes and insulin resistance may contribute. Potential contribution from sleep apnea and insulin resistance. - Check labs for cortisol, thyroid  function, and insulin levels to rule out contributing factors. - Explore insurance coverage for Zepbound for weight management in context of sleep apnea.  Insulin resistance/Pre-Diabetes Insulin resistance with previous gestational diabetes. Family  history of diabetes. Recent glucose spot checks showed 120 and 127 mg/dL. Potential contributor to weight issues and menstrual irregularities. - Order labs to assess current insulin levels and glucose control.  Irregular menstruation and menorrhagia/PCOS Irregular menstrual cycles with recent heavy periods and spotting. History of PCOS, though recent ultrasound did not show indicators. Possible hormonal imbalance or insulin resistance contributing to symptoms.  Annual Exam CPE completed today. Review of HM activities and recommendations discussed and provided on AVS. Anticipatory guidance, diet, and exercise recommendations provided. Medications, allergies, and hx reviewed and updated as necessary. Orders placed as listed below.  Plan: - Labs ordered. Will make changes as necessary based on  results.  - I will review these results and send recommendations via MyChart or a telephone call.  - F/U with CPE in 1 year or sooner for acute/chronic health needs as directed.    Camie FORBES Doing, DNP, AGNP-c Time: 46 minutes, >50% spent counseling, care coordination, chart review, and documentation.

## 2023-11-29 ENCOUNTER — Encounter: Payer: Self-pay | Admitting: Nurse Practitioner

## 2023-11-29 DIAGNOSIS — G4733 Obstructive sleep apnea (adult) (pediatric): Secondary | ICD-10-CM

## 2023-11-29 LAB — CBC WITH DIFFERENTIAL/PLATELET
Basophils Absolute: 0.1 x10E3/uL (ref 0.0–0.2)
Basos: 1 %
EOS (ABSOLUTE): 0.1 x10E3/uL (ref 0.0–0.4)
Eos: 1 %
Hematocrit: 41.2 % (ref 34.0–46.6)
Hemoglobin: 14 g/dL (ref 11.1–15.9)
Immature Grans (Abs): 0 x10E3/uL (ref 0.0–0.1)
Immature Granulocytes: 0 %
Lymphocytes Absolute: 1.8 x10E3/uL (ref 0.7–3.1)
Lymphs: 23 %
MCH: 32 pg (ref 26.6–33.0)
MCHC: 34 g/dL (ref 31.5–35.7)
MCV: 94 fL (ref 79–97)
Monocytes Absolute: 0.4 x10E3/uL (ref 0.1–0.9)
Monocytes: 6 %
Neutrophils Absolute: 5.6 x10E3/uL (ref 1.4–7.0)
Neutrophils: 69 %
Platelets: 363 x10E3/uL (ref 150–450)
RBC: 4.38 x10E6/uL (ref 3.77–5.28)
RDW: 11.8 % (ref 11.7–15.4)
WBC: 8.1 x10E3/uL (ref 3.4–10.8)

## 2023-11-29 LAB — LIPID PANEL
Chol/HDL Ratio: 8.9 ratio — ABNORMAL HIGH (ref 0.0–4.4)
Cholesterol, Total: 277 mg/dL — ABNORMAL HIGH (ref 100–199)
HDL: 31 mg/dL — ABNORMAL LOW (ref >39)
LDL Chol Calc (NIH): 193 mg/dL — ABNORMAL HIGH (ref 0–99)
Triglycerides: 267 mg/dL — ABNORMAL HIGH (ref 0–149)
VLDL Cholesterol Cal: 53 mg/dL — ABNORMAL HIGH (ref 5–40)

## 2023-11-29 LAB — TSH+FREE T4
Free T4: 1.22 ng/dL (ref 0.82–1.77)
TSH: 1.45 u[IU]/mL (ref 0.450–4.500)

## 2023-11-29 LAB — COMPREHENSIVE METABOLIC PANEL WITH GFR
ALT: 44 IU/L — ABNORMAL HIGH (ref 0–32)
AST: 16 IU/L (ref 0–40)
Albumin: 4.3 g/dL (ref 3.9–4.9)
Alkaline Phosphatase: 121 IU/L — ABNORMAL HIGH (ref 41–116)
BUN/Creatinine Ratio: 14 (ref 9–23)
BUN: 11 mg/dL (ref 6–20)
Bilirubin Total: 0.2 mg/dL (ref 0.0–1.2)
CO2: 21 mmol/L (ref 20–29)
Calcium: 10 mg/dL (ref 8.7–10.2)
Chloride: 100 mmol/L (ref 96–106)
Creatinine, Ser: 0.76 mg/dL (ref 0.57–1.00)
Globulin, Total: 2.8 g/dL (ref 1.5–4.5)
Glucose: 97 mg/dL (ref 70–99)
Potassium: 4.4 mmol/L (ref 3.5–5.2)
Sodium: 137 mmol/L (ref 134–144)
Total Protein: 7.1 g/dL (ref 6.0–8.5)
eGFR: 106 mL/min/1.73 (ref >59)

## 2023-11-29 LAB — IRON,TIBC AND FERRITIN PANEL
Ferritin: 68 ng/mL (ref 15–150)
Iron Saturation: 26 % (ref 15–55)
Iron: 99 ug/dL (ref 27–159)
Total Iron Binding Capacity: 383 ug/dL (ref 250–450)
UIBC: 284 ug/dL (ref 131–425)

## 2023-11-29 LAB — THYROID PEROXIDASE ANTIBODY: Thyroperoxidase Ab SerPl-aCnc: 9 [IU]/mL (ref 0–34)

## 2023-11-29 LAB — HEMOGLOBIN A1C
Est. average glucose Bld gHb Est-mCnc: 120 mg/dL
Hgb A1c MFr Bld: 5.8 % — ABNORMAL HIGH (ref 4.8–5.6)

## 2023-11-29 LAB — INSULIN, RANDOM: INSULIN: 21.9 u[IU]/mL (ref 2.6–24.9)

## 2023-11-29 LAB — VITAMIN D 25 HYDROXY (VIT D DEFICIENCY, FRACTURES): Vit D, 25-Hydroxy: 50 ng/mL (ref 30.0–100.0)

## 2023-11-29 LAB — THYROID STIMULATING IMMUNOGLOBULIN: Thyroid Stim Immunoglobulin: <0.1 IU/L (ref 0.00–0.55)

## 2023-11-29 LAB — CORTISOL: Cortisol: 7.9 ug/dL (ref 6.2–19.4)

## 2023-12-01 ENCOUNTER — Encounter: Payer: Self-pay | Admitting: Nurse Practitioner

## 2023-12-01 DIAGNOSIS — E782 Mixed hyperlipidemia: Secondary | ICD-10-CM

## 2023-12-01 DIAGNOSIS — Z6835 Body mass index (BMI) 35.0-35.9, adult: Secondary | ICD-10-CM

## 2023-12-01 DIAGNOSIS — R7303 Prediabetes: Secondary | ICD-10-CM

## 2023-12-01 DIAGNOSIS — G4733 Obstructive sleep apnea (adult) (pediatric): Secondary | ICD-10-CM

## 2023-12-05 ENCOUNTER — Encounter: Payer: Self-pay | Admitting: Nurse Practitioner

## 2023-12-05 ENCOUNTER — Ambulatory Visit: Payer: Self-pay | Admitting: Nurse Practitioner

## 2023-12-05 DIAGNOSIS — E782 Mixed hyperlipidemia: Secondary | ICD-10-CM

## 2023-12-06 ENCOUNTER — Other Ambulatory Visit: Payer: Self-pay | Admitting: Medical Genetics

## 2023-12-06 ENCOUNTER — Other Ambulatory Visit (HOSPITAL_COMMUNITY): Payer: Self-pay

## 2023-12-06 ENCOUNTER — Telehealth (HOSPITAL_COMMUNITY): Payer: Self-pay

## 2023-12-06 MED ORDER — ZEPBOUND 2.5 MG/0.5ML ~~LOC~~ SOAJ
2.5000 mg | SUBCUTANEOUS | 1 refills | Status: DC
  Filled 2023-12-06 – 2023-12-07 (×2): qty 2, 28d supply, fill #0

## 2023-12-07 ENCOUNTER — Telehealth (HOSPITAL_COMMUNITY): Payer: Self-pay

## 2023-12-07 ENCOUNTER — Other Ambulatory Visit (HOSPITAL_COMMUNITY): Payer: Self-pay

## 2023-12-07 MED ORDER — ROSUVASTATIN CALCIUM 20 MG PO TABS
20.0000 mg | ORAL_TABLET | ORAL | 3 refills | Status: DC
  Filled 2023-12-07: qty 36, 84d supply, fill #0

## 2023-12-07 NOTE — Telephone Encounter (Signed)
 Pharmacy Patient Advocate Encounter   Received notification from Pt Calls Messages that prior authorization for Zepbound 2.5 mg/0.5 ml auto injectors is required/requested.   Insurance verification completed.   The patient is insured through [facility] MEDICAID.   Per test claim: Effective October 1st, Medicaid discontinued coverage of GLP1 medications for weight loss (such as Wegovy  and Zepbound), unless the patient has a documented history of a heart attack or stroke. Zepbound will continue to be covered only for patients with moderate to severe sleep apnea (AHI 15-30) and a BMI greater than 40. Because of this change, the prior authorization team will not be submitting new PA requests for GLP1 medications prescribed for weight loss, as patients will be unable to continue therapy under Medicaid coverage.  *I see she has OSA with a AHI of 15 but Medicaid requires a BMI of 40 or above and her BMI is only 35 based on recent chart notes

## 2023-12-07 NOTE — Telephone Encounter (Signed)
 PA request has been Received. New Encounter has been or will be created for follow up. For additional info see Pharmacy Prior Auth telephone encounter from 12/07/23.

## 2023-12-08 ENCOUNTER — Other Ambulatory Visit: Payer: Self-pay

## 2023-12-08 DIAGNOSIS — R7303 Prediabetes: Secondary | ICD-10-CM

## 2023-12-08 DIAGNOSIS — E782 Mixed hyperlipidemia: Secondary | ICD-10-CM

## 2023-12-19 ENCOUNTER — Other Ambulatory Visit (HOSPITAL_COMMUNITY): Payer: Self-pay

## 2023-12-20 ENCOUNTER — Encounter: Admitting: Family Medicine

## 2024-01-08 LAB — STATUS REPORT

## 2024-01-12 LAB — TESTT+DHEA S+PROG+E2+E1
Free Testosterone, Serum: 2.3 pg/mL
Progesterone, Serum: <20 ng/dL
Sex Hormone Binding Globulin: 83.1 nmol/L
Testosterone, Serum (Total): 25 ng/dL
Testosterone-% Free: 0.9 %

## 2024-01-12 LAB — FSH/LH
FSH: 4.1 m[IU]/mL
LH: 5.7 m[IU]/mL

## 2024-01-12 LAB — SPECIMEN STATUS REPORT

## 2024-01-16 ENCOUNTER — Other Ambulatory Visit (HOSPITAL_COMMUNITY): Payer: Self-pay

## 2024-01-16 ENCOUNTER — Ambulatory Visit (INDEPENDENT_AMBULATORY_CARE_PROVIDER_SITE_OTHER): Payer: Self-pay | Admitting: Nurse Practitioner

## 2024-01-16 ENCOUNTER — Encounter: Payer: Self-pay | Admitting: Nurse Practitioner

## 2024-01-16 ENCOUNTER — Telehealth: Payer: Self-pay

## 2024-01-16 VITALS — BP 130/80 | HR 83 | Wt 208.4 lb

## 2024-01-16 DIAGNOSIS — L659 Nonscarring hair loss, unspecified: Secondary | ICD-10-CM | POA: Diagnosis not present

## 2024-01-16 DIAGNOSIS — F3131 Bipolar disorder, current episode depressed, mild: Secondary | ICD-10-CM

## 2024-01-16 DIAGNOSIS — I10 Essential (primary) hypertension: Secondary | ICD-10-CM | POA: Diagnosis not present

## 2024-01-16 DIAGNOSIS — N926 Irregular menstruation, unspecified: Secondary | ICD-10-CM

## 2024-01-16 DIAGNOSIS — Z6835 Body mass index (BMI) 35.0-35.9, adult: Secondary | ICD-10-CM

## 2024-01-16 DIAGNOSIS — E282 Polycystic ovarian syndrome: Secondary | ICD-10-CM | POA: Diagnosis not present

## 2024-01-16 DIAGNOSIS — E782 Mixed hyperlipidemia: Secondary | ICD-10-CM

## 2024-01-16 MED ORDER — LITHIUM CARBONATE 150 MG PO CAPS: 450.0000 mg | ORAL_CAPSULE | Freq: Three times a day (TID) | ORAL | Status: AC

## 2024-01-16 MED ORDER — CONTRAVE 8-90 MG PO TB12: ORAL_TABLET | ORAL | 0 refills | Status: AC

## 2024-01-16 MED ORDER — ROSUVASTATIN CALCIUM 20 MG PO TABS: 20.0000 mg | ORAL_TABLET | ORAL | 3 refills | Status: AC

## 2024-01-16 MED ORDER — VALSARTAN-HYDROCHLOROTHIAZIDE 80-12.5 MG PO TABS: 1.0000 | ORAL_TABLET | Freq: Every day | ORAL | 1 refills | Status: AC

## 2024-01-16 NOTE — Assessment & Plan Note (Signed)
°  Orders:   DHEA-Sulfate, Serum   17-Hydroxyprogesterone   Estradiol    FSH/LH   TestT+TestF+SHBG

## 2024-01-16 NOTE — Assessment & Plan Note (Signed)
°  Orders:   valsartan -hydrochlorothiazide  (DIOVAN -HCT) 80-12.5 MG tablet; Take 1 tablet by mouth daily.   Comprehensive metabolic panel with GFR

## 2024-01-16 NOTE — Patient Instructions (Addendum)
 We will plan to keep the cardiac calcium  CT on the radar.   I have sent in the Contrave  to see if we can get coverage for this. This is bupropion and naltrexone combination.  Start with 1 tablet daily x 7 days then 1 tablet twice a day for 7 days then 2 tablets twice a day.   If this is not covered, we can look at Topiramate and wellbutrin to see if we can get coverage.   I have sent in valsartan -hydrochlorothiazide  combo for BP. Keep a check and let me know if this is running higher than 130/85 on average.

## 2024-01-16 NOTE — Telephone Encounter (Signed)
 Pharmacy Patient Advocate Encounter   Received notification from Onbase that prior authorization for Naltrexone-buPROPion HCl ER (CONTRAVE ) 8-90 MG TB12  is required/requested.   Insurance verification completed.   The patient is insured through [facility].   Per test claim: PA required; PA submitted to above mentioned insurance via Latent Key/confirmation #/EOC [id number] Status is pending

## 2024-01-16 NOTE — Assessment & Plan Note (Signed)
°  Orders:   lithium  carbonate 150 MG capsule; Take 3 capsules (450 mg total) by mouth 3 (three) times daily with meals. Prescribed by Psychiatry

## 2024-01-16 NOTE — Progress Notes (Unsigned)
{  SEHM (Optional):34217:p}  Catheline Doing, DNP, AGNP-c [facility]  [address] [place], [place] [address] [phone]   ESTABLISHED PATIENT- Chronic Health and/or Follow-Up Visit on 01/16/2024  Blood pressure 130/80, pulse 83, weight 208 lb 6.4 oz (94.5 kg).   Subjective:  Medical Management of Chronic Issues (6 week f/u on BP, been running high at home, pt. Reports: has been around 130-140, also check labs that were not able to be ran last time she was here. )   *** ROS negative except for what is listed in HPI. History, Medications, Surgery, SDOH, and Family History reviewed and updated as appropriate.  Objective:  Physical Exam Vitals and nursing note reviewed.  Constitutional:      Appearance: Normal appearance.  HENT:     Head: Normocephalic.  Eyes:     Pupils: Pupils are equal, round, and reactive to light.  Cardiovascular:     Rate and Rhythm: Normal rate and regular rhythm.     Pulses: Normal pulses.     Heart sounds: Normal heart sounds.  Pulmonary:     Effort: Pulmonary effort is normal.     Breath sounds: Normal breath sounds.  Musculoskeletal:        General: Normal range of motion.     Cervical back: Normal range of motion.  Skin:    General: Skin is warm.  Neurological:     General: No focal deficit present.     Mental Status: She is alert and oriented to person, place, and time.  Psychiatric:        Mood and Affect: Mood normal.         Assessment & Plan:   Assessment & Plan PCOS (polycystic ovarian syndrome)  Orders:   DHEA-Sulfate, Serum   17-Hydroxyprogesterone   Estradiol    FSH/LH   TestT+TestF+SHBG  Menstrual abnormality  Orders:   DHEA-Sulfate, Serum   17-Hydroxyprogesterone   Estradiol    FSH/LH  Primary hypertension  Orders:   valsartan -hydrochlorothiazide  (DIOVAN -HCT) 80-12.5 MG tablet; Take 1 tablet by mouth daily.   Comprehensive metabolic panel with GFR  Hair loss  Orders:   DHEA-Sulfate,  Serum   17-Hydroxyprogesterone   Estradiol    FSH/LH   TestT+TestF+SHBG  Bipolar I disorder, most recent episode depressed, mild (HCC)  Orders:   lithium  carbonate 150 MG capsule; Take 3 capsules (450 mg total) by mouth 3 (three) times daily with meals. Prescribed by Psychiatry  BMI 35.0-35.9,adult  Orders:   Naltrexone-buPROPion HCl ER (CONTRAVE ) 8-90 MG TB12; Start 1 tablet every morning for 7 days, then 1 tablet twice daily for 7 days, then 2 tablets every morning and one every evening  Mixed hyperlipidemia  Orders:   rosuvastatin  (CRESTOR ) 20 MG tablet; Take 1 tablet (20 mg total) by mouth 3 (three) times a week.    Camie FORBES Doing, DNP, AGNP-c  {SETIMEYorN (Optional):34216}

## 2024-01-16 NOTE — Assessment & Plan Note (Signed)
°  Orders:   rosuvastatin  (CRESTOR ) 20 MG tablet; Take 1 tablet (20 mg total) by mouth 3 (three) times a week.

## 2024-01-16 NOTE — Assessment & Plan Note (Signed)
°  Orders:   Naltrexone-buPROPion HCl ER (CONTRAVE ) 8-90 MG TB12; Start 1 tablet every morning for 7 days, then 1 tablet twice daily for 7 days, then 2 tablets every morning and one every evening

## 2024-01-17 NOTE — Telephone Encounter (Signed)
 Pharmacy Patient Advocate Encounter  Received notification from [employer] that Prior Authorization for Contrave  8-90MG  er tablets has been APPROVED  to 4.16.26. SABRA This test claim was processed through [facility]- copay amounts may vary at other pharmacies due to pharmacy/plan contracts, or as the patient moves through the different stages of their insurance plan.   PA #/Case ID/Reference #: [id number]

## 2024-01-22 LAB — COMPREHENSIVE METABOLIC PANEL WITH GFR
ALT: 34 IU/L — ABNORMAL HIGH (ref 0–32)
AST: 22 IU/L (ref 0–40)
Albumin: 4.6 g/dL (ref 3.9–4.9)
Alkaline Phosphatase: 82 IU/L (ref 41–116)
BUN/Creatinine Ratio: 15 (ref 9–23)
BUN: 12 mg/dL (ref 6–20)
Bilirubin Total: 0.2 mg/dL (ref 0.0–1.2)
CO2: 25 mmol/L (ref 20–29)
Calcium: 9.5 mg/dL (ref 8.7–10.2)
Chloride: 100 mmol/L (ref 96–106)
Creatinine, Ser: 0.81 mg/dL (ref 0.57–1.00)
Globulin, Total: 2.3 g/dL (ref 1.5–4.5)
Glucose: 103 mg/dL — ABNORMAL HIGH (ref 70–99)
Potassium: 3.8 mmol/L (ref 3.5–5.2)
Sodium: 140 mmol/L (ref 134–144)
Total Protein: 6.9 g/dL (ref 6.0–8.5)
eGFR: 98 mL/min/1.73 (ref >59)

## 2024-01-22 LAB — ESTRADIOL: Estradiol: 29.2 pg/mL

## 2024-01-22 LAB — TESTT+TESTF+SHBG
Sex Hormone Binding: 37.9 nmol/L (ref 24.6–122.0)
Testosterone, Free: 1.4 pg/mL (ref 0.0–4.2)
Testosterone, Total, LC/MS: 22.3 ng/dL (ref 10.0–55.0)

## 2024-01-22 LAB — 17-HYDROXYPROGESTERONE: 17-OH Progesterone LCMS: 21 ng/dL

## 2024-01-22 LAB — FSH/LH
FSH: 5 m[IU]/mL
LH: 5.8 m[IU]/mL

## 2024-01-22 LAB — DHEA-SULFATE, SERUM: DHEA-Sulfate, LCMS: 106 ug/dL

## 2024-01-24 ENCOUNTER — Ambulatory Visit: Payer: Self-pay | Admitting: Nurse Practitioner

## 2024-02-28 ENCOUNTER — Other Ambulatory Visit: Payer: Self-pay | Admitting: Medical Genetics

## 2024-02-28 DIAGNOSIS — Z006 Encounter for examination for normal comparison and control in clinical research program: Secondary | ICD-10-CM

## 2024-03-06 ENCOUNTER — Other Ambulatory Visit

## 2024-03-07 ENCOUNTER — Encounter: Payer: Self-pay | Admitting: Nurse Practitioner

## 2024-03-07 ENCOUNTER — Other Ambulatory Visit

## 2024-03-07 DIAGNOSIS — E782 Mixed hyperlipidemia: Secondary | ICD-10-CM

## 2024-03-07 DIAGNOSIS — R7303 Prediabetes: Secondary | ICD-10-CM

## 2024-03-07 LAB — CMP14+EGFR
ALT: 24 [IU]/L (ref 0–32)
AST: 17 [IU]/L (ref 0–40)
Albumin: 4.8 g/dL (ref 3.9–4.9)
Alkaline Phosphatase: 76 [IU]/L (ref 41–116)
BUN/Creatinine Ratio: 11 (ref 9–23)
BUN: 10 mg/dL (ref 6–20)
Bilirubin Total: 0.2 mg/dL (ref 0.0–1.2)
CO2: 24 mmol/L (ref 20–29)
Calcium: 9.6 mg/dL (ref 8.7–10.2)
Chloride: 98 mmol/L (ref 96–106)
Creatinine, Ser: 0.9 mg/dL (ref 0.57–1.00)
Globulin, Total: 2.1 g/dL (ref 1.5–4.5)
Glucose: 105 mg/dL — ABNORMAL HIGH (ref 70–99)
Potassium: 3.8 mmol/L (ref 3.5–5.2)
Sodium: 138 mmol/L (ref 134–144)
Total Protein: 6.9 g/dL (ref 6.0–8.5)
eGFR: 86 mL/min/{1.73_m2}

## 2024-03-07 LAB — LIPID PANEL
Chol/HDL Ratio: 4.1 ratio (ref 0.0–4.4)
Cholesterol, Total: 122 mg/dL (ref 100–199)
HDL: 30 mg/dL — ABNORMAL LOW
LDL Chol Calc (NIH): 70 mg/dL (ref 0–99)
Triglycerides: 122 mg/dL (ref 0–149)
VLDL Cholesterol Cal: 22 mg/dL (ref 5–40)

## 2024-03-08 ENCOUNTER — Other Ambulatory Visit: Payer: Self-pay

## 2024-03-08 DIAGNOSIS — N926 Irregular menstruation, unspecified: Secondary | ICD-10-CM

## 2024-03-08 NOTE — Telephone Encounter (Signed)
 Attempted to contact patient will also send MyChart message.

## 2024-04-15 ENCOUNTER — Ambulatory Visit: Admitting: Nurse Practitioner
